# Patient Record
Sex: Male | Born: 1975 | Race: White | Hispanic: No | Smoking: Current every day smoker
Health system: Southern US, Community
[De-identification: ages and names within clinical notes are randomized; demographics above are authoritative.]

## PROBLEM LIST (undated history)

## (undated) ENCOUNTER — Emergency Department: Admission: EM | Payer: Self-pay | Source: Home / Self Care

## (undated) DIAGNOSIS — R51 Headache: Secondary | ICD-10-CM

## (undated) DIAGNOSIS — K089 Disorder of teeth and supporting structures, unspecified: Secondary | ICD-10-CM

## (undated) DIAGNOSIS — M25519 Pain in unspecified shoulder: Secondary | ICD-10-CM

## (undated) DIAGNOSIS — R519 Headache, unspecified: Secondary | ICD-10-CM

## (undated) DIAGNOSIS — G8929 Other chronic pain: Secondary | ICD-10-CM

## (undated) DIAGNOSIS — J45909 Unspecified asthma, uncomplicated: Secondary | ICD-10-CM

## (undated) DIAGNOSIS — F1910 Other psychoactive substance abuse, uncomplicated: Secondary | ICD-10-CM

## (undated) DIAGNOSIS — I2699 Other pulmonary embolism without acute cor pulmonale: Secondary | ICD-10-CM

## (undated) DIAGNOSIS — I82409 Acute embolism and thrombosis of unspecified deep veins of unspecified lower extremity: Secondary | ICD-10-CM

## (undated) DIAGNOSIS — M549 Dorsalgia, unspecified: Secondary | ICD-10-CM

## (undated) HISTORY — PX: TONSILLECTOMY: SUR1361

---

## 2004-10-05 ENCOUNTER — Ambulatory Visit: Payer: Self-pay | Admitting: Unknown Physician Specialty

## 2006-06-01 ENCOUNTER — Other Ambulatory Visit: Payer: Self-pay

## 2006-06-01 ENCOUNTER — Emergency Department: Payer: Self-pay | Admitting: Emergency Medicine

## 2006-06-22 ENCOUNTER — Ambulatory Visit: Payer: Self-pay | Admitting: Surgery

## 2008-08-20 ENCOUNTER — Emergency Department: Payer: Self-pay | Admitting: Emergency Medicine

## 2008-10-20 ENCOUNTER — Emergency Department: Payer: Self-pay | Admitting: Emergency Medicine

## 2008-10-25 ENCOUNTER — Emergency Department: Payer: Self-pay | Admitting: Internal Medicine

## 2009-12-07 ENCOUNTER — Emergency Department: Payer: Self-pay | Admitting: Emergency Medicine

## 2009-12-30 ENCOUNTER — Emergency Department: Payer: Self-pay | Admitting: Emergency Medicine

## 2010-04-02 ENCOUNTER — Emergency Department: Payer: Self-pay | Admitting: Emergency Medicine

## 2010-07-04 ENCOUNTER — Emergency Department: Payer: Self-pay | Admitting: Emergency Medicine

## 2010-07-29 ENCOUNTER — Emergency Department: Payer: Self-pay | Admitting: Emergency Medicine

## 2010-10-20 ENCOUNTER — Emergency Department: Payer: Self-pay | Admitting: Emergency Medicine

## 2010-12-25 ENCOUNTER — Emergency Department: Payer: Self-pay | Admitting: Emergency Medicine

## 2011-03-02 ENCOUNTER — Emergency Department: Payer: Self-pay | Admitting: Unknown Physician Specialty

## 2011-05-03 ENCOUNTER — Emergency Department: Payer: Self-pay | Admitting: Emergency Medicine

## 2011-07-16 ENCOUNTER — Emergency Department: Payer: Self-pay | Admitting: Internal Medicine

## 2011-08-14 ENCOUNTER — Emergency Department: Payer: Self-pay | Admitting: Emergency Medicine

## 2011-09-27 ENCOUNTER — Emergency Department: Payer: Self-pay | Admitting: *Deleted

## 2011-11-17 ENCOUNTER — Emergency Department: Payer: Self-pay | Admitting: Unknown Physician Specialty

## 2012-04-08 ENCOUNTER — Emergency Department (HOSPITAL_COMMUNITY): Payer: Medicaid Other

## 2012-04-08 ENCOUNTER — Emergency Department (HOSPITAL_COMMUNITY)
Admission: EM | Admit: 2012-04-08 | Discharge: 2012-04-08 | Disposition: A | Discharge status: Home / Self Care | Payer: Medicaid Other | Attending: Emergency Medicine | Admitting: Emergency Medicine

## 2012-04-08 ENCOUNTER — Encounter (HOSPITAL_COMMUNITY): Payer: Self-pay | Admitting: Emergency Medicine

## 2012-04-08 DIAGNOSIS — J45909 Unspecified asthma, uncomplicated: Secondary | ICD-10-CM | POA: Insufficient documentation

## 2012-04-08 DIAGNOSIS — F17200 Nicotine dependence, unspecified, uncomplicated: Secondary | ICD-10-CM | POA: Insufficient documentation

## 2012-04-08 DIAGNOSIS — R0789 Other chest pain: Secondary | ICD-10-CM

## 2012-04-08 DIAGNOSIS — R071 Chest pain on breathing: Secondary | ICD-10-CM | POA: Insufficient documentation

## 2012-04-08 HISTORY — DX: Unspecified asthma, uncomplicated: J45.909

## 2012-04-08 LAB — COMPREHENSIVE METABOLIC PANEL
ALT: 41 U/L (ref 0–53)
Alkaline Phosphatase: 144 U/L — ABNORMAL HIGH (ref 39–117)
BUN: 14 mg/dL (ref 6–23)
CO2: 27 mEq/L (ref 19–32)
Chloride: 104 mEq/L (ref 96–112)
GFR calc Af Amer: >90 mL/min (ref >90)
Glucose, Bld: 93 mg/dL (ref 70–99)
Potassium: 4 mEq/L (ref 3.5–5.1)
Sodium: 139 mEq/L (ref 135–145)
Total Bilirubin: 0.2 mg/dL — ABNORMAL LOW (ref 0.3–1.2)

## 2012-04-08 LAB — CBC
HCT: 42.7 % (ref 39.0–52.0)
Hemoglobin: 14.2 g/dL (ref 13.0–17.0)
RBC: 4.85 MIL/uL (ref 4.22–5.81)
WBC: 7.8 10*3/uL (ref 4.0–10.5)

## 2012-04-08 LAB — TROPONIN I: Troponin I: <0.3 ng/mL (ref <0.30)

## 2012-04-08 MED ORDER — PREDNISONE 50 MG PO TABS: 50.0000 mg | ORAL_TABLET | Freq: Every day | ORAL | Status: AC

## 2012-04-08 MED ORDER — DOXYCYCLINE HYCLATE 100 MG PO CAPS: 100.0000 mg | ORAL_CAPSULE | Freq: Two times a day (BID) | ORAL | Status: AC

## 2012-04-08 MED ORDER — OXYCODONE-ACETAMINOPHEN 5-325 MG PO TABS: 1.0000 | ORAL_TABLET | Freq: Four times a day (QID) | ORAL | Status: AC | PRN

## 2012-04-08 MED ORDER — HYDROMORPHONE HCL PF 2 MG/ML IJ SOLN
2.0000 mg | Freq: Once | INTRAMUSCULAR | Status: AC
  Administered 2012-04-08: 2 mg via INTRAMUSCULAR
  Filled 2012-04-08: qty 1

## 2012-04-08 NOTE — ED Provider Notes (Signed)
History  This chart was scribed for Donnetta Hutching, MD by Bennett Scrape. This patient was seen in room APA01/APA01 and the patient's care was started at 11:35AM.  CSN: [id number]  Arrival date & time 04/08/12  1128   First MD Initiated Contact with Patient 04/08/12 1135      Chief Complaint  Patient presents with  . Chest Pain    The history is provided by the patient. No language interpreter was used.    David Leach is a 36 y.o. male who presents to the Emergency Department complaining of 2 days of gradual onset, non-changing, constant mid sternal CP while he was trimming trees on his roof. He states that the pain started while was laying across his roof cutting tree limbs when he reached for a tree branch and another branch caught him in the sternum. The pain is non-radiating and is worse with deep breaths.  Pt reports that he broke his sternum falling out of deer stand 8 years ago and states that the pain he is experiencing now is similar to the pain he experienced then. He denies taking OTC medications at home to improve symptoms. He also c/o chronic SOB from asthma but denies changes and reports concern over possible contact with lyme disease infected ticks after he went fishing with son and removed several ticks from arms, legs and stomach 3 days ago. He denies fever, neck pain, visual disturbance, cough, abdominal pain, urinary symptoms, back pain, HA and rash as associated symptoms. He is a current everyday smoker but denies alcohol use.  Past Medical History  Diagnosis Date  . Asthma     Past Surgical History  Procedure Date  . Tonsillectomy     Family History  Problem Relation Age of Onset  . Cancer Mother   . Diabetes Father   . Cancer Other   . Stroke Other   . Diabetes Other     History  Substance Use Topics  . Smoking status: Current Everyday Smoker -- 1.0 packs/day for 20 years    Types: Cigarettes  . Smokeless tobacco: Former Neurosurgeon  . Alcohol Use: No    Administrator, sports, recently laid off   Review of Systems  A complete 10 system review of systems was obtained and all systems are negative except as noted in the HPI and PMH.   Allergies  Review of patient's allergies indicates no known allergies.  Home Medications  No current outpatient prescriptions on file.  Triage Vitals: BP 131/66  Pulse 84  Resp 18  SpO2 96%  Physical Exam  Nursing note and vitals reviewed. Constitutional: He is oriented to person, place, and time. He appears well-developed and well-nourished. No distress.  HENT:  Head: Normocephalic and atraumatic.  Eyes: Conjunctivae and EOM are normal.  Neck: Neck supple. No tracheal deviation present.  Cardiovascular: Normal rate and regular rhythm.   Pulmonary/Chest: Effort normal and breath sounds normal. No respiratory distress. He exhibits tenderness (moderate tenderness to mid sternum ).  Abdominal: Soft. There is no tenderness.  Musculoskeletal: Normal range of motion. He exhibits no edema.  Neurological: He is alert and oriented to person, place, and time.  Skin: Skin is warm and dry.       Multiple punctate erythematous areas to abdomen and bilateral forearms consistent with tick bites  Psychiatric: He has a normal mood and affect. His behavior is normal.    ED Course  Procedures (including critical care time)  DIAGNOSTIC STUDIES: Oxygen Saturation is 96% on  room air, adequate by my interpretation.    COORDINATION OF CARE: 11:50AM-Discussed treatment plan which includes a CXR, prednisone and pain medication with pt at bedside and pt agreed to plan. Advised pt that symptoms will take one week to improve. Discussed antibiotics for tick bites and pt agreed to plan.    Labs Reviewed  COMPREHENSIVE METABOLIC PANEL - Abnormal; Notable for the following:    Alkaline Phosphatase 144 (*)     Total Bilirubin 0.2 (*)     GFR calc non Af Amer 85 (*)     All other components within normal limits  CBC   TROPONIN I   Dg Chest 2 View  04/08/2012  *RADIOLOGY REPORT*  Clinical Data: Chest pain.  CHEST - 2 VIEW  Comparison: Chest x-ray 01/02/2012.  Findings: Lung volumes are normal.  No consolidative airspace disease.  No pleural effusions.  No pneumothorax.  No pulmonary nodule or mass noted.  Pulmonary vasculature and the cardiomediastinal silhouette are within normal limits.  IMPRESSION: 1. No radiographic evidence of acute cardiopulmonary disease.  Original Report Authenticated By: Florencia Reasons, M.D.     No diagnosis found.  Date: 04/08/2012  Rate:83  Rhythm: normal sinus rhythm  QRS Axis: normal  Intervals: normal  ST/T Wave abnormalities: normal  Conduction Disutrbances: none  Narrative Interpretation: unremarkable      MDM  History and physical consistent with chest wall pain. Patient is tender over the sternum.  Symptoms started directly after accident..  History and physical not consistent with acute coronary syndrome  I personally performed the services described in this documentation, which was scribed in my presence. The recorded information has been reviewed and considered.       Donnetta Hutching, MD 04/08/12 1350

## 2012-04-08 NOTE — ED Notes (Signed)
Patient c/o non-radiating mid-sternal chest pain that started 2 days ago while cutting limb from tree. Patient reports shortness of breath. Per patient fractured sternum bone 8 years ago after falling out of tree stand.

## 2012-05-21 ENCOUNTER — Emergency Department (HOSPITAL_COMMUNITY)
Admission: EM | Admit: 2012-05-21 | Discharge: 2012-05-22 | Disposition: A | Discharge status: Home / Self Care | Payer: Medicaid Other | Attending: Emergency Medicine | Admitting: Emergency Medicine

## 2012-05-21 ENCOUNTER — Encounter (HOSPITAL_COMMUNITY): Payer: Self-pay | Admitting: *Deleted

## 2012-05-21 DIAGNOSIS — W268XXA Contact with other sharp object(s), not elsewhere classified, initial encounter: Secondary | ICD-10-CM | POA: Insufficient documentation

## 2012-05-21 DIAGNOSIS — S71109A Unspecified open wound, unspecified thigh, initial encounter: Secondary | ICD-10-CM | POA: Insufficient documentation

## 2012-05-21 DIAGNOSIS — F17200 Nicotine dependence, unspecified, uncomplicated: Secondary | ICD-10-CM | POA: Insufficient documentation

## 2012-05-21 DIAGNOSIS — Y998 Other external cause status: Secondary | ICD-10-CM | POA: Insufficient documentation

## 2012-05-21 DIAGNOSIS — Y9389 Activity, other specified: Secondary | ICD-10-CM | POA: Insufficient documentation

## 2012-05-21 DIAGNOSIS — IMO0002 Reserved for concepts with insufficient information to code with codable children: Secondary | ICD-10-CM

## 2012-05-21 DIAGNOSIS — J45909 Unspecified asthma, uncomplicated: Secondary | ICD-10-CM | POA: Insufficient documentation

## 2012-05-21 DIAGNOSIS — S71009A Unspecified open wound, unspecified hip, initial encounter: Secondary | ICD-10-CM | POA: Insufficient documentation

## 2012-05-21 MED ORDER — OXYCODONE-ACETAMINOPHEN 5-325 MG PO TABS
2.0000 | ORAL_TABLET | Freq: Once | ORAL | Status: AC
  Administered 2012-05-22: 2 via ORAL
  Filled 2012-05-21: qty 2

## 2012-05-21 MED ORDER — LIDOCAINE HCL (PF) 1 % IJ SOLN
INTRAMUSCULAR | Status: AC
  Administered 2012-05-21: 5 mL
  Filled 2012-05-21: qty 5

## 2012-05-21 MED ORDER — LIDOCAINE HCL (PF) 1 % IJ SOLN
5.0000 mL | Freq: Once | INTRAMUSCULAR | Status: AC
  Administered 2012-05-21: 5 mL
  Filled 2012-05-21: qty 5

## 2012-05-21 MED ORDER — SULFAMETHOXAZOLE-TMP DS 800-160 MG PO TABS
1.0000 | ORAL_TABLET | Freq: Once | ORAL | Status: AC
  Administered 2012-05-22: 1 via ORAL
  Filled 2012-05-21: qty 1

## 2012-05-21 NOTE — ED Provider Notes (Signed)
History     CSN: [id number]  Arrival date & time 05/21/12  2243   First MD Initiated Contact with Patient 05/21/12 2256      Chief Complaint  Patient presents with  . Laceration    (Consider location/radiation/quality/duration/timing/severity/associated sxs/prior treatment) HPI Comments: Patient c/o laceration to his left upper leg that occurred when he accidentally dropped a tiller and one of the tines on the tiller cut his leg.  Denies persistent bleeding, edema or numbness/weakness of the leg.  Pain is worse with weight bearing .  Cleaned the area PTA.    Patient is a 36 y.o. male presenting with skin laceration. The history is provided by the patient.  Laceration  The incident occurred 1 to 2 hours ago. Pain location: left upper leg. The laceration is 2 cm in size. The laceration mechanism was a a metal edge. The pain is moderate. The pain has been constant since onset. He reports no foreign bodies present. His tetanus status is UTD.    Past Medical History  Diagnosis Date  . Asthma     Past Surgical History  Procedure Date  . Tonsillectomy     Family History  Problem Relation Age of Onset  . Cancer Mother   . Diabetes Father   . Cancer Other   . Stroke Other   . Diabetes Other     History  Substance Use Topics  . Smoking status: Current Every Day Smoker -- 1.0 packs/day for 20 years    Types: Cigarettes  . Smokeless tobacco: Former Neurosurgeon  . Alcohol Use: No      Review of Systems  Constitutional: Negative for fever and chills.  Musculoskeletal: Negative for back pain, joint swelling and arthralgias.  Skin: Positive for wound. Negative for color change.       Laceration   Neurological: Negative for dizziness, weakness and numbness.  Hematological: Does not bruise/bleed easily.  All other systems reviewed and are negative.    Allergies  Review of patient's allergies indicates no known allergies.  Home Medications   Current Outpatient Rx  Name Route  Sig Dispense Refill  . ALBUTEROL SULFATE HFA 108 (90 BASE) MCG/ACT IN AERS Inhalation Inhale 2 puffs into the lungs every 6 (six) hours as needed. For shortness of breath    . BC HEADACHE POWDER PO Oral Take 1 Package by mouth daily as needed. For pain    . IBUPROFEN 200 MG PO TABS Oral Take 800 mg by mouth every 6 (six) hours as needed. For pain      BP 131/59  Pulse 101  Temp 97.8 F (36.6 C)  Resp 20  Wt 200 lb (90.719 kg)  SpO2 96%  Physical Exam  Nursing note and vitals reviewed. Constitutional: He is oriented to person, place, and time. He appears well-developed and well-nourished. No distress.  HENT:  Head: Normocephalic and atraumatic.  Cardiovascular: Normal rate, regular rhythm and normal heart sounds.   Pulmonary/Chest: Effort normal and breath sounds normal.  Musculoskeletal: He exhibits no edema and no tenderness.       Left hip: He exhibits normal range of motion, normal strength, no tenderness, no bony tenderness, no swelling, no crepitus and no deformity.       Left knee: Normal.  Neurological: He is alert and oriented to person, place, and time. He exhibits normal muscle tone. Coordination normal.  Skin: Laceration noted.          Laceration to the left medial thigh.  Adipose tissue seen.  No obvious FB's, bleeding controlled.  No edema.  No obvious muscle, tendon or nerve injury seen or palpated.  Distal sensation intact, DP pulse brisk.  Pt has full ROM of the left leg.    ED Course  Procedures (including critical care time)  Labs Reviewed - No data to display      MDM    LACERATION REPAIR Performed by: Naketa Daddario L. Authorized by: Maxwell Caul Consent: Verbal consent obtained. Risks and benefits: risks, benefits and alternatives were discussed Consent given by: patient Patient identity confirmed: provided demographic data Prepped and Draped in normal sterile fashion Wound explored  Laceration Location:  Left medial thigh Laceration  Length: 2 cm  No Foreign Bodies seen or palpated  Anesthesia: local infiltration  Local anesthetic: lidocaine 1% w/o epinephrine  Anesthetic total: 5 ml  Irrigation method: syringe Amount of cleaning: standard  Skin closure: 4-0 prolene Number of sutures: 3  Technique: simple interrupted (loosely closed)  Patient tolerance: Patient tolerated the procedure well with no immediate complications.    Wound was thoroughly irrigated with saline and skin loosely approximated.  I have advised pt to return here in 1-2 days if any signs of infection.  Wound(s) explored with adequate hemostasis through ROM, no apparent gross foreign body retained, no significant involvement of deep structures such as bone / joint / tendon / or neurovascular involvement noted.  Baseline Strength and Sensation to affected extremity(ies) with normal light touch for Pt, distal NVI with CR< 2 secs and pulse(s) intact to affected extremity(ies).   The patient appears reasonably screened and/or stabilized for discharge and I doubt any other medical condition or other [facility] requiring further screening, evaluation, or treatment in the ED at this time prior to discharge.    Aliegha Paullin L. [place], [place] 05/23/12 2049

## 2012-05-21 NOTE — ED Notes (Signed)
Pt dropped a tiller on his leg. Pt has 1 inch laceration to left inner thigh.

## 2012-05-22 MED ORDER — OXYCODONE-ACETAMINOPHEN 5-325 MG PO TABS: 1.0000 | ORAL_TABLET | ORAL | Status: AC | PRN

## 2012-05-22 MED ORDER — SULFAMETHOXAZOLE-TRIMETHOPRIM 800-160 MG PO TABS: 1.0000 | ORAL_TABLET | Freq: Two times a day (BID) | ORAL | Status: DC

## 2012-05-22 NOTE — ED Notes (Signed)
Pt discharged. Pt stable at time of discharge. Medications reviewed pt has no questions regarding discharge at this time. Pt voiced understanding of discharge instructions.  

## 2012-05-24 NOTE — ED Provider Notes (Signed)
Medical screening examination/treatment/procedure(s) were performed by non-physician practitioner and as supervising physician I was immediately available for consultation/collaboration.  Donnetta Hutching, MD 05/24/12 [phone]

## 2012-08-07 ENCOUNTER — Encounter (HOSPITAL_COMMUNITY): Payer: Self-pay | Admitting: *Deleted

## 2012-08-07 ENCOUNTER — Emergency Department (HOSPITAL_COMMUNITY)
Admission: EM | Admit: 2012-08-07 | Discharge: 2012-08-07 | Disposition: A | Discharge status: Home / Self Care | Payer: Medicaid Other | Attending: Emergency Medicine | Admitting: Emergency Medicine

## 2012-08-07 ENCOUNTER — Emergency Department (HOSPITAL_COMMUNITY): Payer: Medicaid Other

## 2012-08-07 DIAGNOSIS — S161XXA Strain of muscle, fascia and tendon at neck level, initial encounter: Secondary | ICD-10-CM

## 2012-08-07 DIAGNOSIS — Y9389 Activity, other specified: Secondary | ICD-10-CM | POA: Insufficient documentation

## 2012-08-07 DIAGNOSIS — Y929 Unspecified place or not applicable: Secondary | ICD-10-CM | POA: Insufficient documentation

## 2012-08-07 DIAGNOSIS — S20219A Contusion of unspecified front wall of thorax, initial encounter: Secondary | ICD-10-CM

## 2012-08-07 DIAGNOSIS — W19XXXA Unspecified fall, initial encounter: Secondary | ICD-10-CM

## 2012-08-07 DIAGNOSIS — R296 Repeated falls: Secondary | ICD-10-CM | POA: Insufficient documentation

## 2012-08-07 DIAGNOSIS — J45909 Unspecified asthma, uncomplicated: Secondary | ICD-10-CM | POA: Insufficient documentation

## 2012-08-07 DIAGNOSIS — F17200 Nicotine dependence, unspecified, uncomplicated: Secondary | ICD-10-CM | POA: Insufficient documentation

## 2012-08-07 DIAGNOSIS — Z79899 Other long term (current) drug therapy: Secondary | ICD-10-CM | POA: Insufficient documentation

## 2012-08-07 DIAGNOSIS — S139XXA Sprain of joints and ligaments of unspecified parts of neck, initial encounter: Secondary | ICD-10-CM | POA: Insufficient documentation

## 2012-08-07 MED ORDER — OXYCODONE-ACETAMINOPHEN 5-325 MG PO TABS
2.0000 | ORAL_TABLET | Freq: Once | ORAL | Status: AC
  Administered 2012-08-07: 2 via ORAL
  Filled 2012-08-07: qty 2

## 2012-08-07 MED ORDER — NAPROXEN 500 MG PO TABS: 500.0000 mg | ORAL_TABLET | Freq: Two times a day (BID) | ORAL | Status: DC

## 2012-08-07 MED ORDER — KETOROLAC TROMETHAMINE 60 MG/2ML IM SOLN
60.0000 mg | Freq: Once | INTRAMUSCULAR | Status: AC
  Administered 2012-08-07: 60 mg via INTRAMUSCULAR
  Filled 2012-08-07: qty 2

## 2012-08-07 MED ORDER — CYCLOBENZAPRINE HCL 10 MG PO TABS: 10.0000 mg | ORAL_TABLET | Freq: Three times a day (TID) | ORAL | Status: DC | PRN

## 2012-08-07 MED ORDER — CYCLOBENZAPRINE HCL 10 MG PO TABS
10.0000 mg | ORAL_TABLET | Freq: Once | ORAL | Status: AC
  Administered 2012-08-07: 10 mg via ORAL
  Filled 2012-08-07: qty 1

## 2012-08-07 MED ORDER — OXYCODONE-ACETAMINOPHEN 5-325 MG PO TABS: 1.0000 | ORAL_TABLET | ORAL | Status: AC | PRN

## 2012-08-07 NOTE — ED Notes (Signed)
Pain upper back and neck , Fell when going up steps carrying a Christmas tree.  Larey Seat backwards and struck his back against  A corner.

## 2012-08-07 NOTE — ED Notes (Signed)
Pt states fell while bringing christmas tree in house falling into a 6 x 6 post striking rt shoulder and neck. Pt denies loc. NAD noted at this time.

## 2012-08-10 NOTE — ED Provider Notes (Signed)
History     CSN: [id number]  Arrival date & time 08/07/12  1718   First MD Initiated Contact with Patient 08/07/12 1831      Chief Complaint  Patient presents with  . Neck Pain    (Consider location/radiation/quality/duration/timing/severity/associated sxs/prior treatment) Patient is a 36 y.o. male presenting with neck pain. The history is provided by the patient.  Neck Pain  This is a new problem. The current episode started 3 to 5 hours ago. The problem occurs constantly. The problem has not changed since onset.The pain is associated with a fall, twisting and lifting a heavy object. There has been no fever. The pain is present in the right side. The quality of the pain is described as aching. The pain radiates to the right shoulder (right chest wall). The pain is moderate. The symptoms are aggravated by twisting, position and bending. Associated symptoms include chest pain. Pertinent negatives include no photophobia, no visual change, no syncope, no numbness, no weight loss, no headaches, no bowel incontinence, no bladder incontinence, no leg pain, no paresis, no tingling and no weakness. He has tried NSAIDs for the symptoms. The treatment provided no relief.    Past Medical History  Diagnosis Date  . Asthma     Past Surgical History  Procedure Date  . Tonsillectomy     Family History  Problem Relation Age of Onset  . Cancer Mother   . Diabetes Father   . Cancer Other   . Stroke Other   . Diabetes Other     History  Substance Use Topics  . Smoking status: Current Every Day Smoker -- 1.0 packs/day for 20 years    Types: Cigarettes  . Smokeless tobacco: Former Neurosurgeon  . Alcohol Use: No      Review of Systems  Constitutional: Negative for fever and weight loss.  HENT: Positive for neck pain. Negative for facial swelling.   Eyes: Negative for photophobia and visual disturbance.  Respiratory: Negative for shortness of breath.   Cardiovascular: Positive for chest pain.  Negative for syncope.  Gastrointestinal: Negative for vomiting, abdominal pain, constipation and bowel incontinence.  Genitourinary: Negative for bladder incontinence, dysuria, hematuria, flank pain, decreased urine volume and difficulty urinating.       No perineal numbness or incontinence of urine or feces  Musculoskeletal: Positive for back pain and arthralgias. Negative for joint swelling and gait problem.  Skin: Negative for rash.  Neurological: Negative for dizziness, tingling, weakness, numbness and headaches.  All other systems reviewed and are negative.    Allergies  Review of patient's allergies indicates no known allergies.  Home Medications   Current Outpatient Rx  Name  Route  Sig  Dispense  Refill  . ALBUTEROL SULFATE HFA 108 (90 BASE) MCG/ACT IN AERS   Inhalation   Inhale 2 puffs into the lungs every 6 (six) hours as needed. For shortness of breath         . BC HEADACHE POWDER PO   Oral   Take 1 Package by mouth daily as needed. For pain         . IBUPROFEN 200 MG PO TABS   Oral   Take 800 mg by mouth every 6 (six) hours as needed. For pain         . TRAMADOL HCL 50 MG PO TABS   Oral   Take 50 mg by mouth every 6 (six) hours as needed. For pain         . CYCLOBENZAPRINE HCL  10 MG PO TABS   Oral   Take 1 tablet (10 mg total) by mouth 3 (three) times daily as needed for muscle spasms.   21 tablet   0   . NAPROXEN 500 MG PO TABS   Oral   Take 1 tablet (500 mg total) by mouth 2 (two) times daily with a meal.   20 tablet   0   . OXYCODONE-ACETAMINOPHEN 5-325 MG PO TABS   Oral   Take 1 tablet by mouth every 4 (four) hours as needed for pain.   20 tablet   0     BP 124/91  Pulse 96  Temp 98.4 F (36.9 C) (Oral)  Resp 20  Ht 5\' 11"  (1.803 m)  Wt 205 lb (92.987 kg)  BMI 28.59 kg/m2  SpO2 100%  Physical Exam  Nursing note and vitals reviewed. Constitutional: He is oriented to person, place, and time. He appears well-developed and  well-nourished. No distress.  HENT:  Head: Normocephalic and atraumatic.  Mouth/Throat: Oropharynx is clear and moist.  Eyes: EOM are normal. Pupils are equal, round, and reactive to light.  Neck: Phonation normal. Neck supple. Muscular tenderness present. No spinous process tenderness present. No rigidity. Decreased range of motion present. No erythema present. No Brudzinski's sign and no Kernig's sign noted. No thyromegaly present.       ttp of the right cervical paraspinal muscles.  Grip strength is strong and equal bilaterally.  Sensation intact,  CR < 2 sec  Cardiovascular: Normal rate, regular rhythm, normal heart sounds and intact distal pulses.   No murmur heard. Pulmonary/Chest: Effort normal and breath sounds normal. No respiratory distress. He exhibits tenderness.       Mild ttp of the right lateral ribs.  No guarding, edema abrasions or crepitus  Abdominal: Soft. There is no tenderness.  Musculoskeletal: He exhibits tenderness. He exhibits no edema.       Right shoulder: He exhibits tenderness and pain. He exhibits normal range of motion, no bony tenderness, no swelling, no effusion, no crepitus, no deformity, no laceration, normal pulse and normal strength.       Cervical back: He exhibits tenderness. He exhibits normal range of motion, no bony tenderness, no swelling, no deformity, no spasm and normal pulse.       Arms:      ttp along border of the right scapula.  Pain reproduced with abduction of right arm.  Radial pulse brisk, distal sensation intact, grip strength strong and equal bilaterally  Lymphadenopathy:    He has no cervical adenopathy.  Neurological: He is alert and oriented to person, place, and time. He has normal strength. No cranial nerve deficit or sensory deficit. He exhibits normal muscle tone. Coordination normal.  Reflex Scores:      Tricep reflexes are 2+ on the right side and 2+ on the left side.      Bicep reflexes are 2+ on the right side and 2+ on the left  side. Skin: Skin is warm and dry.    ED Course  Procedures (including critical care time)  Labs Reviewed - No data to display No results found. Dg Ribs Unilateral W/chest Right  08/07/2012  *RADIOLOGY REPORT*  Clinical Data: Neck pain.  Fall.  Chest pain.  RIGHT RIBS AND CHEST - 3+ VIEW  Comparison: None.  Findings: No pneumothorax.  Cardiopericardial silhouette within normal limits. Mediastinal contours normal. Trachea midline.  No airspace disease or effusion.  No displaced rib fractures are identified. Please note  that plain film rib series have limited sensitivity for nondisplaced rib fractures.  If the patient continues to have pain, consider repeat examination in 1-2 weeks with marker over area of maximal tenderness.  Often periosteal reaction will be present at the site of occult rib fracture.  IMPRESSION: No acute abnormality.   Original Report Authenticated By: Andreas Newport, M.D.    Dg Cervical Spine Complete  08/07/2012  *RADIOLOGY REPORT*  Clinical Data:  neck pain.  CERVICAL SPINE - COMPLETE 4+ VIEW  Comparison: None.  Findings: The odontoid is intact.  Cervical spine is visualized from C1-C7.  The cervicothoracic junction is inadequately visualized.  Prevertebral soft tissues appear normal.  Mild degenerative disc disease at C6-C7.  Craniocervical alignment appears normal.  IMPRESSION: No acute osseous abnormality in the visualized cervical spine.  C6- C7 degenerative disc disease.  Cervicothoracic junction adequately visualized despite attempted swimmer's view.   Original Report Authenticated By: Andreas Newport, M.D.    Dg Scapula Right  08/07/2012  *RADIOLOGY REPORT*  Clinical Data: Neck pain.  Fall.  RIGHT SCAPULA - 2+ VIEWS  Comparison: 04/08/2012.  Findings: Right scapula intact.  No fracture.  Shoulder appears located.  AC joint appears within normal limits.  IMPRESSION: Negative.   Original Report Authenticated By: Andreas Newport, M.D.     1. Cervical strain   2. Chest wall  contusion   3. Fall       MDM     No focal neuro deficits.  Pt agrees to ice and close f/u with his PMD.    Prescribed: Flexeril Percocet #20 naprosyn     Baylon Santelli L. [place], [place] 08/10/12 2238

## 2012-08-11 NOTE — ED Provider Notes (Signed)
Medical screening examination/treatment/procedure(s) were performed by non-physician practitioner and as supervising physician I was immediately available for consultation/collaboration.  Donnetta Hutching, MD 08/11/12 [phone]

## 2012-08-19 ENCOUNTER — Emergency Department: Payer: Self-pay | Admitting: Internal Medicine

## 2012-08-19 LAB — CBC
HCT: 46.8 % (ref 40.0–52.0)
HGB: 16 g/dL (ref 13.0–18.0)
MCH: 30.5 pg (ref 26.0–34.0)
MCV: 89 fL (ref 80–100)
RBC: 5.25 10*6/uL (ref 4.40–5.90)
RDW: 14 % (ref 11.5–14.5)

## 2012-08-19 LAB — COMPREHENSIVE METABOLIC PANEL
Albumin: 3.9 g/dL (ref 3.4–5.0)
BUN: 11 mg/dL (ref 7–18)
Bilirubin,Total: 0.5 mg/dL (ref 0.2–1.0)
Calcium, Total: 8.8 mg/dL (ref 8.5–10.1)
Co2: 25 mmol/L (ref 21–32)
Creatinine: 1.36 mg/dL — ABNORMAL HIGH (ref 0.60–1.30)
EGFR (African American): >60
EGFR (Non-African Amer.): >60
Glucose: 97 mg/dL (ref 65–99)
Osmolality: 275 (ref 275–301)
Potassium: 3.9 mmol/L (ref 3.5–5.1)
Sodium: 138 mmol/L (ref 136–145)
Total Protein: 7.4 g/dL (ref 6.4–8.2)

## 2012-10-20 ENCOUNTER — Encounter (HOSPITAL_COMMUNITY): Payer: Self-pay | Admitting: Emergency Medicine

## 2012-10-20 ENCOUNTER — Emergency Department (HOSPITAL_COMMUNITY)
Admission: EM | Admit: 2012-10-20 | Discharge: 2012-10-20 | Disposition: A | Discharge status: Home / Self Care | Payer: Medicaid Other | Attending: Emergency Medicine | Admitting: Emergency Medicine

## 2012-10-20 ENCOUNTER — Emergency Department (HOSPITAL_COMMUNITY): Payer: Medicaid Other

## 2012-10-20 DIAGNOSIS — X500XXA Overexertion from strenuous movement or load, initial encounter: Secondary | ICD-10-CM | POA: Insufficient documentation

## 2012-10-20 DIAGNOSIS — IMO0002 Reserved for concepts with insufficient information to code with codable children: Secondary | ICD-10-CM | POA: Insufficient documentation

## 2012-10-20 DIAGNOSIS — F17200 Nicotine dependence, unspecified, uncomplicated: Secondary | ICD-10-CM | POA: Insufficient documentation

## 2012-10-20 DIAGNOSIS — Y92410 Unspecified street and highway as the place of occurrence of the external cause: Secondary | ICD-10-CM | POA: Insufficient documentation

## 2012-10-20 DIAGNOSIS — Y9389 Activity, other specified: Secondary | ICD-10-CM | POA: Insufficient documentation

## 2012-10-20 DIAGNOSIS — Z79899 Other long term (current) drug therapy: Secondary | ICD-10-CM | POA: Insufficient documentation

## 2012-10-20 DIAGNOSIS — Z7982 Long term (current) use of aspirin: Secondary | ICD-10-CM | POA: Insufficient documentation

## 2012-10-20 DIAGNOSIS — S93409A Sprain of unspecified ligament of unspecified ankle, initial encounter: Secondary | ICD-10-CM | POA: Insufficient documentation

## 2012-10-20 DIAGNOSIS — J45909 Unspecified asthma, uncomplicated: Secondary | ICD-10-CM | POA: Insufficient documentation

## 2012-10-20 MED ORDER — HYDROCODONE-ACETAMINOPHEN 5-325 MG PO TABS: 1.0000 | ORAL_TABLET | ORAL | Status: DC | PRN

## 2012-10-20 MED ORDER — HYDROCODONE-ACETAMINOPHEN 5-325 MG PO TABS
1.0000 | ORAL_TABLET | Freq: Once | ORAL | Status: AC
  Administered 2012-10-20: 1 via ORAL
  Filled 2012-10-20: qty 1

## 2012-10-20 NOTE — ED Provider Notes (Signed)
Medical screening examination/treatment/procedure(s) were performed by non-physician practitioner and as supervising physician I was immediately available for consultation/collaboration.   Benny Lennert, MD 10/20/12 2107
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
## 2012-11-10 ENCOUNTER — Emergency Department (HOSPITAL_COMMUNITY): Payer: Medicaid Other

## 2012-11-10 ENCOUNTER — Emergency Department (HOSPITAL_COMMUNITY)
Admission: EM | Admit: 2012-11-10 | Discharge: 2012-11-10 | Disposition: A | Discharge status: Home / Self Care | Payer: Medicaid Other | Attending: Emergency Medicine | Admitting: Emergency Medicine

## 2012-11-10 ENCOUNTER — Encounter (HOSPITAL_COMMUNITY): Payer: Self-pay | Admitting: *Deleted

## 2012-11-10 DIAGNOSIS — F17200 Nicotine dependence, unspecified, uncomplicated: Secondary | ICD-10-CM | POA: Insufficient documentation

## 2012-11-10 DIAGNOSIS — Z79899 Other long term (current) drug therapy: Secondary | ICD-10-CM | POA: Insufficient documentation

## 2012-11-10 DIAGNOSIS — R071 Chest pain on breathing: Secondary | ICD-10-CM | POA: Insufficient documentation

## 2012-11-10 DIAGNOSIS — R0789 Other chest pain: Secondary | ICD-10-CM

## 2012-11-10 DIAGNOSIS — J45901 Unspecified asthma with (acute) exacerbation: Secondary | ICD-10-CM | POA: Insufficient documentation

## 2012-11-10 DIAGNOSIS — R059 Cough, unspecified: Secondary | ICD-10-CM | POA: Insufficient documentation

## 2012-11-10 DIAGNOSIS — F419 Anxiety disorder, unspecified: Secondary | ICD-10-CM

## 2012-11-10 DIAGNOSIS — R05 Cough: Secondary | ICD-10-CM | POA: Insufficient documentation

## 2012-11-10 DIAGNOSIS — F411 Generalized anxiety disorder: Secondary | ICD-10-CM | POA: Insufficient documentation

## 2012-11-10 LAB — CBC WITH DIFFERENTIAL/PLATELET
Basophils Absolute: 0 10*3/uL (ref 0.0–0.1)
Basophils Relative: 0 % (ref 0–1)
HCT: 44.3 % (ref 39.0–52.0)
Hemoglobin: 15.1 g/dL (ref 13.0–17.0)
Lymphocytes Relative: 23 % (ref 12–46)
MCHC: 34.1 g/dL (ref 30.0–36.0)
Neutro Abs: 7.5 10*3/uL (ref 1.7–7.7)
Neutrophils Relative %: 63 % (ref 43–77)
RDW: 13.5 % (ref 11.5–15.5)
WBC: 11.9 10*3/uL — ABNORMAL HIGH (ref 4.0–10.5)

## 2012-11-10 LAB — D-DIMER, QUANTITATIVE: D-Dimer, Quant: <0.27 ug/mL-FEU (ref 0.00–0.48)

## 2012-11-10 LAB — BASIC METABOLIC PANEL
Chloride: 101 mEq/L (ref 96–112)
GFR calc Af Amer: 87 mL/min — ABNORMAL LOW (ref >90)
GFR calc non Af Amer: 75 mL/min — ABNORMAL LOW (ref >90)
Potassium: 3.6 mEq/L (ref 3.5–5.1)
Sodium: 139 mEq/L (ref 135–145)

## 2012-11-10 LAB — TROPONIN I: Troponin I: <0.3 ng/mL (ref <0.30)

## 2012-11-10 MED ORDER — OXYCODONE-ACETAMINOPHEN 5-325 MG PO TABS: ORAL_TABLET | ORAL | Status: DC

## 2012-11-10 MED ORDER — ALBUTEROL SULFATE (2.5 MG/3ML) 0.083% IN NEBU: 2.5000 mg | INHALATION_SOLUTION | RESPIRATORY_TRACT | Status: DC | PRN

## 2012-11-10 MED ORDER — IBUPROFEN 400 MG PO TABS
400.0000 mg | ORAL_TABLET | Freq: Once | ORAL | Status: AC
  Administered 2012-11-10: 400 mg via ORAL
  Filled 2012-11-10: qty 1

## 2012-11-10 MED ORDER — OXYCODONE-ACETAMINOPHEN 5-325 MG PO TABS
2.0000 | ORAL_TABLET | Freq: Once | ORAL | Status: AC
  Administered 2012-11-10: 2 via ORAL
  Filled 2012-11-10: qty 2

## 2012-11-10 MED ORDER — IPRATROPIUM BROMIDE 0.02 % IN SOLN
1.0000 mg | Freq: Once | RESPIRATORY_TRACT | Status: AC
  Administered 2012-11-10: 1 mg via RESPIRATORY_TRACT
  Filled 2012-11-10: qty 5

## 2012-11-10 MED ORDER — HYDROMORPHONE HCL PF 1 MG/ML IJ SOLN
1.0000 mg | Freq: Once | INTRAMUSCULAR | Status: AC
  Administered 2012-11-10: 1 mg via INTRAMUSCULAR
  Filled 2012-11-10: qty 1

## 2012-11-10 MED ORDER — PREDNISONE 10 MG PO TABS
60.0000 mg | ORAL_TABLET | Freq: Once | ORAL | Status: AC
  Administered 2012-11-10: 60 mg via ORAL
  Filled 2012-11-10: qty 1

## 2012-11-10 MED ORDER — PREDNISONE 10 MG PO TABS
ORAL_TABLET | ORAL | Status: AC
  Filled 2012-11-10: qty 1

## 2012-11-10 MED ORDER — LORAZEPAM 1 MG PO TABS: ORAL_TABLET | ORAL | Status: DC

## 2012-11-10 MED ORDER — LORAZEPAM 1 MG PO TABS
1.0000 mg | ORAL_TABLET | Freq: Once | ORAL | Status: AC
Start: 2012-11-10 — End: 2012-11-10
  Administered 2012-11-10: 1 mg via ORAL
  Filled 2012-11-10: qty 1

## 2012-11-10 MED ORDER — NAPROXEN 250 MG PO TABS: 250.0000 mg | ORAL_TABLET | Freq: Two times a day (BID) | ORAL | Status: DC

## 2012-11-10 MED ORDER — PREDNISONE 20 MG PO TABS: 40.0000 mg | ORAL_TABLET | Freq: Every day | ORAL | Status: DC

## 2012-11-10 MED ORDER — ALBUTEROL SULFATE (5 MG/ML) 0.5% IN NEBU
10.0000 mg | INHALATION_SOLUTION | Freq: Once | RESPIRATORY_TRACT | Status: AC
  Administered 2012-11-10: 10 mg via RESPIRATORY_TRACT
  Filled 2012-11-10: qty 2

## 2012-11-10 NOTE — ED Notes (Signed)
Left in c/o mother for transport home; instructions, prescriptions and f/u information given/reviewed - verbalizes understanding.  

## 2012-11-10 NOTE — ED Notes (Signed)
Pt states sharp pain to chest x 3 days. Nonproductive cough x 3 days. Pt also states he has been under a lot of stress. Pt states fingers to right hand began tingling last night and felt like he was going to pass out (while coughing).

## 2012-11-10 NOTE — ED Notes (Signed)
Alert, sitting on stretcher; in no apparent distress.  Reports chest pain 8/10. States, "that percocet took the edge off, but I'm still hurting.  Is she going to write me a prescription to go home with? Can she write me something stronger than percocet?".  Will notify EDP.
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
## 2012-11-10 NOTE — ED Notes (Signed)
Patient's family member to nurses station and reports that he is having "severe" chest pains, MD notified.

## 2013-01-27 ENCOUNTER — Encounter (HOSPITAL_COMMUNITY): Payer: Self-pay | Admitting: *Deleted

## 2013-01-27 ENCOUNTER — Emergency Department (HOSPITAL_COMMUNITY)
Admission: EM | Admit: 2013-01-27 | Discharge: 2013-01-28 | Disposition: A | Discharge status: Home / Self Care | Payer: Medicaid Other | Attending: Emergency Medicine | Admitting: Emergency Medicine

## 2013-01-27 DIAGNOSIS — R110 Nausea: Secondary | ICD-10-CM | POA: Insufficient documentation

## 2013-01-27 DIAGNOSIS — N5082 Scrotal pain: Secondary | ICD-10-CM

## 2013-01-27 DIAGNOSIS — R109 Unspecified abdominal pain: Secondary | ICD-10-CM | POA: Insufficient documentation

## 2013-01-27 DIAGNOSIS — M79609 Pain in unspecified limb: Secondary | ICD-10-CM | POA: Insufficient documentation

## 2013-01-27 DIAGNOSIS — J45909 Unspecified asthma, uncomplicated: Secondary | ICD-10-CM | POA: Insufficient documentation

## 2013-01-27 DIAGNOSIS — F17200 Nicotine dependence, unspecified, uncomplicated: Secondary | ICD-10-CM | POA: Insufficient documentation

## 2013-01-27 DIAGNOSIS — Z79899 Other long term (current) drug therapy: Secondary | ICD-10-CM | POA: Insufficient documentation

## 2013-01-27 DIAGNOSIS — N509 Disorder of male genital organs, unspecified: Secondary | ICD-10-CM | POA: Insufficient documentation

## 2013-01-27 LAB — BASIC METABOLIC PANEL
BUN: 15 mg/dL (ref 6–23)
CO2: 26 mEq/L (ref 19–32)
Calcium: 9.1 mg/dL (ref 8.4–10.5)
Chloride: 102 mEq/L (ref 96–112)
Creatinine, Ser: 1.26 mg/dL (ref 0.50–1.35)
GFR calc Af Amer: 84 mL/min — ABNORMAL LOW (ref >90)
GFR calc non Af Amer: 73 mL/min — ABNORMAL LOW (ref >90)
Glucose, Bld: 105 mg/dL — ABNORMAL HIGH (ref 70–99)
Potassium: 3.9 mEq/L (ref 3.5–5.1)
Sodium: 138 mEq/L (ref 135–145)

## 2013-01-27 LAB — CBC WITH DIFFERENTIAL/PLATELET
Basophils Absolute: 0.1 10*3/uL (ref 0.0–0.1)
Basophils Relative: 1 % (ref 0–1)
Eosinophils Absolute: 0.5 10*3/uL (ref 0.0–0.7)
Eosinophils Relative: 7 % — ABNORMAL HIGH (ref 0–5)
HCT: 41.4 % (ref 39.0–52.0)
Hemoglobin: 14.1 g/dL (ref 13.0–17.0)
Lymphocytes Relative: 57 % — ABNORMAL HIGH (ref 12–46)
Lymphs Abs: 4.2 10*3/uL — ABNORMAL HIGH (ref 0.7–4.0)
MCH: 29.7 pg (ref 26.0–34.0)
MCHC: 34.1 g/dL (ref 30.0–36.0)
MCV: 87.2 fL (ref 78.0–100.0)
Monocytes Absolute: 0.6 10*3/uL (ref 0.1–1.0)
Monocytes Relative: 8 % (ref 3–12)
Neutro Abs: 2 10*3/uL (ref 1.7–7.7)
Neutrophils Relative %: 27 % — ABNORMAL LOW (ref 43–77)
Platelets: 260 10*3/uL (ref 150–400)
RBC: 4.75 MIL/uL (ref 4.22–5.81)
RDW: 13 % (ref 11.5–15.5)
WBC: 7.5 10*3/uL (ref 4.0–10.5)

## 2013-01-27 LAB — URINALYSIS, ROUTINE W REFLEX MICROSCOPIC
Bilirubin Urine: NEGATIVE
Leukocytes, UA: NEGATIVE
Nitrite: NEGATIVE
Specific Gravity, Urine: 1.01 (ref 1.005–1.030)
Urobilinogen, UA: 0.2 mg/dL (ref 0.0–1.0)
pH: 6 (ref 5.0–8.0)

## 2013-01-27 MED ORDER — ONDANSETRON 8 MG PO TBDP
8.0000 mg | ORAL_TABLET | Freq: Once | ORAL | Status: AC
  Administered 2013-01-27: 8 mg via ORAL
  Filled 2013-01-27: qty 1

## 2013-01-27 MED ORDER — IBUPROFEN 800 MG PO TABS
800.0000 mg | ORAL_TABLET | Freq: Once | ORAL | Status: AC
  Administered 2013-01-27: 800 mg via ORAL
  Filled 2013-01-27: qty 1

## 2013-01-27 MED ORDER — OXYCODONE-ACETAMINOPHEN 5-325 MG PO TABS
1.0000 | ORAL_TABLET | Freq: Once | ORAL | Status: AC
  Administered 2013-01-27: 1 via ORAL
  Filled 2013-01-27: qty 1

## 2013-01-27 MED ORDER — HYDROMORPHONE HCL PF 1 MG/ML IJ SOLN
2.0000 mg | Freq: Once | INTRAMUSCULAR | Status: AC
  Administered 2013-01-27: 2 mg via INTRAMUSCULAR
  Filled 2013-01-27: qty 2

## 2013-01-27 NOTE — ED Notes (Signed)
Pt presents with left testicular pain x 3 weeks that is progressively worsening. Pt states pain radiates from groin into teste and down lt leg.  Denies difficulty voiding.

## 2013-01-27 NOTE — ED Notes (Signed)
Pt c/o left sided groin pain radiating to left testicle down left leg. X 3wks.

## 2013-01-27 NOTE — ED Provider Notes (Signed)
History  This chart was scribed for David Booze, MD by Bennett Scrape, ED Scribe. This patient was seen in room [facility]10/[facility]10 and the patient's care was started at 9:35 PM.  CSN: [id number]  Arrival date & time 01/27/13  1949   First MD Initiated Contact with Patient 01/27/13 2135      Chief Complaint  Patient presents with  . Testicle Pain  . Groin Pain  . Leg Pain     The history is provided by the patient. No language interpreter was used.    HPI Comments: David Leach is a 37 y.o. male who presents to the Emergency Department complaining of 3 weeks of constant left testicle pain described as a "sharp cramp" that radiates into his LLQ, lower back and down left leg with associated nausea. He rates his pain a 10 out of 10 currently and states that it feels like "it's drawing up". He reports that prolonged standing and ambulation aggravates his pain and he denies having any alleviating factors. He reports that he has tried ice, heat, ibuprofen, BC powders and tylenol with no improvement. Pt denies having prior episodes of similar symptoms. He denies swelling, chills, fevers, sweats and urinary symptoms as associated symptoms. He has a h/o asthma and is a current everyday smoker but denies alcohol use.   Past Medical History  Diagnosis Date  . Asthma     Past Surgical History  Procedure Laterality Date  . Tonsillectomy      Family History  Problem Relation Age of Onset  . Cancer Mother   . Diabetes Father   . Cancer Other   . Stroke Other   . Diabetes Other     History  Substance Use Topics  . Smoking status: Current Every Day Smoker -- 0.50 packs/day for 20 years    Types: Cigarettes  . Smokeless tobacco: Former Neurosurgeon  . Alcohol Use: No      Review of Systems  Constitutional: Negative for fever and chills.  Gastrointestinal: Positive for nausea. Negative for vomiting.  Genitourinary: Positive for testicular pain. Negative for dysuria, frequency and scrotal  swelling.  All other systems reviewed and are negative.    Allergies  Review of patient's allergies indicates no known allergies.  Home Medications   Current Outpatient Rx  Name  Route  Sig  Dispense  Refill  . albuterol (PROVENTIL HFA;VENTOLIN HFA) 108 (90 BASE) MCG/ACT inhaler   Inhalation   Inhale 2 puffs into the lungs every 6 (six) hours as needed. For shortness of breath         . albuterol (PROVENTIL) (2.5 MG/3ML) 0.083% nebulizer solution   Nebulization   Take 3 mLs (2.5 mg total) by nebulization every 4 (four) hours as needed for wheezing.   75 mL   0   . Aspirin-Salicylamide-Caffeine (BC HEADACHE POWDER PO)   Oral   Take 1 Package by mouth daily as needed. For pain         . ibuprofen (ADVIL,MOTRIN) 200 MG tablet   Oral   Take 800 mg by mouth every 6 (six) hours as needed. For pain         . LORazepam (ATIVAN) 1 MG tablet      1 tab PO BID prn anxiety   4 tablet   0   . naproxen (NAPROSYN) 250 MG tablet   Oral   Take 1 tablet (250 mg total) by mouth 2 (two) times daily with a meal.   14 tablet   0   .  oxyCODONE-acetaminophen (PERCOCET/ROXICET) 5-325 MG per tablet      1 or 2 tabs PO q6h prn pain   20 tablet   0   . predniSONE (DELTASONE) 20 MG tablet   Oral   Take 2 tablets (40 mg total) by mouth daily.   10 tablet   0     Triage Vitals: Pulse 83  Temp(Src) 98.2 F (36.8 C) (Oral)  Resp 20  Ht 5\' 11"  (1.803 m)  Wt 205 lb (92.987 kg)  BMI 28.6 kg/m2  SpO2 98%  Physical Exam  Nursing note and vitals reviewed. Constitutional: He is oriented to person, place, and time. He appears well-developed and well-nourished. No distress.  HENT:  Head: Normocephalic and atraumatic.  Eyes: Conjunctivae and EOM are normal.  Neck: Neck supple. No tracheal deviation present.  Cardiovascular: Normal rate and regular rhythm.   Pulmonary/Chest: Effort normal and breath sounds normal. No respiratory distress.  Abdominal: Soft. There is no tenderness.   Genitourinary:  circumcised penis, testes descended and non-tender, no swelling or induration, no inguinal hernias, moderate tenderness to palpation of the left inguinal canal, no mass felt, no inguinal adenopathy   Musculoskeletal: Normal range of motion. He exhibits no edema.  Neurological: He is alert and oriented to person, place, and time.  Skin: Skin is warm and dry.  Psychiatric: He has a normal mood and affect. His behavior is normal.    ED Course  Procedures (including critical care time)  Medications  ondansetron (ZOFRAN-ODT) disintegrating tablet 8 mg (not administered)  oxyCODONE-acetaminophen (PERCOCET/ROXICET) 5-325 MG per tablet 1 tablet (not administered)  ibuprofen (ADVIL,MOTRIN) tablet 800 mg (not administered)    DIAGNOSTIC STUDIES: Oxygen Saturation is 98% on room air, normal by my interpretation.    COORDINATION OF CARE: 9:41 PM-Addressed smoking cessation. Informed pt that I don't believe that his symptoms are indicative of an inguinal hernia, torsion or epididymitis. Discussed treatment plan which includes medications, CBC panel, BMP and UA with pt at bedside and pt agreed to plan. Will provide a referral if work up is normal.  Results for orders placed during the hospital encounter of 01/27/13  URINALYSIS, ROUTINE W REFLEX MICROSCOPIC      Result Value Range   Color, Urine YELLOW  YELLOW   APPearance CLEAR  CLEAR   Specific Gravity, Urine 1.010  1.005 - 1.030   pH 6.0  5.0 - 8.0   Glucose, UA NEGATIVE  NEGATIVE mg/dL   Hgb urine dipstick NEGATIVE  NEGATIVE   Bilirubin Urine NEGATIVE  NEGATIVE   Ketones, ur NEGATIVE  NEGATIVE mg/dL   Protein, ur NEGATIVE  NEGATIVE mg/dL   Urobilinogen, UA 0.2  0.0 - 1.0 mg/dL   Nitrite NEGATIVE  NEGATIVE   Leukocytes, UA NEGATIVE  NEGATIVE  CBC WITH DIFFERENTIAL      Result Value Range   WBC 7.5  4.0 - 10.5 K/uL   RBC 4.75  4.22 - 5.81 MIL/uL   Hemoglobin 14.1  13.0 - 17.0 g/dL   HCT 16.1  09.6 - 04.5 %   MCV 87.2   78.0 - 100.0 fL   MCH 29.7  26.0 - 34.0 pg   MCHC 34.1  30.0 - 36.0 g/dL   RDW 40.9  81.1 - 91.4 %   Platelets 260  150 - 400 K/uL   Neutrophils Relative % 27 (*) 43 - 77 %   Neutro Abs 2.0  1.7 - 7.7 K/uL   Lymphocytes Relative 57 (*) 12 - 46 %   Lymphs  Abs 4.2 (*) 0.7 - 4.0 K/uL   Monocytes Relative 8  3 - 12 %   Monocytes Absolute 0.6  0.1 - 1.0 K/uL   Eosinophils Relative 7 (*) 0 - 5 %   Eosinophils Absolute 0.5  0.0 - 0.7 K/uL   Basophils Relative 1  0 - 1 %   Basophils Absolute 0.1  0.0 - 0.1 K/uL  BASIC METABOLIC PANEL      Result Value Range   Sodium 138  135 - 145 mEq/L   Potassium 3.9  3.5 - 5.1 mEq/L   Chloride 102  96 - 112 mEq/L   CO2 26  19 - 32 mEq/L   Glucose, Bld 105 (*) 70 - 99 mg/dL   BUN 15  6 - 23 mg/dL   Creatinine, Ser 4.54  0.50 - 1.35 mg/dL   Calcium 9.1  8.4 - 09.8 mg/dL   GFR calc non Af Amer 73 (*) >90 mL/min   GFR calc Af Amer 84 (*) >90 mL/min     1. Scrotal pain       MDM  Scrotal pain of uncertain cause. Clinically, this is not torsion and does not epididymitis. It has been present for several weeks and there is no testicular swelling or tenderness. I cannot feel a hernia. It is possible that this is pain from the varicocele and she will be sent for scrotal ultrasound for that. Ultrasound is not available today. He will be brought back tomorrow for the ultrasound. Pain was controlled in the ED with him ibuprofen and Percocet which gave limited relief of pain and he got better relief with a dose of hydromorphone. He is sent home with prescriptions for Percocet and naproxen and is to return tomorrow for ultrasound and he is referred to urology for followup.  I personally performed the services described in this documentation, which was scribed in my presence. The recorded information has been reviewed and is accurate.         David Booze, MD 01/28/13 [phone]

## 2013-01-28 ENCOUNTER — Other Ambulatory Visit (HOSPITAL_COMMUNITY): Payer: Medicaid Other

## 2013-01-28 ENCOUNTER — Emergency Department (HOSPITAL_COMMUNITY): Payer: Medicaid Other

## 2013-01-28 MED ORDER — NAPROXEN 500 MG PO TABS: 500.0000 mg | ORAL_TABLET | Freq: Two times a day (BID) | ORAL | Status: DC

## 2013-01-28 MED ORDER — OXYCODONE-ACETAMINOPHEN 5-325 MG PO TABS: 1.0000 | ORAL_TABLET | ORAL | Status: DC | PRN

## 2013-01-28 MED ORDER — OXYCODONE-ACETAMINOPHEN 7.5-325 MG PO TABS: 1.0000 | ORAL_TABLET | ORAL | Status: DC | PRN

## 2013-01-28 NOTE — ED Notes (Addendum)
Pt getting scrotal US tonight

## 2013-01-28 NOTE — ED Notes (Signed)
Pt left without signing 

## 2013-01-28 NOTE — ED Provider Notes (Signed)
0100 Assumed care/disposition of patient. He has testicular pain. He awaits [place].  67 [place] is negative for acute injury. He has a hydrocele. Reviewed results with the patient. D/C home with referral to Dr. Jerre Simon.  US Scrotum  01/28/2013   *RADIOLOGY REPORT*  Clinical Data: Testicular and groin pain  ULTRASOUND OF SCROTUM  Technique:  Complete ultrasound examination of the testicles, epididymis, and other scrotal structures was performed.  Comparison:  None.  Findings:  Right testis:  Measures 3.6 x 2.3 x 2.3 cm.  Color Doppler flow with arterial and venous wave forms documented.  Left testis:  Measures 3.1 x 1.8 x 2.6 cm.  Small punctate calcification versus focus of fat.  No focal lesion otherwise. Color Doppler flow with arterial and venous wave forms documented bilaterally.  Right epididymis:  Normal in size and appearance.  Left epididymis:  Normal in size and appearance.  Hydrocele:  Small, right greater than left.  Varicocele:  Absent  IMPRESSION: Color Doppler flow with arterial and venous wave forms documented to normal echogenicity testicles bilaterally.  Small hydroceles.   Original Report Authenticated By: Jearld Lesch, M.D.   [place] Art/ven Flow Abd Pelv Doppler  01/28/2013   *RADIOLOGY REPORT*  Clinical Data: Testicular and groin pain  ULTRASOUND OF SCROTUM  Technique:  Complete ultrasound examination of the testicles, epididymis, and other scrotal structures was performed.  Comparison:  None.  Findings:  Right testis:  Measures 3.6 x 2.3 x 2.3 cm.  Color Doppler flow with arterial and venous wave forms documented.  Left testis:  Measures 3.1 x 1.8 x 2.6 cm.  Small punctate calcification versus focus of fat.  No focal lesion otherwise. Color Doppler flow with arterial and venous wave forms documented bilaterally.  Right epididymis:  Normal in size and appearance.  Left epididymis:  Normal in size and appearance.  Hydrocele:  Small, right greater than left.  Varicocele:  Absent  IMPRESSION: Color  Doppler flow with arterial and venous wave forms documented to normal echogenicity testicles bilaterally.  Small hydroceles.   Original Report Authenticated By: Jearld Lesch, M.D.    Nicoletta Dress. Colon Branch, MD 01/28/13 [phone]

## 2013-01-29 MED FILL — Oxycodone w/ Acetaminophen Tab 5-325 MG: ORAL | Qty: 6 | Status: AC

## 2013-02-07 ENCOUNTER — Emergency Department (HOSPITAL_COMMUNITY)
Admission: EM | Admit: 2013-02-07 | Discharge: 2013-02-08 | Disposition: A | Discharge status: Home / Self Care | Payer: Medicaid Other | Attending: Emergency Medicine | Admitting: Emergency Medicine

## 2013-02-07 ENCOUNTER — Encounter (HOSPITAL_COMMUNITY): Payer: Self-pay

## 2013-02-07 DIAGNOSIS — Z79899 Other long term (current) drug therapy: Secondary | ICD-10-CM | POA: Insufficient documentation

## 2013-02-07 DIAGNOSIS — G8929 Other chronic pain: Secondary | ICD-10-CM | POA: Insufficient documentation

## 2013-02-07 DIAGNOSIS — N509 Disorder of male genital organs, unspecified: Secondary | ICD-10-CM | POA: Insufficient documentation

## 2013-02-07 DIAGNOSIS — F17200 Nicotine dependence, unspecified, uncomplicated: Secondary | ICD-10-CM | POA: Insufficient documentation

## 2013-02-07 DIAGNOSIS — J45909 Unspecified asthma, uncomplicated: Secondary | ICD-10-CM | POA: Insufficient documentation

## 2013-02-07 DIAGNOSIS — N5082 Scrotal pain: Secondary | ICD-10-CM

## 2013-02-07 NOTE — ED Notes (Signed)
Pt seen here a few weeks ago for groin pain, had [place] for same, told to follow up with urologist for which he has appt for Monday, states is very painful and needs something for pain before then.

## 2013-02-08 MED ORDER — OXYCODONE-ACETAMINOPHEN 7.5-325 MG PO TABS: 1.0000 | ORAL_TABLET | ORAL | Status: DC | PRN

## 2013-02-08 MED ORDER — OXYCODONE-ACETAMINOPHEN 5-325 MG PO TABS: 2.0000 | ORAL_TABLET | ORAL | Status: DC | PRN

## 2013-02-08 MED ORDER — OXYCODONE-ACETAMINOPHEN 5-325 MG PO TABS: 1.0000 | ORAL_TABLET | ORAL | Status: DC | PRN

## 2013-02-08 MED ORDER — HYDROMORPHONE HCL PF 1 MG/ML IJ SOLN
INTRAMUSCULAR | Status: AC
  Filled 2013-02-08: qty 1

## 2013-02-08 MED ORDER — HYDROMORPHONE HCL PF 1 MG/ML IJ SOLN
1.0000 mg | Freq: Once | INTRAMUSCULAR | Status: AC
  Administered 2013-02-08: 1 mg via INTRAMUSCULAR

## 2013-02-08 MED ORDER — OXYCODONE-ACETAMINOPHEN 5-325 MG PO TABS
2.0000 | ORAL_TABLET | Freq: Once | ORAL | Status: AC
  Administered 2013-02-08: 2 via ORAL
  Filled 2013-02-08: qty 2

## 2013-02-08 NOTE — ED Provider Notes (Signed)
History     CSN: [id number]  Arrival date & time 02/07/13  2316   First MD Initiated Contact with Patient 02/07/13 2330      Chief Complaint  Patient presents with  . Groin Pain    (Consider location/radiation/quality/duration/timing/severity/associated sxs/prior treatment) HPI Comments: David Leach is a 37 y.o. Male presenting with left groin and scrotal pain which has been persistent since he was seen here for this same complaint 10 days ago, stating he has a chronic sharp cramp in the left testicle which radiates into his left lower pelvis and his left upper thigh and is worsened by movement,  Lifted and prolonged standing.  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  He denies swelling in his scrotum and pelvis.  He reports a "pulling"  sensation as if his left testicle is pulling into his left pelvis.  He has tried both ice heat, anti-inflammatories without relief.  He was prescribed Percocet as last visit here which was helpful but he is out of this medication.  He is scheduled to see Dr. Jesse Fall in 5 days for further evaluation.  His had no fevers or chills, denies dysuria or hematuria, has had no penile discharge.  He has found no alleviators for his symptoms.    The history is provided by the patient and the spouse.    Past Medical History  Diagnosis Date  . Asthma     Past Surgical History  Procedure Laterality Date  . Tonsillectomy      Family History  Problem Relation Age of Onset  . Cancer Mother   . Diabetes Father   . Cancer Other   . Stroke Other   . Diabetes Other     History  Substance Use Topics  . Smoking status: Current Every Day Smoker -- 0.50 packs/day for 20 years    Types: Cigarettes  . Smokeless tobacco: Former Neurosurgeon  . Alcohol Use: No      Review of Systems  Constitutional: Negative for fever.  HENT: Negative for congestion, sore  throat and neck pain.   Eyes: Negative.   Respiratory: Negative for chest tightness and shortness of breath.   Cardiovascular: Negative for chest pain.  Gastrointestinal: Negative for nausea and abdominal pain.  Genitourinary: Positive for testicular pain. Negative for dysuria, hematuria, flank pain, discharge, scrotal swelling, difficulty urinating and penile pain.  Musculoskeletal: Negative for joint swelling and arthralgias.  Skin: Negative.  Negative for rash and wound.  Neurological: Negative for dizziness, weakness, light-headedness, numbness and headaches.  Psychiatric/Behavioral: Negative.     Allergies  Review of patient's allergies indicates no known allergies.  Home Medications   Current Outpatient Rx  Name  Route  Sig  Dispense  Refill  . acetaminophen (TYLENOL) 500 MG tablet   Oral   Take 500 mg by mouth every 6 (six) hours as needed for pain.         Marland Kitchen albuterol (PROVENTIL HFA;VENTOLIN HFA) 108 (90 BASE) MCG/ACT inhaler   Inhalation   Inhale 2 puffs into the lungs every 6 (six) hours as needed. For shortness of breath         . albuterol (PROVENTIL) (2.5 MG/3ML) 0.083% nebulizer solution   Nebulization   Take 3 mLs (2.5 mg total) by nebulization every 4 (four) hours as needed for wheezing.   75 mL   0   . Aspirin-Salicylamide-Caffeine (  BC HEADACHE POWDER PO)   Oral   Take 1 packet by mouth daily as needed. For pain         . ibuprofen (ADVIL,MOTRIN) 200 MG tablet   Oral   Take 800 mg by mouth every 6 (six) hours as needed. For pain         . naproxen (NAPROSYN) 500 MG tablet   Oral   Take 1 tablet (500 mg total) by mouth 2 (two) times daily.   30 tablet   0   . oxyCODONE-acetaminophen (PERCOCET) 7.5-325 MG per tablet   Oral   Take 1 tablet by mouth every 4 (four) hours as needed for pain.   20 tablet   0   . oxyCODONE-acetaminophen (PERCOCET) 7.5-325 MG per tablet   Oral   Take 1 tablet by mouth every 4 (four) hours as needed for pain.    30 tablet   0   . oxyCODONE-acetaminophen (PERCOCET/ROXICET) 5-325 MG per tablet   Oral   Take 1 tablet by mouth every 4 (four) hours as needed for pain.   6 tablet   0     BP 154/79  Pulse 89  Temp(Src) 99.2 F (37.3 C) (Oral)  Resp 16  Ht 6\' 1"  (1.854 m)  Wt 200 lb (90.719 kg)  BMI 26.39 kg/m2  SpO2 100%  Physical Exam  Nursing note and vitals reviewed. Constitutional: He appears well-developed and well-nourished.  HENT:  Head: Normocephalic and atraumatic.  Eyes: Conjunctivae are normal.  Neck: Normal range of motion.  Cardiovascular: Normal rate, regular rhythm, normal heart sounds and intact distal pulses.   Pulmonary/Chest: Effort normal and breath sounds normal. He has no wheezes.  Abdominal: Soft. Bowel sounds are normal. He exhibits no distension and no mass. There is no tenderness. There is no guarding.  Genitourinary: Penis normal. Circumcised. No penile tenderness.  Testes descended, left is present, more high riding than right.  Nontender.  No direct or indirect hernia appreciated.  He is tender at his left inguinal canal and left suprapubic area.  No inguinal adenopathy present.  Musculoskeletal: Normal range of motion.  Neurological: He is alert.  Skin: Skin is warm and dry.  Psychiatric: He has a normal mood and affect.    ED Course  Procedures (including critical care time)  Labs Reviewed - No data to display No results found.   1. Scrotal pain       MDM  Ultrasound which was completed at his last visit here was reviewed, small bilateral hydroceles present otherwise normal study.  Patient with now chronic left scrotal pain as he reports his symptoms actually started early May, with normal ultrasound at recent visit.  No concerning findings on physical exam.  He was prescribed Percocet for pain, encouraged to keep his appointment with Dr. Jesse Fall on Monday.  In the interim he was given a work note for lighter duty.  Encouraged rest, no prolonged  standing as this seems to exacerbate his pain.  Return here immediately for any worsened symptoms, nausea vomiting fevers or swelling.        Burgess Amor, PA-C 02/08/13 [phone]

## 2013-02-08 NOTE — ED Provider Notes (Signed)
Medical screening examination/treatment/procedure(s) were performed by non-physician practitioner and as supervising physician I was immediately available for consultation/collaboration.  Nicoletta Dress. Colon Branch, MD 02/08/13 1610

## 2013-02-13 ENCOUNTER — Emergency Department (HOSPITAL_COMMUNITY)
Admission: EM | Admit: 2013-02-13 | Discharge: 2013-02-13 | Disposition: A | Discharge status: Home / Self Care | Payer: Medicaid Other | Attending: Emergency Medicine | Admitting: Emergency Medicine

## 2013-02-13 ENCOUNTER — Encounter (HOSPITAL_COMMUNITY): Payer: Self-pay | Admitting: Emergency Medicine

## 2013-02-13 DIAGNOSIS — R1030 Lower abdominal pain, unspecified: Secondary | ICD-10-CM

## 2013-02-13 DIAGNOSIS — F17200 Nicotine dependence, unspecified, uncomplicated: Secondary | ICD-10-CM | POA: Insufficient documentation

## 2013-02-13 DIAGNOSIS — Z79899 Other long term (current) drug therapy: Secondary | ICD-10-CM | POA: Insufficient documentation

## 2013-02-13 DIAGNOSIS — J45909 Unspecified asthma, uncomplicated: Secondary | ICD-10-CM | POA: Insufficient documentation

## 2013-02-13 DIAGNOSIS — N509 Disorder of male genital organs, unspecified: Secondary | ICD-10-CM | POA: Insufficient documentation

## 2013-02-13 DIAGNOSIS — R109 Unspecified abdominal pain: Secondary | ICD-10-CM | POA: Insufficient documentation

## 2013-02-13 DIAGNOSIS — R110 Nausea: Secondary | ICD-10-CM | POA: Insufficient documentation

## 2013-02-13 MED ORDER — OXYCODONE-ACETAMINOPHEN 5-325 MG PO TABS: 1.0000 | ORAL_TABLET | Freq: Four times a day (QID) | ORAL | Status: DC | PRN

## 2013-02-13 MED ORDER — OXYCODONE-ACETAMINOPHEN 5-325 MG PO TABS
1.0000 | ORAL_TABLET | Freq: Once | ORAL | Status: AC
  Administered 2013-02-13: 1 via ORAL
  Filled 2013-02-13: qty 1

## 2013-02-13 NOTE — ED Notes (Signed)
Pt here for pain control for left hernia. Pt states he has had it for 2 months and is set up with a Careers adviser in [place].

## 2013-02-13 NOTE — ED Notes (Addendum)
Pt with left hernia pain for 2 months, awaiting to here from surgeon from [facility] to set up an appt for his "multiple small hernias" to left groin, has ran out of percocet since seen for it before

## 2013-02-13 NOTE — ED Notes (Signed)
EDP made aware of pt's request for something for pain 

## 2013-02-13 NOTE — ED Notes (Signed)
Pt provided with Dr. Ortencia Kick office number and address to follow-up with

## 2013-02-13 NOTE — ED Provider Notes (Signed)
History  This chart was scribed for [employer]. Rubin Payor, MD by Bennett Scrape, ED Scribe. This patient was seen in room APA11/APA11 and the patient's care was started at 3:46 PM.  CSN: [id number]  Arrival date & time 02/13/13  1525   First MD Initiated Contact with Patient 02/13/13 1546      Chief Complaint  Patient presents with  . Hernia     The history is provided by the patient. No language interpreter was used.    HPI Comments: RAMADAN COUEY is a 37 y.o. male who presents to the Emergency Department requesting pain control for a left inguinal hernia diagnosed by ultrasound 2 months ago. He states that he is awaiting to here back from a surgeon at [facility] to set up an appointment. He states that he has recently run out of his percocet prescription, last refilled during his last ED visit 6 days ago. He reports associated nausea but denies emesis and fevers as associated symptoms. He has a h/o asthma and is a current everyday smoker and occasional alcohol user.  Past Medical History  Diagnosis Date  . Asthma     Past Surgical History  Procedure Laterality Date  . Tonsillectomy      Family History  Problem Relation Age of Onset  . Cancer Mother   . Diabetes Father   . Cancer Other   . Stroke Other   . Diabetes Other     History  Substance Use Topics  . Smoking status: Current Every Day Smoker -- 0.50 packs/day for 20 years    Types: Cigarettes  . Smokeless tobacco: Former Neurosurgeon  . Alcohol Use: Yes      Review of Systems  Constitutional: Negative for fever.  Gastrointestinal: Positive for nausea. Negative for vomiting.  Genitourinary: Positive for testicular pain (and groin pain). Negative for dysuria.  All other systems reviewed and are negative.    Allergies  Review of patient's allergies indicates no known allergies.  Home Medications   Current Outpatient Rx  Name  Route  Sig  Dispense  Refill  . acetaminophen (TYLENOL) 500 MG tablet   Oral   Take 500 mg by mouth every 6 (six) hours as needed for pain.         Marland Kitchen albuterol (PROVENTIL HFA;VENTOLIN HFA) 108 (90 BASE) MCG/ACT inhaler   Inhalation   Inhale 2 puffs into the lungs every 6 (six) hours as needed. For shortness of breath         . albuterol (PROVENTIL) (2.5 MG/3ML) 0.083% nebulizer solution   Nebulization   Take 3 mLs (2.5 mg total) by nebulization every 4 (four) hours as needed for wheezing.   75 mL   0   . Aspirin-Salicylamide-Caffeine (BC HEADACHE POWDER PO)   Oral   Take 1 packet by mouth daily as needed. For pain         . ibuprofen (ADVIL,MOTRIN) 200 MG tablet   Oral   Take 800 mg by mouth every 6 (six) hours as needed. For pain         . naproxen (NAPROSYN) 500 MG tablet   Oral   Take 1 tablet (500 mg total) by mouth 2 (two) times daily.   30 tablet   0   . oxyCODONE-acetaminophen (PERCOCET) 7.5-325 MG per tablet   Oral   Take 1 tablet by mouth every 4 (four) hours as needed for pain.   20 tablet   0   . oxyCODONE-acetaminophen (PERCOCET) 7.5-325 MG per  tablet   Oral   Take 1 tablet by mouth every 4 (four) hours as needed for pain.   30 tablet   0   . oxyCODONE-acetaminophen (PERCOCET/ROXICET) 5-325 MG per tablet   Oral   Take 1 tablet by mouth every 4 (four) hours as needed for pain.   6 tablet   0   . oxyCODONE-acetaminophen (PERCOCET/ROXICET) 5-325 MG per tablet   Oral   Take 2 tablets by mouth every 4 (four) hours as needed for pain.   6 tablet   0   . oxyCODONE-acetaminophen (PERCOCET/ROXICET) 5-325 MG per tablet   Oral   Take 1-2 tablets by mouth every 6 (six) hours as needed for pain.   10 tablet   0     Triage Vitals: BP 129/82  Pulse 99  Temp(Src) 98.7 F (37.1 C)  Resp 18  Ht 5\' 11"  (1.803 m)  Wt 205 lb (92.987 kg)  BMI 28.6 kg/m2  SpO2 97%  Physical Exam  Nursing note and vitals reviewed. Constitutional: He is oriented to person, place, and time. He appears well-developed and well-nourished. No  distress.  HENT:  Head: Normocephalic and atraumatic.  Eyes: Conjunctivae and EOM are normal.  Neck: Neck supple. No tracheal deviation present.  Cardiovascular: Normal rate and regular rhythm.   Pulmonary/Chest: Effort normal and breath sounds normal. No respiratory distress.  Abdominal: Soft. There is no tenderness.  Genitourinary:  Left inguinal tenderness, mild left testicular tenderness, no masses, no induration  Musculoskeletal: Normal range of motion.  Neurological: He is alert and oriented to person, place, and time.  Skin: Skin is warm and dry.  Psychiatric: He has a normal mood and affect. His behavior is normal.    ED Course  Procedures (including critical care time)  DIAGNOSTIC STUDIES: Oxygen Saturation is 97% on room air, normal by my interpretation.    COORDINATION OF CARE: 4:07 PM-Discussed treatment plan which includes review of prior records with pt at bedside and pt agreed to plan.   Labs Reviewed - No data to display No results found.   1. Inguinal pain       MDM  Patient with left inguinal testicle pain. His been seen in the ED previous for it and diagnosed with possible hydrocele. Was supposed to followup with urology yesterday, but went to [facility] on Sunday. Patient's records were reviewed CT was negative. Ultrasound showed possible inguinal fat hernias. Patient was given pain medicines but states he used to walk. Patient given a few more medicines here. He states he has followup. He'll be given no more pain medicines through the ER for this.      I personally performed the services described in this documentation, which was scribed in my presence. The recorded information has been reviewed and is accurate.     Juliet Rude. Rubin Payor, MD 02/13/13 1625

## 2013-02-21 MED FILL — Oxycodone w/ Acetaminophen Tab 5-325 MG: ORAL | Qty: 6 | Status: AC

## 2013-03-08 ENCOUNTER — Other Ambulatory Visit (HOSPITAL_COMMUNITY): Payer: Self-pay | Admitting: General Surgery

## 2013-03-08 DIAGNOSIS — R52 Pain, unspecified: Secondary | ICD-10-CM

## 2013-03-12 ENCOUNTER — Ambulatory Visit (HOSPITAL_COMMUNITY): Payer: Medicaid Other

## 2013-03-13 ENCOUNTER — Ambulatory Visit (HOSPITAL_COMMUNITY): Admission: RE | Admit: 2013-03-13 | Payer: Medicaid Other | Source: Ambulatory Visit

## 2013-03-14 ENCOUNTER — Encounter (HOSPITAL_COMMUNITY): Payer: Self-pay | Admitting: *Deleted

## 2013-03-14 ENCOUNTER — Emergency Department (HOSPITAL_COMMUNITY)
Admission: EM | Admit: 2013-03-14 | Discharge: 2013-03-14 | Disposition: A | Discharge status: Home / Self Care | Payer: Medicaid Other | Attending: Emergency Medicine | Admitting: Emergency Medicine

## 2013-03-14 DIAGNOSIS — K089 Disorder of teeth and supporting structures, unspecified: Secondary | ICD-10-CM | POA: Insufficient documentation

## 2013-03-14 DIAGNOSIS — J45909 Unspecified asthma, uncomplicated: Secondary | ICD-10-CM | POA: Insufficient documentation

## 2013-03-14 DIAGNOSIS — K0889 Other specified disorders of teeth and supporting structures: Secondary | ICD-10-CM

## 2013-03-14 DIAGNOSIS — F17200 Nicotine dependence, unspecified, uncomplicated: Secondary | ICD-10-CM | POA: Insufficient documentation

## 2013-03-14 DIAGNOSIS — K047 Periapical abscess without sinus: Secondary | ICD-10-CM | POA: Insufficient documentation

## 2013-03-14 MED ORDER — HYDROCODONE-ACETAMINOPHEN 5-325 MG PO TABS: 1.0000 | ORAL_TABLET | ORAL | Status: DC | PRN

## 2013-03-14 MED ORDER — AMOXICILLIN 250 MG PO CAPS
500.0000 mg | ORAL_CAPSULE | Freq: Once | ORAL | Status: AC
  Administered 2013-03-14: 500 mg via ORAL
  Filled 2013-03-14: qty 2

## 2013-03-14 MED ORDER — HYDROCODONE-ACETAMINOPHEN 5-325 MG PO TABS
1.0000 | ORAL_TABLET | Freq: Once | ORAL | Status: AC
  Administered 2013-03-14: 1 via ORAL
  Filled 2013-03-14: qty 1

## 2013-03-14 MED ORDER — AMOXICILLIN 500 MG PO CAPS: 500.0000 mg | ORAL_CAPSULE | Freq: Three times a day (TID) | ORAL | Status: AC

## 2013-03-14 NOTE — ED Notes (Signed)
Pt has a tooth extracted from lt side of mandible 1 week  Ago. Cont to have pain, Can't get an appt with dentist til next week.  Pts rt  Pupil is larger than lt, says it has been like that for over 2 years and was supposed to have it evaluated., but has not.

## 2013-03-14 NOTE — ED Provider Notes (Signed)
History    CSN: [id number] Arrival date & time 03/14/13  1738  First MD Initiated Contact with Patient 03/14/13 1743     Chief Complaint  Patient presents with  . Dental Pain   (Consider location/radiation/quality/duration/timing/severity/associated sxs/prior Treatment) HPI Comments: David Leach is a 37 y.o. Male with dental pain, now swelling along his lower anterior jaw and lip since he had his left lateral incisor extracted last week by a dentist in [place].  He cannot get in to see him in followup until next week.   There has been no fevers,  Chills, nausea or vomiting, also no complaint of difficulty swallowing,  Although chewing makes pain worse.  The patient has tried ibuprofen after his prescription percocet ran out without relief of symptoms.         The history is provided by the patient.   Past Medical History  Diagnosis Date  . Asthma    Past Surgical History  Procedure Laterality Date  . Tonsillectomy     Family History  Problem Relation Age of Onset  . Cancer Mother   . Diabetes Father   . Cancer Other   . Stroke Other   . Diabetes Other    History  Substance Use Topics  . Smoking status: Current Every Day Smoker -- 0.50 packs/day for 20 years    Types: Cigarettes  . Smokeless tobacco: Former Neurosurgeon  . Alcohol Use: Yes    Review of Systems  Constitutional: Negative for fever.  HENT: Positive for dental problem. Negative for sore throat, facial swelling, neck pain and neck stiffness.   Respiratory: Negative for shortness of breath.     Allergies  Review of patient's allergies indicates no known allergies.  Home Medications   Current Outpatient Rx  Name  Route  Sig  Dispense  Refill  . albuterol (PROVENTIL HFA;VENTOLIN HFA) 108 (90 BASE) MCG/ACT inhaler   Inhalation   Inhale 2 puffs into the lungs every 6 (six) hours as needed. For shortness of breath         . acetaminophen (TYLENOL) 500 MG tablet   Oral   Take 500 mg by mouth  every 6 (six) hours as needed for pain.         Marland Kitchen amoxicillin (AMOXIL) 500 MG capsule   Oral   Take 1 capsule (500 mg total) by mouth 3 (three) times daily.   30 capsule   0   . Aspirin-Salicylamide-Caffeine (BC HEADACHE POWDER PO)   Oral   Take 1 packet by mouth daily as needed. For pain         . HYDROcodone-acetaminophen (NORCO/VICODIN) 5-325 MG per tablet   Oral   Take 1 tablet by mouth every 4 (four) hours as needed for pain.   15 tablet   0   . ibuprofen (ADVIL,MOTRIN) 200 MG tablet   Oral   Take 800 mg by mouth every 6 (six) hours as needed. For pain          BP 112/76  Pulse 81  Temp(Src) 98.3 F (36.8 C) (Oral)  Resp 17  Ht 5\' 11"  (1.803 m)  Wt 205 lb (92.987 kg)  BMI 28.6 kg/m2  SpO2 100% Physical Exam  Constitutional: He is oriented to person, place, and time. He appears well-developed and well-nourished. No distress.  HENT:  Head: Normocephalic and atraumatic. No trismus in the jaw.  Right Ear: Tympanic membrane and external ear normal.  Left Ear: Tympanic membrane and external ear normal.  Mouth/Throat:  Oropharynx is clear and moist and mucous membranes are normal. No oral lesions. Dental abscesses present.  Upper edentulous, only lower teeth remaining and central incisors and right lateral incisors and premolar.  Left central incisor with gingival edema and erythema,  No fluctuance.  ttp submental node,  Floor of mouth is soft.  Eyes: Conjunctivae are normal.  Neck: Normal range of motion. Neck supple.  Cardiovascular: Normal rate and normal heart sounds.   Pulmonary/Chest: Effort normal.  Abdominal: He exhibits no distension.  Musculoskeletal: Normal range of motion.  Lymphadenopathy:    He has no cervical adenopathy.  Neurological: He is alert and oriented to person, place, and time.  Skin: Skin is warm and dry. No erythema.  Psychiatric: He has a normal mood and affect.    ED Course  Procedures (including critical care time) Labs Reviewed  - No data to display No results found. 1. Pain, dental     MDM  Hydrocodone,  Amoxil , encouraged f/u by dentist as scheduled next week.  The patient appears reasonably screened and/or stabilized for discharge and I doubt any other medical condition or other [facility] requiring further screening, evaluation, or treatment in the ED at this time prior to discharge.   Burgess Amor, PA-C 03/14/13 [phone]

## 2013-03-14 NOTE — ED Provider Notes (Signed)
Medical screening examination/treatment/procedure(s) were performed by non-physician practitioner and as supervising physician I was immediately available for consultation/collaboration.   Rosellen Lichtenberger L Evanna Washinton, MD 03/14/13 1924 

## 2013-03-14 NOTE — ED Notes (Signed)
Pt had tooth extraction last week. States pain from procedure still. Pt states dentist gave him eight 7.5 percocet and he is now out.

## 2013-03-16 ENCOUNTER — Emergency Department: Payer: Self-pay | Admitting: Emergency Medicine

## 2013-04-23 ENCOUNTER — Emergency Department (HOSPITAL_COMMUNITY): Payer: Medicaid Other

## 2013-04-23 ENCOUNTER — Emergency Department (HOSPITAL_COMMUNITY)
Admission: EM | Admit: 2013-04-23 | Discharge: 2013-04-23 | Disposition: A | Discharge status: Home / Self Care | Payer: Medicaid Other | Attending: Emergency Medicine | Admitting: Emergency Medicine

## 2013-04-23 ENCOUNTER — Encounter (HOSPITAL_COMMUNITY): Payer: Self-pay | Admitting: *Deleted

## 2013-04-23 DIAGNOSIS — Y9389 Activity, other specified: Secondary | ICD-10-CM | POA: Insufficient documentation

## 2013-04-23 DIAGNOSIS — Z87828 Personal history of other (healed) physical injury and trauma: Secondary | ICD-10-CM | POA: Insufficient documentation

## 2013-04-23 DIAGNOSIS — S0990XA Unspecified injury of head, initial encounter: Secondary | ICD-10-CM

## 2013-04-23 DIAGNOSIS — J45909 Unspecified asthma, uncomplicated: Secondary | ICD-10-CM | POA: Insufficient documentation

## 2013-04-23 DIAGNOSIS — F17200 Nicotine dependence, unspecified, uncomplicated: Secondary | ICD-10-CM | POA: Insufficient documentation

## 2013-04-23 DIAGNOSIS — Y990 Civilian activity done for income or pay: Secondary | ICD-10-CM | POA: Insufficient documentation

## 2013-04-23 DIAGNOSIS — S0993XA Unspecified injury of face, initial encounter: Secondary | ICD-10-CM | POA: Insufficient documentation

## 2013-04-23 DIAGNOSIS — IMO0002 Reserved for concepts with insufficient information to code with codable children: Secondary | ICD-10-CM | POA: Insufficient documentation

## 2013-04-23 DIAGNOSIS — Y9289 Other specified places as the place of occurrence of the external cause: Secondary | ICD-10-CM | POA: Insufficient documentation

## 2013-04-23 DIAGNOSIS — W1789XA Other fall from one level to another, initial encounter: Secondary | ICD-10-CM | POA: Insufficient documentation

## 2013-04-23 DIAGNOSIS — Z79899 Other long term (current) drug therapy: Secondary | ICD-10-CM | POA: Insufficient documentation

## 2013-04-23 MED ORDER — IBUPROFEN 800 MG PO TABS: 800.0000 mg | ORAL_TABLET | Freq: Three times a day (TID) | ORAL | Status: DC

## 2013-04-23 MED ORDER — OXYCODONE-ACETAMINOPHEN 5-325 MG PO TABS
1.0000 | ORAL_TABLET | Freq: Once | ORAL | Status: AC
  Administered 2013-04-23: 1 via ORAL
  Filled 2013-04-23: qty 1

## 2013-04-23 MED ORDER — HYDROCODONE-ACETAMINOPHEN 5-325 MG PO TABS: 1.0000 | ORAL_TABLET | Freq: Four times a day (QID) | ORAL | Status: DC | PRN

## 2013-04-23 NOTE — ED Notes (Signed)
Fell app 4 ft off ladder.  Struck top of head, neck hurts , back pain ?LOC Sl nausea, no vomiting

## 2013-04-23 NOTE — ED Provider Notes (Signed)
CSN: [id number]     Arrival date & time 04/23/13  1820 History  This chart was scribed for Benny Lennert, MD, by Frederik Pear, ED scribe. The patient was seen in room APA04/APA04 and the patient's care was started at 1830.  First MD Initiated Contact with Patient 04/23/13 1830     Chief Complaint  Patient presents with  . Fall   (Consider location/radiation/quality/duration/timing/severity/associated sxs/prior Treatment) Patient is a 37 y.o. male presenting with fall. The history is provided by the patient and medical records. No language interpreter was used.  Fall This is a new problem. The current episode started 6 to 12 hours ago. The problem occurs constantly. The problem has not changed since onset.Associated symptoms include headaches. Pertinent negatives include no chest pain and no abdominal pain. Treatments tried: applying a c-collar.   HPI Comments: David Leach is a 37 y.o. male who presents to the Emergency Department complaining of a fall from a 4 ft ladder that occurred at 0830 this morning while he was at work. He reports he fell backward and hit his head on scaffolding. He reports questionable syncope for a few seconds and a persistent HA since the fall. He denies paresthesia, CP, and abdominal pain. He reports a h/o of a fall several years ago when he fell 15 ft from a deer stand and injured his neck and head.  Tetanus is UTD.   Past Medical History  Diagnosis Date  . Asthma    Past Surgical History  Procedure Laterality Date  . Tonsillectomy     Family History  Problem Relation Age of Onset  . Cancer Mother   . Diabetes Father   . Cancer Other   . Stroke Other   . Diabetes Other    History  Substance Use Topics  . Smoking status: Current Every Day Smoker -- 0.50 packs/day for 20 years    Types: Cigarettes  . Smokeless tobacco: Former Neurosurgeon  . Alcohol Use: Yes    Review of Systems  Constitutional: Negative for appetite change and fatigue.  HENT:  Positive for neck pain. Negative for congestion, sinus pressure and ear discharge.   Eyes: Negative for discharge.  Respiratory: Negative for cough.   Cardiovascular: Negative for chest pain.  Gastrointestinal: Negative for abdominal pain and diarrhea.  Genitourinary: Negative for frequency and hematuria.  Musculoskeletal: Positive for back pain.  Skin: Negative for rash.  Neurological: Positive for headaches. Negative for seizures and numbness.  Psychiatric/Behavioral: Negative for hallucinations.    Allergies  Review of patient's allergies indicates no known allergies.  Home Medications   Current Outpatient Rx  Name  Route  Sig  Dispense  Refill  . acetaminophen (TYLENOL) 500 MG tablet   Oral   Take 500 mg by mouth every 6 (six) hours as needed for pain.         Marland Kitchen albuterol (PROVENTIL HFA;VENTOLIN HFA) 108 (90 BASE) MCG/ACT inhaler   Inhalation   Inhale 2 puffs into the lungs every 6 (six) hours as needed. For shortness of breath         . Aspirin-Salicylamide-Caffeine (BC HEADACHE POWDER PO)   Oral   Take 1 packet by mouth daily as needed. For pain         . HYDROcodone-acetaminophen (NORCO/VICODIN) 5-325 MG per tablet   Oral   Take 1 tablet by mouth every 4 (four) hours as needed for pain.   15 tablet   0   . ibuprofen (ADVIL,MOTRIN) 200 MG tablet  Oral   Take 800 mg by mouth every 6 (six) hours as needed. For pain          BP 129/83  Pulse 88  Temp(Src) 98.4 F (36.9 C) (Oral)  Resp 20  Ht 5\' 11"  (1.803 m)  Wt 205 lb (92.987 kg)  BMI 28.6 kg/m2  SpO2 99% Physical Exam  Nursing note and vitals reviewed. Constitutional: He is oriented to person, place, and time. He appears well-developed. Cervical collar in place.  HENT:  Minor tenderness and 0.5 cm laceration to occiput.   Eyes: Conjunctivae and EOM are normal. No scleral icterus.  Neck: Neck supple. Muscular tenderness present. No thyromegaly present.  Minor tenderness to posterior neck.   Cardiovascular: Normal rate and regular rhythm.  Exam reveals no gallop and no friction rub.   No murmur heard. Pulmonary/Chest: No stridor. He has no wheezes. He has no rales. He exhibits no tenderness.  Abdominal: He exhibits no distension. There is no tenderness. There is no rebound.  Musculoskeletal: Normal range of motion. He exhibits tenderness. He exhibits no edema.  Minor tenderness to lumbar spine.   Lymphadenopathy:    He has no cervical adenopathy.  Neurological: He is oriented to person, place, and time. Coordination normal.  Skin: No rash noted. No erythema.  Psychiatric: He has a normal mood and affect. His behavior is normal.   ED Course  Procedures (including critical care time)  DIAGNOSTIC STUDIES: Oxygen Saturation is 99% on room air, normal by my interpretation.    COORDINATION OF CARE:  18:50- Discussed planned course of treatment in the ED with the patient, including a head CT without contrast, cervical CT without contrast, and lumbar spine X-ray, who is agreeable at this time.  19:48- Recheck- Ordered Percocet for pain control. 1-view lumbar spine X-ray, cervical spine CT, and head CT independently read by radiologist and independently reviewed by Benny Lennert, MD. Discussed the impression and discharge plan with the pt including following up with a PCP if symptoms persist and pain control medication. He is agreeable at this time.   Labs Review Labs Reviewed - No data to display Imaging Review Dg Lumbar Spine Complete  04/23/2013   *RADIOLOGY REPORT*  Clinical Data: Low back pain after falling from ladder  LUMBAR SPINE - COMPLETE 4+ VIEW  Comparison: CT abdomen pelvis - 01/02/2012  Findings:  There are five non-rib bearing lumbar type vertebral bodies.  Very minimal scoliotic curvature of the inferior aspect of the thoracic spine, possibly positional.  No anterolisthesis or retrolisthesis.  Lumbar vertebral body heights are preserved.  Intervertebral disc spaces  are preserved.  Limited visualization of the bilateral SI joints and hips is normal.  Regional bowel gas pattern and soft tissues are normal.  IMPRESSION: No acute findings.   Original Report Authenticated By: Tacey Ruiz, MD   Ct Head Wo Contrast  04/23/2013   CLINICAL DATA:  Fall, hit head. Neck pain, headache.  EXAM: CT HEAD WITHOUT CONTRAST  CT CERVICAL SPINE WITHOUT CONTRAST  TECHNIQUE: Multidetector CT imaging of the head and cervical spine was performed following the standard protocol without intravenous contrast. Multiplanar CT image reconstructions of the cervical spine were also generated.  COMPARISON:  CT head 07/16/2010  FINDINGS: CT HEAD FINDINGS  No acute intracranial abnormality. Specifically, no hemorrhage, hydrocephalus, mass lesion, acute infarction, or significant intracranial injury. No acute calvarial abnormality.  Mild mucosal thickening in the paranasal sinuses. Mastoids are clear.  CT CERVICAL SPINE FINDINGS  Normal alignment. Prevertebral soft  tissues are normal. No fracture. Disc spaces are maintained. No epidural or paraspinal hematoma.  IMPRESSION: CT HEAD IMPRESSION  No intracranial abnormality  CT CERVICAL SPINE IMPRESSION  No bony abnormality   Electronically Signed   By: Charlett Nose   On: 04/23/2013 19:28   Ct Cervical Spine Wo Contrast  04/23/2013   CLINICAL DATA:  Fall, hit head. Neck pain, headache.  EXAM: CT HEAD WITHOUT CONTRAST  CT CERVICAL SPINE WITHOUT CONTRAST  TECHNIQUE: Multidetector CT imaging of the head and cervical spine was performed following the standard protocol without intravenous contrast. Multiplanar CT image reconstructions of the cervical spine were also generated.  COMPARISON:  CT head 07/16/2010  FINDINGS: CT HEAD FINDINGS  No acute intracranial abnormality. Specifically, no hemorrhage, hydrocephalus, mass lesion, acute infarction, or significant intracranial injury. No acute calvarial abnormality.  Mild mucosal thickening in the paranasal sinuses.  Mastoids are clear.  CT CERVICAL SPINE FINDINGS  Normal alignment. Prevertebral soft tissues are normal. No fracture. Disc spaces are maintained. No epidural or paraspinal hematoma.  IMPRESSION: CT HEAD IMPRESSION  No intracranial abnormality  CT CERVICAL SPINE IMPRESSION  No bony abnormality   Electronically Signed   By: Charlett Nose   On: 04/23/2013 19:28    MDM  No diagnosis found.  The chart was scribed for me under my direct supervision.  I personally performed the history, physical, and medical decision making and all procedures in the evaluation of this patient.Benny Lennert, MD 04/23/13 [phone]

## 2013-04-23 NOTE — ED Notes (Signed)
Pt alert & oriented x4, stable gait. Patient given discharge instructions, paperwork & prescription(s). Patient  instructed to stop at the registration desk to finish any additional paperwork. Patient verbalized understanding. Pt left department w/ no further questions. 

## 2013-07-04 ENCOUNTER — Emergency Department (HOSPITAL_COMMUNITY): Payer: Medicaid Other

## 2013-07-04 ENCOUNTER — Encounter (HOSPITAL_COMMUNITY): Payer: Self-pay | Admitting: Emergency Medicine

## 2013-07-04 ENCOUNTER — Emergency Department (HOSPITAL_COMMUNITY)
Admission: EM | Admit: 2013-07-04 | Discharge: 2013-07-04 | Disposition: A | Discharge status: Home / Self Care | Payer: Medicaid Other | Attending: Emergency Medicine | Admitting: Emergency Medicine

## 2013-07-04 DIAGNOSIS — Z791 Long term (current) use of non-steroidal anti-inflammatories (NSAID): Secondary | ICD-10-CM | POA: Insufficient documentation

## 2013-07-04 DIAGNOSIS — J45909 Unspecified asthma, uncomplicated: Secondary | ICD-10-CM | POA: Insufficient documentation

## 2013-07-04 DIAGNOSIS — Z79899 Other long term (current) drug therapy: Secondary | ICD-10-CM | POA: Insufficient documentation

## 2013-07-04 DIAGNOSIS — F17200 Nicotine dependence, unspecified, uncomplicated: Secondary | ICD-10-CM | POA: Insufficient documentation

## 2013-07-04 DIAGNOSIS — G43909 Migraine, unspecified, not intractable, without status migrainosus: Secondary | ICD-10-CM | POA: Insufficient documentation

## 2013-07-04 LAB — PROTIME-INR
INR: 1 (ref 0.00–1.49)
Prothrombin Time: 13 seconds (ref 11.6–15.2)

## 2013-07-04 LAB — BASIC METABOLIC PANEL
CO2: 31 mEq/L (ref 19–32)
Calcium: 9.6 mg/dL (ref 8.4–10.5)
Chloride: 101 mEq/L (ref 96–112)
Creatinine, Ser: 1.04 mg/dL (ref 0.50–1.35)
Glucose, Bld: 109 mg/dL — ABNORMAL HIGH (ref 70–99)
Sodium: 140 mEq/L (ref 135–145)

## 2013-07-04 LAB — CSF CELL COUNT WITH DIFFERENTIAL
RBC Count, CSF: 2 /mm3 — ABNORMAL HIGH
Tube #: 1
Tube #: 4
WBC, CSF: 1 /mm3 (ref 0–5)

## 2013-07-04 LAB — CBC
Hemoglobin: 14.9 g/dL (ref 13.0–17.0)
MCH: 31.1 pg (ref 26.0–34.0)
MCV: 91.6 fL (ref 78.0–100.0)
RBC: 4.79 MIL/uL (ref 4.22–5.81)
WBC: 10.3 10*3/uL (ref 4.0–10.5)

## 2013-07-04 LAB — PROTEIN AND GLUCOSE, CSF: Glucose, CSF: 70 mg/dL (ref 43–76)

## 2013-07-04 MED ORDER — NAPROXEN 500 MG PO TABS: 500.0000 mg | ORAL_TABLET | Freq: Two times a day (BID) | ORAL | Status: DC | PRN

## 2013-07-04 MED ORDER — METOCLOPRAMIDE HCL 5 MG/ML IJ SOLN
10.0000 mg | Freq: Once | INTRAMUSCULAR | Status: AC
  Administered 2013-07-04: 10 mg via INTRAVENOUS
  Filled 2013-07-04: qty 2

## 2013-07-04 MED ORDER — SODIUM CHLORIDE 0.9 % IV BOLUS (SEPSIS)
1000.0000 mL | Freq: Once | INTRAVENOUS | Status: AC
  Administered 2013-07-04: 1000 mL via INTRAVENOUS

## 2013-07-04 MED ORDER — DEXAMETHASONE SODIUM PHOSPHATE 4 MG/ML IJ SOLN
10.0000 mg | Freq: Once | INTRAMUSCULAR | Status: AC
  Administered 2013-07-04: 10 mg via INTRAVENOUS
  Filled 2013-07-04: qty 3

## 2013-07-04 NOTE — ED Notes (Signed)
Pt states that he has been cleaning lead paint from an old house for the past 2 months d/t his job.  Pt denies wearing any protective gear.  Notified edp

## 2013-07-04 NOTE — ED Notes (Signed)
Pt reports headache since last night, "worst of his life", moaning and crying out at times. +nausea and vomiting.

## 2013-07-04 NOTE — ED Provider Notes (Signed)
CSN: [id number]     Arrival date & time 07/04/13  1308 History  This chart was scribed for David B. Bernette Mayers, MD by Quintella Reichert, ED scribe.  This patient was seen in room APA12/APA12 and the patient's care was started at 7:52 AM.   Chief Complaint  Patient presents with  . Headache    The history is provided by the patient. No language interpreter was used.    HPI Comments: David Leach is a 37 y.o. male with h/o migraines who presents to the Emergency Department complaining of a constant, severe, gradual-onset, gradually-worsening headache that began last night.  Pain began last night but became severe this morning on waking.  It is generalized to his entire head extending into the back of his neck, and described as "like something is busting out of me."  It is associated with nausea and vomiting that began this morning, as well as photophobia and sweats.  Pt states he has been diagnosed with migraines by a neurologist (cannot remember physician's name, last seen several years ago), but states he has never had a headache as severe as at present.  4 days ago he had a similar but less severe headache which resolved on its own.   Past Medical History  Diagnosis Date  . Asthma     Past Surgical History  Procedure Laterality Date  . Tonsillectomy      Family History  Problem Relation Age of Onset  . Cancer Mother   . Diabetes Father   . Cancer Other   . Stroke Other   . Diabetes Other     History  Substance Use Topics  . Smoking status: Current Every Day Smoker -- 0.50 packs/day for 20 years    Types: Cigarettes  . Smokeless tobacco: Former Neurosurgeon  . Alcohol Use: Yes     Comment: occ     Review of Systems A complete 10 system review of systems was obtained and all systems are negative except as noted in the HPI and PMH.    Allergies  Review of patient's allergies indicates no known allergies.  Home Medications   Current Outpatient Rx  Name  Route  Sig   Dispense  Refill  . acetaminophen (TYLENOL) 500 MG tablet   Oral   Take 500 mg by mouth every 6 (six) hours as needed for pain.         Marland Kitchen albuterol (PROVENTIL HFA;VENTOLIN HFA) 108 (90 BASE) MCG/ACT inhaler   Inhalation   Inhale 2 puffs into the lungs every 6 (six) hours as needed. For shortness of breath         . Aspirin-Salicylamide-Caffeine (BC HEADACHE POWDER PO)   Oral   Take 1 packet by mouth daily as needed. For pain         . HYDROcodone-acetaminophen (NORCO/VICODIN) 5-325 MG per tablet   Oral   Take 1 tablet by mouth every 4 (four) hours as needed for pain.   15 tablet   0   . HYDROcodone-acetaminophen (NORCO/VICODIN) 5-325 MG per tablet   Oral   Take 1 tablet by mouth every 6 (six) hours as needed for pain.   20 tablet   0   . ibuprofen (ADVIL,MOTRIN) 200 MG tablet   Oral   Take 800 mg by mouth every 6 (six) hours as needed. For pain         . ibuprofen (ADVIL,MOTRIN) 800 MG tablet   Oral   Take 1 tablet (800 mg total) by mouth  3 (three) times daily.   21 tablet   0    BP 103/57  Pulse 70  Temp(Src) 98.3 F (36.8 C) (Oral)  Resp 24  Ht 5\' 11"  (1.803 m)  Wt 205 lb (92.987 kg)  BMI 28.60 kg/m2  SpO2 99%  Physical Exam  Nursing note and vitals reviewed. Constitutional: He is oriented to person, place, and time. He appears well-developed and well-nourished.  HENT:  Head: Normocephalic and atraumatic.  Eyes: EOM are normal. Pupils are equal, round, and reactive to light.  Neck: Normal range of motion. Neck supple.  Cardiovascular: Normal rate, normal heart sounds and intact distal pulses.   Pulmonary/Chest: Effort normal and breath sounds normal.  Abdominal: Bowel sounds are normal. He exhibits no distension. There is no tenderness.  Musculoskeletal: Normal range of motion. He exhibits no edema and no tenderness.  Neurological: He is alert and oriented to person, place, and time. He has normal strength. No cranial nerve deficit or sensory  deficit.  Skin: Skin is warm and dry. No rash noted.  Psychiatric: He has a normal mood and affect.    ED Course  LUMBAR PUNCTURE Date/Time: 07/04/2013 10:46 AM Performed by: Susy Frizzle B. Authorized by: Pollyann Savoy Consent: Verbal consent obtained. Risks and benefits: risks, benefits and alternatives were discussed Consent given by: patient Patient identity confirmed: verbally with patient Time out: Immediately prior to procedure a "time out" was called to verify the correct patient, procedure, equipment, support staff and site/side marked as required. Indications: evaluation for subarachnoid hemorrhage Anesthesia: local infiltration Local anesthetic: lidocaine 1% with epinephrine Patient sedated: no Preparation: Patient was prepped and draped in the usual sterile fashion. Lumbar space: L4-L5 interspace Patient's position: sitting Needle gauge: 20 Needle type: spinal needle - Quincke tip Needle length: 3.5 in Number of attempts: 1 Fluid appearance: clear Tubes of fluid: 4 Total volume: 4 ml Post-procedure: site cleaned and adhesive bandage applied Patient tolerance: Patient tolerated the procedure well with no immediate complications.   (including critical care time)    DIAGNOSTIC STUDIES: Oxygen Saturation is 99% on room air, normal by my interpretation.    COORDINATION OF CARE: 7:56 AM-Discussed treatment plan which includes pain medication and head CT with pt at bedside and pt agreed to plan.     Labs Review Labs Reviewed  BASIC METABOLIC PANEL - Abnormal; Notable for the following:    Glucose, Bld 109 (*)    All other components within normal limits  CSF CELL COUNT WITH DIFFERENTIAL - Abnormal; Notable for the following:    RBC Count, CSF 23 (*)    All other components within normal limits  CSF CELL COUNT WITH DIFFERENTIAL - Abnormal; Notable for the following:    RBC Count, CSF 2 (*)    All other components within normal limits  CSF CULTURE   CBC  PROTIME-INR  APTT  PROTEIN AND GLUCOSE, CSF  LEAD, BLOOD     Imaging Review Ct Head Wo Contrast  07/04/2013   CLINICAL DATA:  Worsening headache began last night, severe this morning, pain extending into back of neck, associated nausea and vomiting swallows photophobia and sweating, history migraines  EXAM: CT HEAD WITHOUT CONTRAST  TECHNIQUE: Contiguous axial images were obtained from the base of the skull through the vertex without intravenous contrast.  COMPARISON:  04/23/2013  FINDINGS: Normal ventricular morphology.  No midline shift or mass effect.  Normal appearance of brain parenchyma.  No intracranial hemorrhage, mass lesion, or acute infarction.  Visualized paranasal sinuses  and mastoid air cells clear.  Bones unremarkable.  IMPRESSION: No acute intracranial abnormalities.   Electronically Signed   By: Ulyses Southward M.D.   On: 07/04/2013 09:01    EKG Interpretation   None       MDM   1. Headache     Labs and imaging reviewed and neg. Headache improved. Discussed diagnosis of SAH and necessity of LP to rule out occult bleeding in patient with 'worst headache of his life'. Pt consents verbally to procedure. Pt also now reporting that he has been cleaning lead paint off an old house recently without any protective gear. This may be contributing to his headache. Will check lead level, advised that results may not be back for 1-2 days.   12:45 PM LP results unremarkable. Pt feeling better, advised rest, fluids and PCP followup if symptoms continue.   I personally performed the services described in this documentation, which was scribed in my presence. The recorded information has been reviewed and is accurate.      David B. Bernette Mayers, MD 07/04/13 1245

## 2013-07-06 LAB — LEAD, BLOOD: Lead-Whole Blood: 7 ug/dL (ref <10)

## 2013-07-08 LAB — CSF CULTURE W GRAM STAIN: Culture: NO GROWTH

## 2013-07-29 ENCOUNTER — Emergency Department: Payer: Self-pay | Admitting: Emergency Medicine

## 2013-07-29 LAB — COMPREHENSIVE METABOLIC PANEL
Anion Gap: 5 — ABNORMAL LOW (ref 7–16)
BUN: 10 mg/dL (ref 7–18)
Bilirubin,Total: 0.3 mg/dL (ref 0.2–1.0)
Calcium, Total: 8.9 mg/dL (ref 8.5–10.1)
EGFR (Non-African Amer.): >60
Glucose: 104 mg/dL — ABNORMAL HIGH (ref 65–99)
Osmolality: 277 (ref 275–301)
SGPT (ALT): 34 U/L (ref 12–78)
Sodium: 139 mmol/L (ref 136–145)
Total Protein: 6.8 g/dL (ref 6.4–8.2)

## 2013-07-29 LAB — CBC
HGB: 13.4 g/dL (ref 13.0–18.0)
MCH: 30.4 pg (ref 26.0–34.0)
MCV: 90 fL (ref 80–100)
Platelet: 284 10*3/uL (ref 150–440)
RDW: 13.2 % (ref 11.5–14.5)

## 2013-09-23 ENCOUNTER — Emergency Department: Payer: Self-pay | Admitting: Emergency Medicine

## 2013-10-02 ENCOUNTER — Emergency Department (HOSPITAL_COMMUNITY)
Admission: EM | Admit: 2013-10-02 | Discharge: 2013-10-02 | Disposition: A | Discharge status: Home / Self Care | Payer: Medicaid Other | Attending: Emergency Medicine | Admitting: Emergency Medicine

## 2013-10-02 ENCOUNTER — Emergency Department (HOSPITAL_COMMUNITY): Payer: Medicaid Other

## 2013-10-02 ENCOUNTER — Encounter (HOSPITAL_COMMUNITY): Payer: Self-pay | Admitting: Emergency Medicine

## 2013-10-02 DIAGNOSIS — Y9289 Other specified places as the place of occurrence of the external cause: Secondary | ICD-10-CM | POA: Insufficient documentation

## 2013-10-02 DIAGNOSIS — F17200 Nicotine dependence, unspecified, uncomplicated: Secondary | ICD-10-CM | POA: Insufficient documentation

## 2013-10-02 DIAGNOSIS — Y93H3 Activity, building and construction: Secondary | ICD-10-CM | POA: Insufficient documentation

## 2013-10-02 DIAGNOSIS — S335XXA Sprain of ligaments of lumbar spine, initial encounter: Secondary | ICD-10-CM | POA: Insufficient documentation

## 2013-10-02 DIAGNOSIS — Y990 Civilian activity done for income or pay: Secondary | ICD-10-CM | POA: Insufficient documentation

## 2013-10-02 DIAGNOSIS — J45901 Unspecified asthma with (acute) exacerbation: Secondary | ICD-10-CM | POA: Insufficient documentation

## 2013-10-02 DIAGNOSIS — J45909 Unspecified asthma, uncomplicated: Secondary | ICD-10-CM

## 2013-10-02 DIAGNOSIS — W1789XA Other fall from one level to another, initial encounter: Secondary | ICD-10-CM | POA: Insufficient documentation

## 2013-10-02 DIAGNOSIS — S39012A Strain of muscle, fascia and tendon of lower back, initial encounter: Secondary | ICD-10-CM

## 2013-10-02 MED ORDER — ALBUTEROL SULFATE HFA 108 (90 BASE) MCG/ACT IN AERS
2.0000 | INHALATION_SPRAY | RESPIRATORY_TRACT | Status: DC | PRN
  Administered 2013-10-02: 2 via RESPIRATORY_TRACT
  Filled 2013-10-02: qty 6.7

## 2013-10-02 MED ORDER — IPRATROPIUM BROMIDE 0.02 % IN SOLN: 0.5000 mg | Freq: Once | RESPIRATORY_TRACT | Status: DC

## 2013-10-02 MED ORDER — HYDROCODONE-ACETAMINOPHEN 5-325 MG PO TABS
2.0000 | ORAL_TABLET | Freq: Once | ORAL | Status: AC
  Administered 2013-10-02: 2 via ORAL
  Filled 2013-10-02: qty 2

## 2013-10-02 MED ORDER — ALBUTEROL SULFATE (2.5 MG/3ML) 0.083% IN NEBU
2.5000 mg | INHALATION_SOLUTION | Freq: Once | RESPIRATORY_TRACT | Status: AC
  Administered 2013-10-02: 2.5 mg via RESPIRATORY_TRACT
  Filled 2013-10-02: qty 3

## 2013-10-02 MED ORDER — ALBUTEROL SULFATE HFA 108 (90 BASE) MCG/ACT IN AERS: 2.0000 | INHALATION_SPRAY | RESPIRATORY_TRACT | Status: DC | PRN

## 2013-10-02 MED ORDER — PREDNISONE 50 MG PO TABS
60.0000 mg | ORAL_TABLET | Freq: Once | ORAL | Status: AC
  Administered 2013-10-02: 60 mg via ORAL
  Filled 2013-10-02 (×2): qty 1

## 2013-10-02 MED ORDER — IPRATROPIUM-ALBUTEROL 0.5-2.5 (3) MG/3ML IN SOLN
3.0000 mL | Freq: Once | RESPIRATORY_TRACT | Status: AC
  Administered 2013-10-02: 3 mL via RESPIRATORY_TRACT
  Filled 2013-10-02: qty 3

## 2013-10-02 MED ORDER — ALBUTEROL SULFATE (2.5 MG/3ML) 0.083% IN NEBU: 5.0000 mg | INHALATION_SOLUTION | Freq: Once | RESPIRATORY_TRACT | Status: DC

## 2013-10-02 MED ORDER — PREDNISONE 20 MG PO TABS: 60.0000 mg | ORAL_TABLET | Freq: Every day | ORAL | Status: DC

## 2013-10-02 MED ORDER — HYDROCODONE-ACETAMINOPHEN 5-325 MG PO TABS: 2.0000 | ORAL_TABLET | ORAL | Status: DC | PRN

## 2013-10-02 NOTE — Discharge Instructions (Signed)
Asthma, Adult Asthma is a condition of the lungs in which the airways tighten and narrow. Asthma can make it hard to breathe. Asthma cannot be cured, but medicine and lifestyle changes can help control it. Asthma may be started (triggered) by:  Animal skin flakes (dander).  Dust.  Cockroaches.  Pollen.  Mold.  Smoke.  Cleaning products.  Hair sprays or aerosol sprays.  Paint fumes or strong smells.  Cold air, weather changes, and winds.  Crying or laughing hard.  Stress.  Certain medicines or drugs.  Foods, such as dried fruit, potato chips, and sparkling grape juice.  Infections or conditions (colds, flu).  Exercise.  Certain medical conditions or diseases.  Exercise or tiring activities. HOME CARE   Take medicine as told by your doctor.  Use a peak flow meter as told by your doctor. A peak flow meter is a tool that measures how well the lungs are working.  Record and keep track of the peak flow meter's readings.  Understand and use the asthma action plan. An asthma action plan is a written plan for taking care of your asthma and treating your attacks.  To help prevent asthma attacks:  Do not smoke. Stay away from secondhand smoke.  Change your heating and air conditioning filter often.  Limit your use of fireplaces and wood stoves.  Get rid of pests (such as roaches and mice) and their droppings.  Throw away plants if you see mold on them.  Clean your floors. Dust regularly. Use cleaning products that do not smell.  Have someone vacuum when you are not home. Use a vacuum cleaner with a HEPA filter if possible.  Replace carpet with wood, tile, or vinyl flooring. Carpet can trap animal skin flakes and dust.  Use allergy-proof pillows, mattress covers, and box spring covers.  Wash bed sheets and blankets every week in hot water and dry them in a dryer.  Use blankets that are made of polyester or cotton.  Clean bathrooms and kitchens with bleach.  If possible, have someone repaint the walls in these rooms with mold-resistant paint. Keep out of the rooms that are being cleaned and painted.  Wash hands often. GET HELP IF:  You have make a whistling sound when breaking (wheeze), have shortness of breath, or have a cough even if taking medicine to prevent attacks.  The colored mucus you cough up (sputum) is thicker than usual.  The colored mucus you cough up changes from clear or white to yellow, green, gray, or bloody.  You have problems from the medicine you are taking such as:  A rash.  Itching.  Swelling.  Trouble breathing.  You need reliever medicines more than 2 3 times a week.  Your peak flow measurement is still at 50 79% of your personal best after following the action plan for 1 hour. GET HELP RIGHT AWAY IF:   You seem to be worse and are not responding to medicine during an asthma attack.  You are short of breath even at rest.  You get short of breath when doing very little activity.  You have trouble eating, drinking, or talking.  You have chest pain.  You have a fast heartbeat.  Your lips or fingernails start to turn blue.  You are lightheaded, dizzy, or faint.  Your peak flow is less than 50% of your personal best.  You have a fever or lasting symptoms for more than 2 3 days.  You have a fever and your symptoms suddenly  get worse. MAKE SURE YOU:   Understand these instructions.  Will watch your condition.  Will get help right away if you are not doing well or get worse. Document Released: 02/02/2008 Document Revised: 06/06/2013 Document Reviewed: 03/15/2013 [facility] Patient Information 2014 [place], [place].  Lumbosacral Strain Lumbosacral strain is a strain of any of the parts that make up your lumbosacral vertebrae. Your lumbosacral vertebrae are the bones that make up the lower third of your backbone. Your lumbosacral vertebrae are held together by muscles and tough, fibrous tissue  (ligaments).  CAUSES  A sudden blow to your back can cause lumbosacral strain. Also, anything that causes an excessive stretch of the muscles in the low back can cause this strain. This is typically seen when people exert themselves strenuously, fall, lift heavy objects, bend, or crouch repeatedly. RISK FACTORS  Physically demanding work.  Participation in pushing or pulling sports or sports that require sudden twist of the back (tennis, golf, baseball).  Weight lifting.  Excessive lower back curvature.  Forward-tilted pelvis.  Weak back or abdominal muscles or both.  Tight hamstrings. SIGNS AND SYMPTOMS  Lumbosacral strain may cause pain in the area of your injury or pain that moves (radiates) down your leg.  DIAGNOSIS Your health care provider can often diagnose lumbosacral strain through a physical exam. In some cases, you may need tests such as X-ray exams.  TREATMENT  Treatment for your lower back injury depends on many factors that your clinician will have to evaluate. However, most treatment will include the use of anti-inflammatory medicines. HOME CARE INSTRUCTIONS   Avoid hard physical activities (tennis, racquetball, waterskiing) if you are not in proper physical condition for it. This may aggravate or create problems.  If you have a back problem, avoid sports requiring sudden body movements. Swimming and walking are generally safer activities.  Maintain good posture.  Maintain a healthy weight.  For acute conditions, you may put ice on the injured area.  Put ice in a plastic bag.  Place a towel between your skin and the bag.  Leave the ice on for 20 minutes, 2 3 times a day.  When the low back starts healing, stretching and strengthening exercises may be recommended. SEEK MEDICAL CARE IF:  Your back pain is getting worse.  You experience severe back pain not relieved with medicines. SEEK IMMEDIATE MEDICAL CARE IF:   You have numbness, tingling, weakness,  or problems with the use of your arms or legs.  There is a change in bowel or bladder control.  You have increasing pain in any area of the body, including your belly (abdomen).  You notice shortness of breath, dizziness, or feel faint.  You feel sick to your stomach (nauseous), are throwing up (vomiting), or become sweaty.  You notice discoloration of your toes or legs, or your feet get very cold. MAKE SURE YOU:   Understand these instructions.  Will watch your condition.  Will get help right away if you are not doing well or get worse. Document Released: 05/26/2005 Document Revised: 06/06/2013 Document Reviewed: 04/04/2013 [facility] HomeExitCare Patient Information 2014 [place], [place].

## 2013-10-02 NOTE — ED Provider Notes (Signed)
CSN: [id number]     Arrival date & time 10/02/13  1915 History   First MD Initiated Contact with Patient 10/02/13 1932     Chief Complaint  Patient presents with  . Chest Pain  . Shortness of Breath   (Consider location/radiation/quality/duration/timing/severity/associated sxs/prior Treatment) HPI Comments: Presents to ER for evaluation of progressively worsening shortness of breath and chest discomfort with her period one week. Patient has a history of asthma, has been out of his inhaler for a week. He has had increasing wheezing over this period of time. He has had a cough which is nonproductive. No fever. 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 Initially was having pain in the left lower back, but he is wondering if he can injure his chest as well. The pain does not radiate to the legs. No loss of strength or sensation in the lower extremities.  Patient is a 38 y.o. male presenting with chest pain and shortness of breath.  Chest Pain Associated symptoms: back pain and shortness of breath   Shortness of Breath Associated symptoms: chest pain     Past Medical History  Diagnosis Date  . Asthma    Past Surgical History  Procedure Laterality Date  . Tonsillectomy     Family History  Problem Relation Age of Onset  . Cancer Mother   . Diabetes Father   . Cancer Other   . Stroke Other   . Diabetes Other    History  Substance Use Topics  . Smoking status: Current Every Day Smoker -- 0.50 packs/day for 20 years    Types: Cigarettes  . Smokeless tobacco: Former Neurosurgeon  . Alcohol Use: Yes     Comment: occ    Review of Systems  Respiratory: Positive for shortness of breath.   Cardiovascular: Positive for chest pain.  Musculoskeletal: Positive for back pain.  All other systems reviewed and are negative.    Allergies  Review of patient's allergies indicates no known allergies.  Home  Medications   Current Outpatient Rx  Name  Route  Sig  Dispense  Refill  . albuterol (PROVENTIL HFA;VENTOLIN HFA) 108 (90 BASE) MCG/ACT inhaler   Inhalation   Inhale 2 puffs into the lungs every 4 (four) hours as needed for wheezing or shortness of breath.         . naproxen (NAPROSYN) 500 MG tablet   Oral   Take 1 tablet (500 mg total) by mouth every 12 (twelve) hours as needed for headache.   30 tablet   0    BP 155/68  Pulse 98  Temp(Src) 98.2 F (36.8 C) (Oral)  Resp 20  Ht 5\' 11"  (1.803 m)  Wt 205 lb (92.987 kg)  BMI 28.60 kg/m2  SpO2 98% Physical Exam  Constitutional: He is oriented to person, place, and time. He appears well-developed and well-nourished. No distress.  HENT:  Head: Normocephalic and atraumatic.  Right Ear: Hearing normal.  Left Ear: Hearing normal.  Nose: Nose normal.  Mouth/Throat: Oropharynx is clear and moist and mucous membranes are normal.  Eyes: Conjunctivae and EOM are normal. Pupils are equal, round, and reactive to light.  Neck: Normal range of motion. Neck supple.  Cardiovascular: Regular rhythm, S1 normal and S2 normal.  Exam reveals no gallop and no friction rub.   No murmur heard. Pulmonary/Chest: Effort normal. No respiratory distress. He has decreased breath sounds. He  has wheezes. He exhibits no tenderness.  Abdominal: Soft. Normal appearance and bowel sounds are normal. There is no hepatosplenomegaly. There is no tenderness. There is no rebound, no guarding, no tenderness at McBurney's point and negative Murphy's sign. No hernia.  Musculoskeletal: Normal range of motion.       Thoracic back: He exhibits no bony tenderness.       Lumbar back: He exhibits no bony tenderness.       Arms: Neurological: He is alert and oriented to person, place, and time. He has normal strength. No cranial nerve deficit or sensory deficit. Coordination normal. GCS eye subscore is 4. GCS verbal subscore is 5. GCS motor subscore is 6.  Skin: Skin is warm,  dry and intact. No rash noted. No cyanosis.  Psychiatric: He has a normal mood and affect. His speech is normal and behavior is normal. Thought content normal.    ED Course  Procedures (including critical care time) Labs Review Labs Reviewed - No data to display Imaging Review Dg Chest 2 View  10/02/2013   CLINICAL DATA:  Cough, shortness of breath.  EXAM: CHEST  2 VIEW  COMPARISON:  DG CHEST 2V dated 11/19/2012  FINDINGS: The heart size and mediastinal contours are within normal limits. Both lungs are clear. The visualized skeletal structures are unremarkable. No pneumothorax.  IMPRESSION: No active cardiopulmonary disease.   Electronically Signed   By: Charlett NoseKevin  Dover M.D.   On: 10/02/2013 20:20   Dg Lumbar Spine Complete  10/02/2013   CLINICAL DATA:  Fall, back pain.  EXAM: LUMBAR SPINE - COMPLETE 4+ VIEW  COMPARISON:  DG LUMBAR SPINE COMPLETE dated 04/23/2013  FINDINGS: There is no evidence of lumbar spine fracture. Alignment is normal. Intervertebral disc spaces are maintained.  IMPRESSION: Negative.   Electronically Signed   By: Charlett NoseKevin  Dover M.D.   On: 10/02/2013 20:37    EKG Interpretation    Date/Time:  Tuesday October 02 2013 19:46:06 EST Ventricular Rate:  72 PR Interval:  136 QRS Duration: 86 QT Interval:  376 QTC Calculation: 411 R Axis:   50 Text Interpretation:  Normal sinus rhythm with sinus arrhythmia Normal ECG When compared with ECG of 10-Nov-2012 09:09, Nonspecific T wave abnormality no longer evident in Lateral leads Confirmed by Vollie Aaron  MD, Brexlee Heberlein ([id number]) on 10/02/2013 7:54:24 PM            MDM  Diagnosis: 1. Lumbar strain 2. Asthma  Patient presents to the ER for evaluation of difficulty breathing. Patient reports previous history of asthma and has been out of his albuterol. Upon arrival to the ER he had bronchospasm presently diffuse wheezing. He is not hypoxic. Patient treated with Demerol and Atrovent with improvement. Chest x-ray does not show any acute  lung pathology. Will continue bronchodilators with prednisone.  Patient also complains of back pain. He had a fall earlier in the week. Pain is paraspinal on the left side without midline tenderness and no radiation. X-ray of the lumbar region does not show fracture.   Gilda Creasehristopher J. Jovanka Westgate, MD 10/02/13 [phone]

## 2013-10-02 NOTE — ED Notes (Signed)
Pt states he has been out of his inhaler for his asthma, states he did fall today at work and feels like he has injury to his left chest and some tingling down left arm. Pt states he feels sob like with his asthma

## 2013-10-09 ENCOUNTER — Emergency Department (HOSPITAL_COMMUNITY)
Admission: EM | Admit: 2013-10-09 | Discharge: 2013-10-09 | Disposition: A | Discharge status: Home / Self Care | Payer: Medicaid Other | Attending: Emergency Medicine | Admitting: Emergency Medicine

## 2013-10-09 ENCOUNTER — Encounter (HOSPITAL_COMMUNITY): Payer: Self-pay | Admitting: Emergency Medicine

## 2013-10-09 DIAGNOSIS — Y990 Civilian activity done for income or pay: Secondary | ICD-10-CM | POA: Insufficient documentation

## 2013-10-09 DIAGNOSIS — J111 Influenza due to unidentified influenza virus with other respiratory manifestations: Secondary | ICD-10-CM | POA: Insufficient documentation

## 2013-10-09 DIAGNOSIS — J45909 Unspecified asthma, uncomplicated: Secondary | ICD-10-CM | POA: Insufficient documentation

## 2013-10-09 DIAGNOSIS — Y9301 Activity, walking, marching and hiking: Secondary | ICD-10-CM | POA: Insufficient documentation

## 2013-10-09 DIAGNOSIS — F17200 Nicotine dependence, unspecified, uncomplicated: Secondary | ICD-10-CM | POA: Insufficient documentation

## 2013-10-09 DIAGNOSIS — Y9289 Other specified places as the place of occurrence of the external cause: Secondary | ICD-10-CM | POA: Insufficient documentation

## 2013-10-09 DIAGNOSIS — X500XXA Overexertion from strenuous movement or load, initial encounter: Secondary | ICD-10-CM | POA: Insufficient documentation

## 2013-10-09 DIAGNOSIS — S335XXA Sprain of ligaments of lumbar spine, initial encounter: Secondary | ICD-10-CM | POA: Insufficient documentation

## 2013-10-09 DIAGNOSIS — Z79899 Other long term (current) drug therapy: Secondary | ICD-10-CM | POA: Insufficient documentation

## 2013-10-09 DIAGNOSIS — S39012A Strain of muscle, fascia and tendon of lower back, initial encounter: Secondary | ICD-10-CM

## 2013-10-09 DIAGNOSIS — IMO0002 Reserved for concepts with insufficient information to code with codable children: Secondary | ICD-10-CM | POA: Insufficient documentation

## 2013-10-09 MED ORDER — DIAZEPAM 5 MG PO TABS
5.0000 mg | ORAL_TABLET | Freq: Once | ORAL | Status: AC
Start: 2013-10-09 — End: 2013-10-09
  Administered 2013-10-09: 5 mg via ORAL
  Filled 2013-10-09: qty 1

## 2013-10-09 MED ORDER — HYDROCODONE-ACETAMINOPHEN 5-325 MG PO TABS: 2.0000 | ORAL_TABLET | ORAL | Status: DC | PRN

## 2013-10-09 MED ORDER — KETOROLAC TROMETHAMINE 60 MG/2ML IM SOLN
60.0000 mg | Freq: Once | INTRAMUSCULAR | Status: AC
  Administered 2013-10-09: 60 mg via INTRAMUSCULAR
  Filled 2013-10-09: qty 2

## 2013-10-09 MED ORDER — DIAZEPAM 5 MG PO TABS: 5.0000 mg | ORAL_TABLET | Freq: Two times a day (BID) | ORAL | Status: DC

## 2013-10-09 NOTE — Discharge Instructions (Signed)
Influenza, Adult Influenza ("the flu") is a viral infection of the respiratory tract. It occurs more often in winter months because people spend more time in close contact with one another. Influenza can make you feel very sick. Influenza easily spreads from person to person (contagious). CAUSES  Influenza is caused by a virus that infects the respiratory tract. You can catch the virus by breathing in droplets from an infected person's cough or sneeze. You can also catch the virus by touching something that was recently contaminated with the virus and then touching your mouth, nose, or eyes. SYMPTOMS  Symptoms typically last 4 to 10 days and may include:  Fever.  Chills.  Headache, body aches, and muscle aches.  Sore throat.  Chest discomfort and cough.  Poor appetite.  Weakness or feeling tired.  Dizziness.  Nausea or vomiting. DIAGNOSIS  Diagnosis of influenza is often made based on your history and a physical exam. A nose or throat swab test can be done to confirm the diagnosis. RISKS AND COMPLICATIONS You may be at risk for a more severe case of influenza if you smoke cigarettes, have diabetes, have chronic heart disease (such as heart failure) or lung disease (such as asthma), or if you have a weakened immune system. Elderly people and pregnant women are also at risk for more serious infections. The most common complication of influenza is a lung infection (pneumonia). Sometimes, this complication can require emergency medical care and may be life-threatening. PREVENTION  An annual influenza vaccination (flu shot) is the best way to avoid getting influenza. An annual flu shot is now routinely recommended for all adults in the [place] TREATMENT  In mild cases, influenza goes away on its own. Treatment is directed at relieving symptoms. For more severe cases, your caregiver may prescribe antiviral medicines to shorten the sickness. Antibiotic medicines are not effective, because the  infection is caused by a virus, not by bacteria. HOME CARE INSTRUCTIONS  Only take over-the-counter or prescription medicines for pain, discomfort, or fever as directed by your caregiver.  Use a cool mist humidifier to make breathing easier.  Get plenty of rest until your temperature returns to normal. This usually takes 3 to 4 days.  Drink enough fluids to keep your urine clear or pale yellow.  Cover your mouth and nose when coughing or sneezing, and wash your hands well to avoid spreading the virus.  Stay home from work or school until your fever has been gone for at least 1 full day. SEEK MEDICAL CARE IF:   You have chest pain or a deep cough that worsens or produces more mucus.  You have nausea, vomiting, or diarrhea. SEEK IMMEDIATE MEDICAL CARE IF:   You have difficulty breathing, shortness of breath, or your skin or nails turn bluish.  You have severe neck pain or stiffness.  You have a severe headache, facial pain, or earache.  You have a worsening or recurring fever.  You have nausea or vomiting that cannot be controlled. MAKE SURE YOU:  Understand these instructions.  Will watch your condition.  Will get help right away if you are not doing well or get worse. Document Released: 08/13/2000 Document Revised: 02/15/2012 Document Reviewed: 11/15/2011 [facility] Patient Information 2014 [place], [place].   Drink plenty of fluids Rest Heat packs for back pain Valium for muscle spasm Norco for break through pain Take ibuprofen for generalized body aches

## 2013-10-09 NOTE — ED Notes (Signed)
Reports was walking down a ladder today when he felt something pop in his back. Complaining of left lower back pain. Also reports started running a fever and having generalized body aches tonight.

## 2013-10-09 NOTE — ED Provider Notes (Signed)
CSN: [id number]     Arrival date & time 10/09/13  2013 History   First MD Initiated Contact with Patient 10/09/13 2040     Chief Complaint  Patient presents with  . Back Pain  . Generalized Body Aches     (Consider location/radiation/quality/duration/timing/severity/associated sxs/prior Treatment) Patient is a 38 y.o. male presenting with back pain. The history is provided by the patient. No language interpreter was used.  Back Pain Location:  Lumbar spine Quality:  Aching Radiates to:  Does not radiate Pain severity:  Moderate Progression:  Waxing and waning Context: lifting heavy objects, occupational injury and recent injury   Associated symptoms: fever   Associated symptoms: no abdominal pain, no dysuria, no numbness, no paresthesias, no perianal numbness, no tingling and no weakness    Pt is a 38 year old male who presents with a history of a lumbar muscle strain approx 1 week ago. He reports that he was at work today and was carrying a large piece of sheet-rock when he felt something in his back "pop". He denies any numbness, tingling or saddle anesthesia. He reports that that his left lower back is hurting more tonight and he has developed a fever this afternoon which is new. He reports that his fever started today and he has a non-productive cough and is aching all over. He reports that he has generalized body aches and is feeling horrible. He reports that some of the guys at work have been sick recently. He denies any headache, stiff neck or neck pain. No chest pain or difficulty breathing. No N/V/D. He thinks he might be getting the flu.  Past Medical History  Diagnosis Date  . Asthma    Past Surgical History  Procedure Laterality Date  . Tonsillectomy     Family History  Problem Relation Age of Onset  . Cancer Mother   . Diabetes Father   . Cancer Other   . Stroke Other   . Diabetes Other    History  Substance Use Topics  . Smoking status: Current Every Day Smoker  -- 0.50 packs/day for 20 years    Types: Cigarettes  . Smokeless tobacco: Former NeurosurgeonUser  . Alcohol Use: Yes     Comment: occ    Review of Systems  Constitutional: Positive for fever. Negative for chills.  Gastrointestinal: Negative for nausea, vomiting, abdominal pain and diarrhea.  Genitourinary: Negative for dysuria.  Musculoskeletal: Positive for back pain. Negative for gait problem, neck pain and neck stiffness.  Skin: Negative for rash.  Neurological: Negative for tingling, weakness, numbness and paresthesias.  All other systems reviewed and are negative.      Allergies  Review of patient's allergies indicates no known allergies.  Home Medications   Current Outpatient Rx  Name  Route  Sig  Dispense  Refill  . albuterol (PROVENTIL HFA;VENTOLIN HFA) 108 (90 BASE) MCG/ACT inhaler   Inhalation   Inhale 2 puffs into the lungs every 4 (four) hours as needed for wheezing or shortness of breath.         Marland Kitchen. albuterol (PROVENTIL HFA;VENTOLIN HFA) 108 (90 BASE) MCG/ACT inhaler   Inhalation   Inhale 2 puffs into the lungs every 4 (four) hours as needed for wheezing or shortness of breath.   1 Inhaler   2   . HYDROcodone-acetaminophen (NORCO/VICODIN) 5-325 MG per tablet   Oral   Take 2 tablets by mouth every 4 (four) hours as needed for moderate pain.   20 tablet   0   .  naproxen (NAPROSYN) 500 MG tablet   Oral   Take 1 tablet (500 mg total) by mouth every 12 (twelve) hours as needed for headache.   30 tablet   0   . predniSONE (DELTASONE) 20 MG tablet   Oral   Take 3 tablets (60 mg total) by mouth daily with breakfast.   15 tablet   0    BP 122/74  Pulse 99  Temp(Src) 100.1 F (37.8 C) (Oral)  Resp 20  Ht 5\' 11"  (1.803 m)  Wt 205 lb (92.987 kg)  BMI 28.60 kg/m2  SpO2 98% Physical Exam  Nursing note and vitals reviewed. Constitutional: He is oriented to person, place, and time. He appears well-developed and well-nourished. No distress.  HENT:  Head:  Normocephalic and atraumatic.  Right Ear: External ear normal.  Left Ear: External ear normal.  Mouth/Throat: Oropharynx is clear and moist.  Eyes: Conjunctivae and EOM are normal.  Neck: Normal range of motion. Neck supple. No JVD present. No tracheal deviation present. No thyromegaly present.  Cardiovascular: Normal rate, regular rhythm and normal heart sounds.   Pulmonary/Chest: Effort normal and breath sounds normal.  Abdominal: Soft. Bowel sounds are normal. There is no tenderness.  Musculoskeletal:       Arms: Left lumbar, paravertebral tenderness. No midline spinal tenderness, step-offs or deformities. No reported numbness or tingling. Ambulatory without any difficulty. Good strength and coordination. No focal deficits or weakness. No saddle anesthesia or incontinence.     Lymphadenopathy:    He has no cervical adenopathy.  Neurological: He is alert and oriented to person, place, and time. No cranial nerve deficit. Coordination normal.  Skin: Skin is warm and dry.  Psychiatric: He has a normal mood and affect. His behavior is normal. Judgment and thought content normal.    ED Course  Procedures (including critical care time) Labs Review Labs Reviewed - No data to display Imaging Review No results found.  EKG Interpretation   None       MDM   Final diagnoses:  Lumbar strain  Influenza     Re-injury at work after muscle strain of back 1 week ago. Probable beginning of influenza. Fever and generalized body aches with non-productive cough. No meningeal signs. Reassuring exam. No c/o headache or neck pain or stiffness. No numbness, tingling, weakness or focal deficit. Ambulatory without difficulty. Return precautions given. Pt agreed and will return if symptoms worsen.     Irish Elders, NP 10/09/13 2136

## 2013-10-11 NOTE — ED Provider Notes (Signed)
Medical screening examination/treatment/procedure(s) were performed by non-physician practitioner and as supervising physician I was immediately available for consultation/collaboration.  EKG Interpretation   None         Shelda JakesScott W. Kemp Gomes, MD 10/11/13 [phone]

## 2013-10-18 MED FILL — Hydrocodone-Acetaminophen Tab 5-325 MG: ORAL | Qty: 6 | Status: AC

## 2013-11-11 ENCOUNTER — Emergency Department: Payer: Self-pay | Admitting: Emergency Medicine

## 2013-11-21 ENCOUNTER — Encounter (HOSPITAL_COMMUNITY): Payer: Self-pay | Admitting: Emergency Medicine

## 2013-11-21 ENCOUNTER — Emergency Department (HOSPITAL_COMMUNITY)
Admission: EM | Admit: 2013-11-21 | Discharge: 2013-11-21 | Disposition: A | Discharge status: Home / Self Care | Payer: Medicaid Other | Attending: Emergency Medicine | Admitting: Emergency Medicine

## 2013-11-21 DIAGNOSIS — F17200 Nicotine dependence, unspecified, uncomplicated: Secondary | ICD-10-CM | POA: Insufficient documentation

## 2013-11-21 DIAGNOSIS — Z79899 Other long term (current) drug therapy: Secondary | ICD-10-CM | POA: Insufficient documentation

## 2013-11-21 DIAGNOSIS — J45901 Unspecified asthma with (acute) exacerbation: Secondary | ICD-10-CM | POA: Insufficient documentation

## 2013-11-21 DIAGNOSIS — K029 Dental caries, unspecified: Secondary | ICD-10-CM | POA: Insufficient documentation

## 2013-11-21 DIAGNOSIS — IMO0002 Reserved for concepts with insufficient information to code with codable children: Secondary | ICD-10-CM | POA: Insufficient documentation

## 2013-11-21 MED ORDER — IBUPROFEN 800 MG PO TABS: 800.0000 mg | ORAL_TABLET | Freq: Three times a day (TID) | ORAL | Status: DC

## 2013-11-21 MED ORDER — AMOXICILLIN 500 MG PO CAPS: 500.0000 mg | ORAL_CAPSULE | Freq: Three times a day (TID) | ORAL | Status: DC

## 2013-11-21 MED ORDER — IBUPROFEN 800 MG PO TABS
800.0000 mg | ORAL_TABLET | Freq: Once | ORAL | Status: AC
  Administered 2013-11-21: 800 mg via ORAL
  Filled 2013-11-21: qty 1

## 2013-11-21 MED ORDER — ACETAMINOPHEN 325 MG PO TABS
650.0000 mg | ORAL_TABLET | Freq: Once | ORAL | Status: AC
  Administered 2013-11-21: 650 mg via ORAL
  Filled 2013-11-21: qty 2

## 2013-11-21 MED ORDER — BUPIVACAINE-EPINEPHRINE PF 0.5-1:200000 % IJ SOLN
INTRAMUSCULAR | Status: AC
  Filled 2013-11-21: qty 10

## 2013-11-21 MED ORDER — AMOXICILLIN 250 MG PO CAPS
500.0000 mg | ORAL_CAPSULE | Freq: Once | ORAL | Status: AC
  Administered 2013-11-21: 500 mg via ORAL
  Filled 2013-11-21: qty 2

## 2013-11-21 NOTE — ED Notes (Signed)
Pt ask for nerve block, PA aware, ordered Bupivacaine with Epi, came back to the room pt left.

## 2013-11-21 NOTE — ED Notes (Signed)
Pt left without signing, pt has script

## 2013-11-21 NOTE — ED Provider Notes (Signed)
CSN: [id number]     Arrival date & time 11/21/13  1832 History   First MD Initiated Contact with Patient 11/21/13 2041     Chief Complaint  Patient presents with  . Dental Pain     (Consider location/radiation/quality/duration/timing/severity/associated sxs/prior Treatment) Patient is a 38 y.o. male presenting with tooth pain. The history is provided by the patient.  Dental Pain Location:  Lower Quality:  Aching Severity:  Moderate Onset quality:  Gradual Duration:  4 weeks (Pt has had pain for 4 week, worse for 3 days.) Timing:  Intermittent Progression:  Worsening Chronicity:  Chronic Context: dental caries and poor dentition   Relieved by:  Nothing Exacerbated by: eating. Associated symptoms: facial pain and gum swelling   Associated symptoms: no difficulty swallowing and no neck pain   Risk factors: smoking     Past Medical History  Diagnosis Date  . Asthma    Past Surgical History  Procedure Laterality Date  . Tonsillectomy     Family History  Problem Relation Age of Onset  . Cancer Mother   . Diabetes Father   . Cancer Other   . Stroke Other   . Diabetes Other    History  Substance Use Topics  . Smoking status: Current Every Day Smoker -- 0.50 packs/day for 20 years    Types: Cigarettes  . Smokeless tobacco: Former Neurosurgeon  . Alcohol Use: Yes     Comment: occ    Review of Systems  Constitutional: Negative for activity change.       All ROS Neg except as noted in HPI  HENT: Positive for dental problem. Negative for nosebleeds.   Eyes: Negative for photophobia and discharge.  Respiratory: Positive for wheezing. Negative for cough and shortness of breath.   Cardiovascular: Negative for chest pain and palpitations.  Gastrointestinal: Negative for abdominal pain and blood in stool.  Genitourinary: Negative for dysuria, frequency and hematuria.  Musculoskeletal: Negative for arthralgias, back pain and neck pain.  Skin: Negative.   Neurological: Negative  for dizziness, seizures and speech difficulty.  Psychiatric/Behavioral: Negative for hallucinations and confusion.      Allergies  Review of patient's allergies indicates no known allergies.  Home Medications   Current Outpatient Rx  Name  Route  Sig  Dispense  Refill  . albuterol (PROVENTIL HFA;VENTOLIN HFA) 108 (90 BASE) MCG/ACT inhaler   Inhalation   Inhale 2 puffs into the lungs every 4 (four) hours as needed for wheezing or shortness of breath.         Marland Kitchen albuterol (PROVENTIL HFA;VENTOLIN HFA) 108 (90 BASE) MCG/ACT inhaler   Inhalation   Inhale 2 puffs into the lungs every 4 (four) hours as needed for wheezing or shortness of breath.   1 Inhaler   2   . diazepam (VALIUM) 5 MG tablet   Oral   Take 1 tablet (5 mg total) by mouth 2 (two) times daily.   10 tablet   0   . HYDROcodone-acetaminophen (NORCO/VICODIN) 5-325 MG per tablet   Oral   Take 2 tablets by mouth every 4 (four) hours as needed for moderate pain.   20 tablet   0   . HYDROcodone-acetaminophen (NORCO/VICODIN) 5-325 MG per tablet   Oral   Take 2 tablets by mouth every 4 (four) hours as needed.   6 tablet   0   . HYDROcodone-acetaminophen (NORCO/VICODIN) 5-325 MG per tablet   Oral   Take 2 tablets by mouth every 4 (four) hours as needed.  10 tablet   0   . naproxen (NAPROSYN) 500 MG tablet   Oral   Take 1 tablet (500 mg total) by mouth every 12 (twelve) hours as needed for headache.   30 tablet   0   . predniSONE (DELTASONE) 20 MG tablet   Oral   Take 3 tablets (60 mg total) by mouth daily with breakfast.   15 tablet   0    BP 121/72  Pulse 95  Temp(Src) 98.8 F (37.1 C) (Oral)  Resp 20  Ht 5\' 11"  (1.803 m)  Wt 200 lb (90.719 kg)  BMI 27.91 kg/m2  SpO2 97% Physical Exam  Nursing note and vitals reviewed. Constitutional: He is oriented to person, place, and time. He appears well-developed and well-nourished.  Non-toxic appearance.  HENT:  Head: Normocephalic.  Right Ear:  Tympanic membrane and external ear normal.  Left Ear: Tympanic membrane and external ear normal.  Mouth/Throat:    Eyes: EOM and lids are normal. Pupils are equal, round, and reactive to light.  Neck: Normal range of motion. Neck supple. Carotid bruit is not present.  Cardiovascular: Normal rate, regular rhythm, normal heart sounds, intact distal pulses and normal pulses.   Pulmonary/Chest: Breath sounds normal. No respiratory distress.  Abdominal: Soft. Bowel sounds are normal. There is no tenderness. There is no guarding.  Musculoskeletal: Normal range of motion.  Lymphadenopathy:       Head (right side): No submandibular adenopathy present.       Head (left side): No submandibular adenopathy present.    He has no cervical adenopathy.  Neurological: He is alert and oriented to person, place, and time. He has normal strength. No cranial nerve deficit or sensory deficit.  Skin: Skin is warm and dry.  Psychiatric: He has a normal mood and affect. His speech is normal.    ED Course  Procedures (including critical care time) Labs Review Labs Reviewed - No data to display Imaging Review No results found.   EKG Interpretation None      MDM Vital signs are well within normal limits. Patient has multiple dental caries, but one of the decayed teeth at the right lower jaw area has some swelling around the gum. No visible abscess appreciated. No swelling under the tongue. No evidence for Ludwig's angina Rx for amoxil and ibuprofen 800mg    Final diagnoses:  None    *I have reviewed nursing notes, vital signs, and all appropriate lab and imaging results for this patient.    Kathie DikeHobson M Lemmie Vanlanen, PA-C 11/21/13 2101

## 2013-11-21 NOTE — ED Provider Notes (Signed)
Medical screening examination/treatment/procedure(s) were performed by non-physician practitioner and as supervising physician I was immediately available for consultation/collaboration.   EKG Interpretation None        Yassmine Tamm B. Kristene Liberati, MD 11/21/13 2323 

## 2013-11-21 NOTE — ED Notes (Signed)
Patient given discharge instruction, verbalized understand. Patient ambulatory out of the department.  

## 2013-11-21 NOTE — ED Notes (Signed)
Pt c/o toothache for the past 4 weeks.  THis morning noticed some puffiness under r eye.

## 2013-11-21 NOTE — Discharge Instructions (Signed)
Dental Caries °Dental caries is tooth decay. This decay can cause a hole in teeth (cavity) that can get bigger and deeper over time. °HOME CARE °· Brush and floss your teeth. Do this at least two times a day. °· Use a fluoride toothpaste. °· Use a mouth rinse if told by your dentist or doctor. °· Eat less sugary and starchy foods. Drink less sugary drinks. °· Avoid snacking often on sugary and starchy foods. Avoid sipping often on sugary drinks. °· Keep regular checkups and cleanings with your dentist. °· Use fluoride supplements if told by your dentist or doctor. °· Allow fluoride to be applied to teeth if told by your dentist or doctor. °MAKE SURE YOU: °· Understand these instructions. °· Will watch your condition. °· Will get help right away if you are not doing well or get worse. °Document Released: 05/25/2008 Document Revised: 04/18/2013 Document Reviewed: 08/18/2012 °Exit[employer]® Patient Information ©2014 [employer]. ° °

## 2013-12-16 ENCOUNTER — Emergency Department (HOSPITAL_COMMUNITY)
Admission: EM | Admit: 2013-12-16 | Discharge: 2013-12-16 | Disposition: A | Discharge status: Home / Self Care | Payer: Medicaid Other | Attending: Emergency Medicine | Admitting: Emergency Medicine

## 2013-12-16 ENCOUNTER — Encounter (HOSPITAL_COMMUNITY): Payer: Self-pay | Admitting: Emergency Medicine

## 2013-12-16 ENCOUNTER — Emergency Department (HOSPITAL_COMMUNITY): Payer: Medicaid Other

## 2013-12-16 DIAGNOSIS — Z79899 Other long term (current) drug therapy: Secondary | ICD-10-CM | POA: Insufficient documentation

## 2013-12-16 DIAGNOSIS — S300XXA Contusion of lower back and pelvis, initial encounter: Secondary | ICD-10-CM

## 2013-12-16 DIAGNOSIS — F17200 Nicotine dependence, unspecified, uncomplicated: Secondary | ICD-10-CM | POA: Insufficient documentation

## 2013-12-16 DIAGNOSIS — W1809XA Striking against other object with subsequent fall, initial encounter: Secondary | ICD-10-CM | POA: Insufficient documentation

## 2013-12-16 DIAGNOSIS — Y929 Unspecified place or not applicable: Secondary | ICD-10-CM | POA: Insufficient documentation

## 2013-12-16 DIAGNOSIS — S139XXA Sprain of joints and ligaments of unspecified parts of neck, initial encounter: Secondary | ICD-10-CM | POA: Insufficient documentation

## 2013-12-16 DIAGNOSIS — S199XXA Unspecified injury of neck, initial encounter: Secondary | ICD-10-CM

## 2013-12-16 DIAGNOSIS — S0993XA Unspecified injury of face, initial encounter: Secondary | ICD-10-CM | POA: Insufficient documentation

## 2013-12-16 DIAGNOSIS — Y9389 Activity, other specified: Secondary | ICD-10-CM | POA: Insufficient documentation

## 2013-12-16 DIAGNOSIS — J45909 Unspecified asthma, uncomplicated: Secondary | ICD-10-CM | POA: Insufficient documentation

## 2013-12-16 DIAGNOSIS — S20229A Contusion of unspecified back wall of thorax, initial encounter: Secondary | ICD-10-CM | POA: Insufficient documentation

## 2013-12-16 MED ORDER — CYCLOBENZAPRINE HCL 10 MG PO TABS
10.0000 mg | ORAL_TABLET | Freq: Once | ORAL | Status: AC
  Administered 2013-12-16: 10 mg via ORAL
  Filled 2013-12-16: qty 1

## 2013-12-16 MED ORDER — OXYCODONE-ACETAMINOPHEN 5-325 MG PO TABS
1.0000 | ORAL_TABLET | Freq: Once | ORAL | Status: AC
  Administered 2013-12-16: 1 via ORAL
  Filled 2013-12-16: qty 1

## 2013-12-16 MED ORDER — NAPROXEN 500 MG PO TABS: 500.0000 mg | ORAL_TABLET | Freq: Two times a day (BID) | ORAL | Status: DC

## 2013-12-16 MED ORDER — CYCLOBENZAPRINE HCL 10 MG PO TABS: 10.0000 mg | ORAL_TABLET | Freq: Three times a day (TID) | ORAL | Status: DC | PRN

## 2013-12-16 MED ORDER — OXYCODONE-ACETAMINOPHEN 7.5-325 MG PO TABS: 1.0000 | ORAL_TABLET | ORAL | Status: DC | PRN

## 2013-12-16 NOTE — Discharge Instructions (Signed)
Cervical Sprain °A cervical sprain is when the tissues (ligaments) that hold the neck bones in place stretch or tear. °HOME CARE  °· Put ice on the injured area. °· Put ice in a plastic bag. °· Place a towel between your skin and the bag. °· Leave the ice on for 15 20 minutes, 3 4 times a day. °· You may have been given a collar to wear. This collar keeps your neck from moving while you heal. °· Do not take the collar off unless told by your doctor. °· If you have long hair, keep it outside of the collar. °· Ask your doctor before changing the position of your collar. You may need to change its position over time to make it more comfortable. °· If you are allowed to take off the collar for cleaning or bathing, follow your doctor's instructions on how to do it safely. °· Keep your collar clean by wiping it with mild soap and water. Dry it completely. If the collar has removable pads, remove them every 1 2 days to hand wash them with soap and water. Allow them to air dry. They should be dry before you wear them in the collar. °· Do not drive while wearing the collar. °· Only take medicine as told by your doctor. °· Keep all doctor visits as told. °· Keep all physical therapy visits as told. °· Adjust your work station so that you have good posture while you work. °· Avoid positions and activities that make your problems worse. °· Warm up and stretch before being active. °GET HELP IF: °· Your pain is not controlled with medicine. °· You cannot take less pain medicine over time as planned. °· Your activity level does not improve as expected. °GET HELP RIGHT AWAY IF:  °· You are bleeding. °· Your stomach is upset. °· You have an allergic reaction to your medicine. °· You develop new problems that you cannot explain. °· You lose feeling (become numb) or you cannot move any part of your body (paralysis). °· You have tingling or weakness in any part of your body. °· Your symptoms get worse. Symptoms include: °· Pain,  soreness, stiffness, puffiness (swelling), or a burning feeling in your neck. °· Pain when your neck is touched. °· Shoulder or upper back pain. °· Limited ability to move your neck. °· Headache. °· Dizziness. °· Your hands or arms feel week, lose feeling, or tingle. °· Muscle spasms. °· Difficulty swallowing or chewing. °MAKE SURE YOU:  °· Understand these instructions. °· Will watch your condition. °· Will get help right away if you are not doing well or get worse. °Document Released: 02/02/2008 Document Revised: 04/18/2013 Document Reviewed: 02/21/2013 °Exit[employer]® Patient Information ©2014 [employer]. ° °

## 2013-12-16 NOTE — ED Notes (Addendum)
Per patient was helping neighbor move couch last night, as he was going up steps with couch he fell back hitting back and neck. Patient c/o lower back pain and neck pain. Patient reports hitting head but denies LOC or blurred vision.

## 2013-12-17 NOTE — ED Provider Notes (Signed)
Medical screening examination/treatment/procedure(s) were performed by non-physician practitioner and as supervising physician I was immediately available for consultation/collaboration.   EKG Interpretation None       David HornJohn M Aalina Brege, MD 12/17/13 2204

## 2013-12-19 NOTE — ED Provider Notes (Signed)
CSN: [id number]     Arrival date & time 12/16/13  1822 History   First MD Initiated Contact with Patient 12/16/13 1835     Chief Complaint  Patient presents with  . Fall  . Back Pain  . Neck Pain     (Consider location/radiation/quality/duration/timing/severity/associated sxs/prior Treatment) HPI Comments: David Leach is a 38 y.o. male who presents to the Emergency Department complaining of lower back and neck pain after a fall.  Patient states that he was helping somone move a sofa up some steps and fell backwards, striking his lower back on the steps.  He also states that his neck "jerked".  Injury occurred one day prior to ED arrival.  He states the pain is worse with movement and improves with rest.  He denies numbness or weakness of the extremities, headaches, dizziness, LOC or visual changes.   He has taken OTC medications w/o relief.  He also denies vomiting or incontinence/ retention of urine or feces.  Patient is a 38 y.o. male presenting with back pain and neck pain. The history is provided by the patient.  Back Pain Associated symptoms: no abdominal pain, no dysuria, no fever, no headaches, no numbness and no weakness   Neck Pain Associated symptoms: no fever, no headaches, no numbness and no weakness     Past Medical History  Diagnosis Date  . Asthma    Past Surgical History  Procedure Laterality Date  . Tonsillectomy     Family History  Problem Relation Age of Onset  . Cancer Mother   . Diabetes Father   . Cancer Other   . Stroke Other   . Diabetes Other    History  Substance Use Topics  . Smoking status: Current Every Day Smoker -- 0.50 packs/day for 20 years    Types: Cigarettes  . Smokeless tobacco: Former Neurosurgeon    Types: Chew  . Alcohol Use: Yes     Comment: occ    Review of Systems  Constitutional: Negative for fever.  Respiratory: Negative for shortness of breath.   Gastrointestinal: Negative for vomiting, abdominal pain and constipation.   Genitourinary: Negative for dysuria, hematuria, flank pain, decreased urine volume and difficulty urinating.       No perineal numbness or incontinence of urine or feces  Musculoskeletal: Positive for back pain and neck pain. Negative for joint swelling and neck stiffness.  Skin: Negative for rash.  Neurological: Negative for dizziness, syncope, weakness, numbness and headaches.  All other systems reviewed and are negative.     Allergies  Review of patient's allergies indicates no known allergies.  Home Medications   Prior to Admission medications   Medication Sig Start Date End Date Taking? Authorizing Provider  albuterol (PROVENTIL HFA;VENTOLIN HFA) 108 (90 BASE) MCG/ACT inhaler Inhale 2 puffs into the lungs every 4 (four) hours as needed for wheezing or shortness of breath.   Yes Historical Provider, MD  ibuprofen (ADVIL,MOTRIN) 800 MG tablet Take 1 tablet (800 mg total) by mouth 3 (three) times daily. 11/21/13  Yes [name], PA-C  cyclobenzaprine (FLEXERIL) 10 MG tablet Take 1 tablet (10 mg total) by mouth 3 (three) times daily as needed. 12/16/13   Greysin Medlen L. Navreet Bolda, PA-C  HYDROcodone-acetaminophen (NORCO/VICODIN) 5-325 MG per tablet Take 1 tablet by mouth every 6 (six) hours as needed. For up to 15 doses starting on 11/25/2013 11/25/13   Historical Provider, MD  naproxen (NAPROSYN) 500 MG tablet Take 1 tablet (500 mg total) by mouth 2 (two)  times daily. 12/16/13   Prestin Munch L. Butch Otterson, PA-C  oxyCODONE-acetaminophen (PERCOCET) 7.5-325 MG per tablet Take 1 tablet by mouth every 4 (four) hours as needed for pain. 12/16/13   Assata Juncaj L. Jazzlin Clements, PA-C   BP 142/87  Pulse 94  Temp(Src) 99.1 F (37.3 C) (Oral)  Resp 16  Ht 5\' 11"  (1.803 m)  Wt 205 lb (92.987 kg)  BMI 28.60 kg/m2  SpO2 99% Physical Exam  Nursing note and vitals reviewed. Constitutional: He is oriented to person, place, and time. He appears well-developed and well-nourished. No distress.  HENT:  Head: Normocephalic and  atraumatic.  Mouth/Throat: Oropharynx is clear and moist.  Eyes: EOM are normal. Pupils are equal, round, and reactive to light.  Neck: Normal range of motion. Neck supple.  c-collar applied at triage.  Pt has diffuse ttp of the cervical spine and paraspinal muscles.  No step off deformities.  5/5 strength of the bilateral UE's  Cardiovascular: Normal rate, regular rhythm, normal heart sounds and intact distal pulses.   No murmur heard. Pulmonary/Chest: Effort normal and breath sounds normal. No respiratory distress.  Abdominal: Soft. He exhibits no distension. There is no tenderness. There is no rebound and no guarding.  Musculoskeletal: He exhibits tenderness. He exhibits no edema.       Lumbar back: He exhibits tenderness and pain. He exhibits normal range of motion, no swelling, no deformity, no laceration and normal pulse.  ttp of the lower lumbar spine and  paraspinal muscles.    DP pulses are brisk and symmetrical.  Distal sensation intact.  Hip Flexors/Extensors are intact.  Pt has normal strength against resistance of bilateral lower extremities.     Neurological: He is alert and oriented to person, place, and time. He has normal strength. No sensory deficit. He exhibits normal muscle tone. Coordination and gait normal.  Reflex Scores:      Patellar reflexes are 2+ on the right side and 2+ on the left side.      Achilles reflexes are 2+ on the right side and 2+ on the left side. Skin: Skin is warm and dry. No rash noted.    ED Course  Procedures (including critical care time) Labs Review Labs Reviewed - No data to display  Imaging Review Dg Cervical Spine Complete  12/16/2013   CLINICAL DATA:  Fall  EXAM: CERVICAL SPINE - COMPLETE 4+ VIEW  COMPARISON:  None.  FINDINGS: Anatomic alignment. No vertebral compression deformity. Unremarkable prevertebral soft tissues. Moderate narrowing of the C6-7 disc space. Uncovertebral osteophyte at C5-6 and C6-7 cause mild right foraminal  narrowing. Little if any left foraminal narrowing. Intact odontoid.  IMPRESSION: No acute bony pathology. Mild degenerative change in the lower cervical spine.   Electronically Signed   By: Maryclare BeanArt  Hoss M.D.   On: 12/16/2013 19:31   Dg Lumbar Spine Complete  12/16/2013   CLINICAL DATA:  Pain post trauma  EXAM: LUMBAR SPINE - COMPLETE 4+ VIEW  COMPARISON:  October 02, 2013  FINDINGS: Frontal, lateral, spot lumbosacral lateral, and bilateral oblique views were obtained. There are 5 non-rib-bearing lumbar type vertebral bodies. There is no fracture or spondylolisthesis. Disc spaces appear intact. There is no appreciable facet arthropathy.  IMPRESSION: No fracture or appreciable arthropathy.   Electronically Signed   By: Bretta BangWilliam  Woodruff M.D.   On: 12/16/2013 19:28    EKG Interpretation None      MDM   Final diagnoses:  Lumbar contusion  Cervical sprain    Collar removed after review  of the XR's.  Pt is feeling better after treatment in the dept.  Remains NV intact.  He agrees to symptomatic treatment with flexeril, naprosyn and percocet  He also agrees to close f//u with his PMD or with orthopedics in one week if the sx's are not improving.  Likely musculoskeletal injuries.  VSS.  He appears stable for d/c and ambulates with a steady gait.      Sharona Rovner L. Trisha Mangleriplett, PA-C 12/19/13 1233

## 2013-12-20 NOTE — ED Provider Notes (Signed)
Medical screening examination/treatment/procedure(s) were performed by non-physician practitioner and as supervising physician I was immediately available for consultation/collaboration.   EKG Interpretation None       David HornJohn M Shauntelle Jamerson, MD 12/20/13 2142

## 2013-12-22 ENCOUNTER — Emergency Department (HOSPITAL_COMMUNITY)
Admission: EM | Admit: 2013-12-22 | Discharge: 2013-12-22 | Disposition: A | Discharge status: Home / Self Care | Payer: Medicaid Other | Attending: Emergency Medicine | Admitting: Emergency Medicine

## 2013-12-22 ENCOUNTER — Encounter (HOSPITAL_COMMUNITY): Payer: Self-pay | Admitting: Emergency Medicine

## 2013-12-22 DIAGNOSIS — J45909 Unspecified asthma, uncomplicated: Secondary | ICD-10-CM | POA: Insufficient documentation

## 2013-12-22 DIAGNOSIS — T148XXA Other injury of unspecified body region, initial encounter: Secondary | ICD-10-CM

## 2013-12-22 DIAGNOSIS — M545 Low back pain, unspecified: Secondary | ICD-10-CM | POA: Insufficient documentation

## 2013-12-22 DIAGNOSIS — Z791 Long term (current) use of non-steroidal anti-inflammatories (NSAID): Secondary | ICD-10-CM | POA: Insufficient documentation

## 2013-12-22 DIAGNOSIS — G8911 Acute pain due to trauma: Secondary | ICD-10-CM | POA: Insufficient documentation

## 2013-12-22 DIAGNOSIS — Z79899 Other long term (current) drug therapy: Secondary | ICD-10-CM | POA: Insufficient documentation

## 2013-12-22 DIAGNOSIS — F17200 Nicotine dependence, unspecified, uncomplicated: Secondary | ICD-10-CM | POA: Insufficient documentation

## 2013-12-22 DIAGNOSIS — Z87828 Personal history of other (healed) physical injury and trauma: Secondary | ICD-10-CM | POA: Insufficient documentation

## 2013-12-22 MED ORDER — ONDANSETRON HCL 4 MG PO TABS
4.0000 mg | ORAL_TABLET | Freq: Once | ORAL | Status: AC
  Administered 2013-12-22: 4 mg via ORAL
  Filled 2013-12-22: qty 1

## 2013-12-22 MED ORDER — METHOCARBAMOL 500 MG PO TABS: ORAL_TABLET | ORAL | Status: DC

## 2013-12-22 MED ORDER — DICLOFENAC SODIUM 75 MG PO TBEC
75.0000 mg | DELAYED_RELEASE_TABLET | Freq: Two times a day (BID) | ORAL | Status: DC
Start: 2013-12-22 — End: 2014-02-23

## 2013-12-22 MED ORDER — KETOROLAC TROMETHAMINE 10 MG PO TABS
10.0000 mg | ORAL_TABLET | Freq: Once | ORAL | Status: AC
  Administered 2013-12-22: 10 mg via ORAL
  Filled 2013-12-22: qty 1

## 2013-12-22 MED ORDER — DEXAMETHASONE 6 MG PO TABS: ORAL_TABLET | ORAL | Status: DC

## 2013-12-22 MED ORDER — DIAZEPAM 5 MG PO TABS
5.0000 mg | ORAL_TABLET | Freq: Once | ORAL | Status: AC
  Administered 2013-12-22: 5 mg via ORAL
  Filled 2013-12-22: qty 1

## 2013-12-22 MED ORDER — PREDNISONE 50 MG PO TABS
60.0000 mg | ORAL_TABLET | Freq: Once | ORAL | Status: AC
  Administered 2013-12-22: 23:00:00 60 mg via ORAL
  Filled 2013-12-22 (×2): qty 1

## 2013-12-22 NOTE — Discharge Instructions (Signed)
Muscle Strain  A muscle strain (pulled muscle) happens when a muscle is stretched beyond normal length. It happens when a sudden, violent force stretches your muscle too far. Usually, a few of the fibers in your muscle are torn. Muscle strain is common in athletes. Recovery usually takes 1 2 weeks. Complete healing takes 5 6 weeks.   HOME CARE    Follow the PRICE method of treatment to help your injury get better. Do this the first 2 3 days after the injury:   Protect. Protect the muscle to keep it from getting injured again.   Rest. Limit your activity and rest the injured body part.   Ice. Put ice in a plastic bag. Place a towel between your skin and the bag. Then, apply the ice and leave it on from 15 20 minutes each hour. After the third day, switch to moist heat packs.   Compression. Use a splint or elastic bandage on the injured area for comfort. Do not put it on too tightly.   Elevate. Keep the injured body part above the level of your heart.   Only take medicine as told by your doctor.   Warm up before doing exercise to prevent future muscle strains.  GET HELP IF:    You have more pain or puffiness (swelling) in the injured area.   You feel numbness, tingling, or notice a loss of strength in the injured area.  MAKE SURE YOU:    Understand these instructions.   Will watch your condition.   Will get help right away if you are not doing well or get worse.  Document Released: 05/25/2008 Document Revised: 06/06/2013 Document Reviewed: 03/15/2013  [employer] Patient Information 2014 [employer].

## 2013-12-22 NOTE — ED Notes (Signed)
Pt c/o lower right sided back pain that radiates down right leg. Pt is out of his Percocet 7.5-325.

## 2013-12-22 NOTE — ED Provider Notes (Signed)
CSN: [id number]     Arrival date & time 12/22/13  2238 History   First MD Initiated Contact with Patient 12/22/13 2250     Chief Complaint  Patient presents with  . Back Pain     (Consider location/radiation/quality/duration/timing/severity/associated sxs/prior Treatment) HPI Comments: Patient is a 38 year old male who presents to the emergency department with a complaint of back pain. The patient states that on April 18 he was helping a neighbor move a couch when he fell down some steps and injured his lower back and neck. He was seen in the emergency department on April 19, and x-rays were negative for fracture or dislocation. The patient was treated with medication and advised to see an orthopedic specialist. The patient states that he cannot see the specialist until Friday, May 1. The patient states that he has to work, and he has been working over last few days. He was attempting to assist with putting up drywall today when he reinjured his lower back. He presents now to cost he is out of medication, he is hurting more, and he wants to be evaluated. The patient denies any loss of bowel or bladder function while putting up drywall today. He denies any perirectal numbness, and he has not had any additional falls since being evaluated in the emergency department.  Patient is a 38 y.o. male presenting with back pain. The history is provided by the patient.  Back Pain Associated symptoms: no abdominal pain, no chest pain and no dysuria     Past Medical History  Diagnosis Date  . Asthma    Past Surgical History  Procedure Laterality Date  . Tonsillectomy     Family History  Problem Relation Age of Onset  . Cancer Mother   . Diabetes Father   . Cancer Other   . Stroke Other   . Diabetes Other    History  Substance Use Topics  . Smoking status: Current Every Day Smoker -- 0.50 packs/day for 20 years    Types: Cigarettes  . Smokeless tobacco: Former NeurosurgeonUser    Types: Chew  . Alcohol  Use: Yes     Comment: occ    Review of Systems  Constitutional: Negative for activity change.       All ROS Neg except as noted in HPI  HENT: Negative for nosebleeds.   Eyes: Negative for photophobia and discharge.  Respiratory: Negative for cough, shortness of breath and wheezing.   Cardiovascular: Negative for chest pain and palpitations.  Gastrointestinal: Negative for abdominal pain and blood in stool.  Genitourinary: Negative for dysuria, frequency and hematuria.  Musculoskeletal: Positive for back pain. Negative for arthralgias and neck pain.  Skin: Negative.   Neurological: Negative for dizziness, seizures and speech difficulty.  Psychiatric/Behavioral: Negative for hallucinations and confusion.      Allergies  Review of patient's allergies indicates no known allergies.  Home Medications   Prior to Admission medications   Medication Sig Start Date End Date Taking? Authorizing Provider  albuterol (PROVENTIL HFA;VENTOLIN HFA) 108 (90 BASE) MCG/ACT inhaler Inhale 2 puffs into the lungs every 4 (four) hours as needed for wheezing or shortness of breath.    Historical Provider, MD  cyclobenzaprine (FLEXERIL) 10 MG tablet Take 1 tablet (10 mg total) by mouth 3 (three) times daily as needed. 12/16/13   [name], PA-C  HYDROcodone-acetaminophen (NORCO/VICODIN) 5-325 MG per tablet Take 1 tablet by mouth every 6 (six) hours as needed. For up to 15 doses starting on 11/25/2013 11/25/13  Historical Provider, MD  ibuprofen (ADVIL,MOTRIN) 800 MG tablet Take 1 tablet (800 mg total) by mouth 3 (three) times daily. 11/21/13   Kathie DikeHobson M Seleta Hovland, PA-C  naproxen (NAPROSYN) 500 MG tablet Take 1 tablet (500 mg total) by mouth 2 (two) times daily. 12/16/13   [name], PA-C  oxyCODONE-acetaminophen (PERCOCET) 7.5-325 MG per tablet Take 1 tablet by mouth every 4 (four) hours as needed for pain. 12/16/13   [name], PA-C   BP 124/70  Pulse 96  Temp(Src) 98.3 F (36.8 C)  Resp  20  Ht 5\' 11"  (1.803 m)  Wt 205 lb (92.987 kg)  BMI 28.60 kg/m2  SpO2 100% Physical Exam  Nursing note and vitals reviewed. Constitutional: He is oriented to person, place, and time. He appears well-developed and well-nourished.  Non-toxic appearance.  HENT:  Head: Normocephalic.  Right Ear: Tympanic membrane and external ear normal.  Left Ear: Tympanic membrane and external ear normal.  Eyes: EOM and lids are normal. Pupils are equal, round, and reactive to light.  Neck: Normal range of motion. Neck supple. Carotid bruit is not present.  Cardiovascular: Normal rate, regular rhythm, normal heart sounds, intact distal pulses and normal pulses.   Pulmonary/Chest: Breath sounds normal. No respiratory distress.  Abdominal: Soft. Bowel sounds are normal. There is no tenderness. There is no guarding.  Musculoskeletal: Normal range of motion. He exhibits tenderness.  There is pain with attempted range of motion of the lumbar area. There is paraspinal tightness and tenderness right greater than left. There is no palpable step off appreciated.  Lymphadenopathy:       Head (right side): No submandibular adenopathy present.       Head (left side): No submandibular adenopathy present.    He has no cervical adenopathy.  Neurological: He is alert and oriented to person, place, and time. He has normal strength. No cranial nerve deficit or sensory deficit. He exhibits normal muscle tone. Coordination normal.  Gait is slow but steady. No foot drop noted.  Skin: Skin is warm and dry.  Psychiatric: He has a normal mood and affect. His speech is normal.    ED Course  Procedures (including critical care time) Labs Review Labs Reviewed - No data to display  Imaging Review No results found.   EKG Interpretation None      MDM Patient sustained injury to the lower back on April 18. He was evaluated and treated in the emergency department. The patient went back to work before being seen by the  orthopedic specialist and we injured the lower back. No loss of bowel or bladder function, and no recent falls.  The examination is consistent with a muscle strain of the lower back aggravating a previous injury. The plan at this time is for the patient to be placed on Robaxin, diclofenac, and Decadron. Patient is advised to rest his back. I have reemphasized to the patient the importance of seeing the orthopedic specialist.    Final diagnoses:  None    **I have reviewed nursing notes, vital signs, and all appropriate lab and imaging results for this patient.[name]*    Aimar Borghi M Caterra Ostroff, PA-C 12/22/13 2316

## 2013-12-22 NOTE — ED Provider Notes (Signed)
Medical screening examination/treatment/procedure(s) were performed by non-physician practitioner and as supervising physician I was immediately available for consultation/collaboration.   EKG Interpretation None        Khiry Pasquariello L Shandee Jergens, MD 12/22/13 2337 

## 2014-02-16 ENCOUNTER — Emergency Department (HOSPITAL_COMMUNITY): Payer: Medicaid Other

## 2014-02-16 ENCOUNTER — Emergency Department (HOSPITAL_COMMUNITY)
Admission: EM | Admit: 2014-02-16 | Discharge: 2014-02-16 | Disposition: A | Discharge status: Home / Self Care | Payer: Medicaid Other | Attending: Emergency Medicine | Admitting: Emergency Medicine

## 2014-02-16 ENCOUNTER — Encounter (HOSPITAL_COMMUNITY): Payer: Self-pay | Admitting: Emergency Medicine

## 2014-02-16 DIAGNOSIS — F17200 Nicotine dependence, unspecified, uncomplicated: Secondary | ICD-10-CM | POA: Insufficient documentation

## 2014-02-16 DIAGNOSIS — IMO0002 Reserved for concepts with insufficient information to code with codable children: Secondary | ICD-10-CM | POA: Insufficient documentation

## 2014-02-16 DIAGNOSIS — W19XXXA Unspecified fall, initial encounter: Secondary | ICD-10-CM

## 2014-02-16 DIAGNOSIS — J45909 Unspecified asthma, uncomplicated: Secondary | ICD-10-CM | POA: Insufficient documentation

## 2014-02-16 DIAGNOSIS — Y9389 Activity, other specified: Secondary | ICD-10-CM | POA: Insufficient documentation

## 2014-02-16 DIAGNOSIS — M545 Low back pain, unspecified: Secondary | ICD-10-CM

## 2014-02-16 DIAGNOSIS — S8990XA Unspecified injury of unspecified lower leg, initial encounter: Secondary | ICD-10-CM | POA: Insufficient documentation

## 2014-02-16 DIAGNOSIS — S99929A Unspecified injury of unspecified foot, initial encounter: Secondary | ICD-10-CM

## 2014-02-16 DIAGNOSIS — M25572 Pain in left ankle and joints of left foot: Secondary | ICD-10-CM

## 2014-02-16 DIAGNOSIS — Z791 Long term (current) use of non-steroidal anti-inflammatories (NSAID): Secondary | ICD-10-CM | POA: Insufficient documentation

## 2014-02-16 DIAGNOSIS — Y9289 Other specified places as the place of occurrence of the external cause: Secondary | ICD-10-CM | POA: Insufficient documentation

## 2014-02-16 DIAGNOSIS — S99919A Unspecified injury of unspecified ankle, initial encounter: Secondary | ICD-10-CM

## 2014-02-16 DIAGNOSIS — W11XXXA Fall on and from ladder, initial encounter: Secondary | ICD-10-CM | POA: Insufficient documentation

## 2014-02-16 DIAGNOSIS — Z79899 Other long term (current) drug therapy: Secondary | ICD-10-CM | POA: Insufficient documentation

## 2014-02-16 MED ORDER — OXYCODONE-ACETAMINOPHEN 5-325 MG PO TABS: 1.0000 | ORAL_TABLET | Freq: Four times a day (QID) | ORAL | Status: DC | PRN

## 2014-02-16 MED ORDER — OXYCODONE-ACETAMINOPHEN 5-325 MG PO TABS
2.0000 | ORAL_TABLET | Freq: Once | ORAL | Status: AC
  Administered 2014-02-16: 2 via ORAL
  Filled 2014-02-16: qty 2

## 2014-02-16 NOTE — ED Provider Notes (Signed)
CSN: [id number]     Arrival date & time 02/16/14  1956 History  This chart was scribed for non-physician practitioner working with [name], MD by [name], ED Scribe. This patient was seen in room TR08C/TR08C and the patient's care was started at 11:03 PM.   Chief Complaint  Patient presents with  . Back Pain  . Leg Pain      The history is provided by the patient. No language interpreter was used.   HPI Comments: David Leach is a 38 y.o. male who presents to the Emergency Department after falling from a step ladder two days ago. Patient reports trapping and twisting his right foot in the rungs during the fall and landing on his shoulders upside down. Patient denies LOC or head injury. Patient reports hearing a pop in his back during the fall. Denies shoulder pain.  As result patient now complaining of right leg pain, ankle pain and swelling, numbness and tingling in his right foot and right lower back pain. Patient reports history of lower back that has increased since the fall. Treatment with ibuprofen, tylenol and rest, without relief. Patient is ambulatory with pain.  Patient denies any bowel or bladder incontinence.  Denies vision changes, neck pain, nausea, or vomiting.    Past Medical History  Diagnosis Date  . Asthma    Past Surgical History  Procedure Laterality Date  . Tonsillectomy     Family History  Problem Relation Age of Onset  . Cancer Mother   . Diabetes Father   . Cancer Other   . Stroke Other   . Diabetes Other    History  Substance Use Topics  . Smoking status: Current Every Day Smoker -- 0.50 packs/day for 20 years    Types: Cigarettes  . Smokeless tobacco: Former Neurosurgeon    Types: Chew  . Alcohol Use: Yes     Comment: occ    Review of Systems  Constitutional: Negative for fever and chills.  Eyes: Negative for visual disturbance.  Musculoskeletal: Positive for arthralgias and joint swelling.  Neurological: Negative for weakness,  numbness and headaches.  Psychiatric/Behavioral: Negative for confusion.      Allergies  Review of patient's allergies indicates no known allergies.  Home Medications   Prior to Admission medications   Medication Sig Start Date End Date Taking? Authorizing Provider  albuterol (PROVENTIL HFA;VENTOLIN HFA) 108 (90 BASE) MCG/ACT inhaler Inhale 2 puffs into the lungs every 4 (four) hours as needed for wheezing or shortness of breath.    Historical Provider, MD  cyclobenzaprine (FLEXERIL) 10 MG tablet Take 1 tablet (10 mg total) by mouth 3 (three) times daily as needed. 12/16/13   [name], PA-C  dexamethasone (DECADRON) 6 MG tablet 1 po bid with food 12/22/13   [name], PA-C  diclofenac (VOLTAREN) 75 MG EC tablet Take 1 tablet (75 mg total) by mouth 2 (two) times daily. 12/22/13   [name], PA-C  HYDROcodone-acetaminophen (NORCO/VICODIN) 5-325 MG per tablet Take 1 tablet by mouth every 6 (six) hours as needed. For up to 15 doses starting on 11/25/2013 11/25/13   Historical Provider, MD  ibuprofen (ADVIL,MOTRIN) 800 MG tablet Take 1 tablet (800 mg total) by mouth 3 (three) times daily. 11/21/13   [name], PA-C  methocarbamol (ROBAXIN) 500 MG tablet 2 po tid for spasm pain 12/22/13   [name], PA-C  naproxen (NAPROSYN) 500 MG tablet Take 1 tablet (500 mg total) by mouth 2 (two)  times daily. 12/16/13   [name], PA-C  oxyCODONE-acetaminophen (PERCOCET) 7.5-325 MG per tablet Take 1 tablet by mouth every 4 (four) hours as needed for pain. 12/16/13   [name], PA-C   Triage Vitals: BP 168/86  Pulse 103  Temp(Src) 98.9 F (37.2 C) (Oral)  Resp 18  Ht 5\' 11"  (1.803 m)  Wt 198 lb (89.812 kg)  BMI 27.63 kg/m2  SpO2 98% Physical Exam  Nursing note and vitals reviewed. Constitutional: He is oriented to person, place, and time. He appears well-developed and well-nourished. No distress.  HENT:  Head: Normocephalic and atraumatic.  Eyes: EOM are  normal.  Neck: Neck supple.  Cardiovascular: Normal rate, regular rhythm, normal heart sounds and intact distal pulses.   Pulmonary/Chest: Effort normal and breath sounds normal. No respiratory distress.  Musculoskeletal: Normal range of motion. He exhibits tenderness.  Tenderness to palpation of lumbar spine, no stepoffs or deformity. No tenderness to cervical or thoracic spine. Some swelling of the right lateral malleolus, with scab overlying lateral malleolus. No active drainage. Scab on the right lateral dorsal aspect of the foot. 2+ DP pulse. Distal sensation of the right foot intact. Full ROM of hips and knees. Full ROM of ankle but tenderness to lateral malleolus. No warmth of the lateral malleolus.   Neurological: He is alert and oriented to person, place, and time. No sensory deficit.  Skin: Skin is warm and dry. There is erythema.  Psychiatric: He has a normal mood and affect. His behavior is normal.    ED Course  Procedures (including critical care time) COORDINATION OF CARE: 11:12 PM- Patient offered but declined crutches, stating he has his own pair at home. Discussed treatment plan with patient at bedside and patient agreed to plan.   Labs Review Labs Reviewed - No data to display  Imaging Review Dg Lumbar Spine Complete  02/16/2014   CLINICAL DATA:  Fall from ladder  EXAM: LUMBAR SPINE - COMPLETE 4+ VIEW  COMPARISON:  12/16/2013  FINDINGS: Normal alignment of lumbar vertebral bodies. No loss of vertebral body height or disc height. No pars fracture. No subluxation.  IMPRESSION: Normal lumbar spine radiograph   Electronically Signed   By: [name]  Edmunds M.D.   On: 02/16/2014 22:40   Dg Ankle Complete Right  02/16/2014   CLINICAL DATA:  Fall from ladder  EXAM: RIGHT ANKLE - COMPLETE 3+ VIEW  COMPARISON:  None.  FINDINGS: Ankle mortise intact. The talar dome is normal. No malleolar fracture. The calcaneus is normal. No  IMPRESSION: No calcaneal injury.  No ankle fracture.    Electronically Signed   By: [name]  Edmunds M.D.   On: 02/16/2014 22:39   Dg Foot Complete Right  02/16/2014   CLINICAL DATA:  Fall from ladder  EXAM: RIGHT FOOT COMPLETE - 3+ VIEW  COMPARISON:  None.  FINDINGS: No fracture or dislocation of mid foot or forefoot. The phalanges are normal. The calcaneus is normal. No soft tissue abnormality.  IMPRESSION: No acute osseous abnormality.   Electronically Signed   By: [name]  Edmunds M.D.   On: 02/16/2014 22:40     EKG Interpretation None        MDM   Final diagnoses:  None   Patient presenting after slipping on a ladder.  Xrays today are negative.  Patient is able to ambulate.  Neurovascularly intact.  Feel that the patient is stable for discharge.  Return precautions given.  I personally performed the services described in this documentation, which  was scribed in my presence. The recorded information has been reviewed and is accurate.    Santiago GladHeather Laisure, PA-C 02/19/14 [phone]

## 2014-02-16 NOTE — ED Notes (Signed)
Declined W/C at D/C and was escorted to lobby by RN. 

## 2014-02-16 NOTE — ED Notes (Signed)
Pt presents with Right leg pain and Right side back pain x2 days. Pt states "my Right foot got twisted in a ladder step and I fell on by back." Pt states he felt something in his back pop. Pt ambulatory in triage. Pt has an scabbed over area to Right lateral ankle associated with redness and edema. Pt states his wife "squeezed some puss and infection out of it today."

## 2014-02-19 NOTE — ED Provider Notes (Signed)
Medical screening examination/treatment/procedure(s) were performed by non-physician practitioner and as supervising physician I was immediately available for consultation/collaboration.   EKG Interpretation None       Stephen Rancour, MD 02/19/14 0131 

## 2014-02-23 ENCOUNTER — Encounter (HOSPITAL_COMMUNITY): Payer: Self-pay | Admitting: Emergency Medicine

## 2014-02-23 ENCOUNTER — Emergency Department (HOSPITAL_COMMUNITY)
Admission: EM | Admit: 2014-02-23 | Discharge: 2014-02-23 | Disposition: A | Discharge status: Home / Self Care | Payer: Medicaid Other | Attending: Emergency Medicine | Admitting: Emergency Medicine

## 2014-02-23 DIAGNOSIS — Z79899 Other long term (current) drug therapy: Secondary | ICD-10-CM | POA: Insufficient documentation

## 2014-02-23 DIAGNOSIS — K047 Periapical abscess without sinus: Secondary | ICD-10-CM

## 2014-02-23 DIAGNOSIS — J45909 Unspecified asthma, uncomplicated: Secondary | ICD-10-CM | POA: Insufficient documentation

## 2014-02-23 DIAGNOSIS — Z792 Long term (current) use of antibiotics: Secondary | ICD-10-CM | POA: Insufficient documentation

## 2014-02-23 DIAGNOSIS — F17200 Nicotine dependence, unspecified, uncomplicated: Secondary | ICD-10-CM | POA: Insufficient documentation

## 2014-02-23 DIAGNOSIS — R221 Localized swelling, mass and lump, neck: Secondary | ICD-10-CM

## 2014-02-23 DIAGNOSIS — R220 Localized swelling, mass and lump, head: Secondary | ICD-10-CM | POA: Insufficient documentation

## 2014-02-23 LAB — COMPREHENSIVE METABOLIC PANEL
ALK PHOS: 107 U/L (ref 39–117)
ALT: 21 U/L (ref 0–53)
AST: 21 U/L (ref 0–37)
Albumin: 3.7 g/dL (ref 3.5–5.2)
BUN: 12 mg/dL (ref 6–23)
CO2: 28 meq/L (ref 19–32)
CREATININE: 0.85 mg/dL (ref 0.50–1.35)
Calcium: 9.6 mg/dL (ref 8.4–10.5)
Chloride: 99 mEq/L (ref 96–112)
GFR calc Af Amer: >90 mL/min (ref >90)
Glucose, Bld: 173 mg/dL — ABNORMAL HIGH (ref 70–99)
POTASSIUM: 4.1 meq/L (ref 3.7–5.3)
Sodium: 138 mEq/L (ref 137–147)
Total Bilirubin: 0.5 mg/dL (ref 0.3–1.2)
Total Protein: 7.1 g/dL (ref 6.0–8.3)

## 2014-02-23 LAB — CBC WITH DIFFERENTIAL/PLATELET
Basophils Absolute: 0 10*3/uL (ref 0.0–0.1)
Basophils Relative: 0 % (ref 0–1)
Eosinophils Absolute: 0.2 10*3/uL (ref 0.0–0.7)
Eosinophils Relative: 2 % (ref 0–5)
HEMATOCRIT: 41.8 % (ref 39.0–52.0)
HEMOGLOBIN: 14.3 g/dL (ref 13.0–17.0)
LYMPHS PCT: 20 % (ref 12–46)
Lymphs Abs: 1.6 10*3/uL (ref 0.7–4.0)
MCH: 31.1 pg (ref 26.0–34.0)
MCHC: 34.2 g/dL (ref 30.0–36.0)
MCV: 90.9 fL (ref 78.0–100.0)
MONO ABS: 0.7 10*3/uL (ref 0.1–1.0)
MONOS PCT: 9 % (ref 3–12)
NEUTROS ABS: 5.6 10*3/uL (ref 1.7–7.7)
NEUTROS PCT: 69 % (ref 43–77)
Platelets: 263 10*3/uL (ref 150–400)
RBC: 4.6 MIL/uL (ref 4.22–5.81)
RDW: 12.8 % (ref 11.5–15.5)
WBC: 8.2 10*3/uL (ref 4.0–10.5)

## 2014-02-23 MED ORDER — ONDANSETRON HCL 4 MG/2ML IJ SOLN
4.0000 mg | Freq: Once | INTRAMUSCULAR | Status: AC
  Administered 2014-02-23: 4 mg via INTRAVENOUS
  Filled 2014-02-23: qty 2

## 2014-02-23 MED ORDER — CLINDAMYCIN HCL 300 MG PO CAPS: 300.0000 mg | ORAL_CAPSULE | Freq: Three times a day (TID) | ORAL | Status: DC

## 2014-02-23 MED ORDER — FENTANYL CITRATE 0.05 MG/ML IJ SOLN
100.0000 ug | Freq: Once | INTRAMUSCULAR | Status: AC
  Administered 2014-02-23: 100 ug via INTRAVENOUS
  Filled 2014-02-23: qty 2

## 2014-02-23 MED ORDER — CLINDAMYCIN PHOSPHATE 600 MG/50ML IV SOLN
600.0000 mg | Freq: Once | INTRAVENOUS | Status: AC
  Administered 2014-02-23: 600 mg via INTRAVENOUS
  Filled 2014-02-23: qty 50

## 2014-02-23 MED ORDER — SODIUM CHLORIDE 0.9 % IV SOLN
INTRAVENOUS | Status: DC
  Administered 2014-02-23: 17:00:00 via INTRAVENOUS

## 2014-02-23 MED ORDER — OXYCODONE-ACETAMINOPHEN 7.5-325 MG PO TABS: 1.0000 | ORAL_TABLET | ORAL | Status: DC | PRN

## 2014-02-23 NOTE — ED Notes (Signed)
Pt c/o toothache on right lower side that started a week ago, right facial swelling and headache that started this am, admits to nausea,

## 2014-02-23 NOTE — ED Notes (Signed)
Pt continues to rate pain 8/10. MD made aware.

## 2014-02-23 NOTE — ED Provider Notes (Signed)
CSN: [id number]     Arrival date & time 02/23/14  1628 History   First MD Initiated Contact with Patient 02/23/14 1646   This chart was scribed for Nelia Shiobert L Beaton, MD by Gwenevere AbbotAlexis Brown, ED scribe. This patient was seen in room APA18/APA18 and the patient's care was started at 4:45 PM.  Chief Complaint  Patient presents with  . Headache   HPI HPI Comments:  David Leach is a 38 y.o. male with who presents to the Emergency Department complaining of constant, severe, aching tooth pain on the lower right side, onset 1 week ago, with associated symptoms of right lower facial swelling, gum pain, drainage from the area, and a migraine. Pt reports that he does have a dental appointment scheduled for next week, 07/02.  Pt reports that he occasionally smokes. Pt denies any other symptoms. Wife reports that pt is allergic to Hydrocodone.  Past Medical History  Diagnosis Date  . Asthma    Past Surgical History  Procedure Laterality Date  . Tonsillectomy     Family History  Problem Relation Age of Onset  . Cancer Mother   . Diabetes Father   . Cancer Other   . Stroke Other   . Diabetes Other    History  Substance Use Topics  . Smoking status: Current Every Day Smoker -- 0.50 packs/day for 20 years    Types: Cigarettes  . Smokeless tobacco: Former NeurosurgeonUser    Types: Chew  . Alcohol Use: Yes     Comment: occ    Review of Systems  10 Systems reviewed and are negative for acute change except as noted in the HPI.   Allergies  Hydrocodone  Home Medications   Prior to Admission medications   Medication Sig Start Date End Date Taking? Authorizing Provider  albuterol (PROVENTIL HFA;VENTOLIN HFA) 108 (90 BASE) MCG/ACT inhaler Inhale 2 puffs into the lungs every 4 (four) hours as needed for wheezing or shortness of breath.   Yes Historical Provider, MD  Aspirin-Salicylamide-Caffeine (BC HEADACHE) 325-95-16 MG TABS Take 1 packet by mouth daily as needed (for pain).   Yes Historical Provider, MD   ibuprofen (ADVIL,MOTRIN) 200 MG tablet Take 200 mg by mouth every 6 (six) hours as needed.   Yes Historical Provider, MD  clindamycin (CLEOCIN) 300 MG capsule Take 1 capsule (300 mg total) by mouth 3 (three) times daily. 02/23/14   Nelia Shiobert L Beaton, MD  oxyCODONE-acetaminophen (PERCOCET) 7.5-325 MG per tablet Take 1 tablet by mouth every 4 (four) hours as needed for pain. 02/23/14   Nelia Shiobert L Beaton, MD   BP 121/75  Pulse 95  Temp(Src) 98.2 F (36.8 C) (Oral)  Resp 18  Ht 5\' 11"  (1.803 m)  Wt 198 lb (89.812 kg)  BMI 27.63 kg/m2  SpO2 96% Physical Exam  Nursing note and vitals reviewed. Constitutional: He is oriented to person, place, and time. He appears well-developed and well-nourished. No distress.  HENT:  Head: Normocephalic and atraumatic.    Mouth/Throat:    Tender and swollen  Eyes: Pupils are equal, round, and reactive to light.  Neck: Normal range of motion.  Cardiovascular: Normal rate and intact distal pulses.   Pulmonary/Chest: No respiratory distress.  Abdominal: Normal appearance. He exhibits no distension.  Musculoskeletal: Normal range of motion.  Neurological: He is alert and oriented to person, place, and time. No cranial nerve deficit.  Skin: Skin is warm and dry. No rash noted.  Psychiatric: He has a normal mood and affect. His behavior  is normal.    ED Course  Procedures (including critical care time)  Medications  0.9 %  sodium chloride infusion ( Intravenous New Bag/Given 02/23/14 1709)  clindamycin (CLEOCIN) IVPB 600 mg (0 mg Intravenous Stopped 02/23/14 1743)  fentaNYL (SUBLIMAZE) injection 100 mcg (100 mcg Intravenous Given 02/23/14 1710)  ondansetron (ZOFRAN) injection 4 mg (4 mg Intravenous Given 02/23/14 1709)  fentaNYL (SUBLIMAZE) injection 100 mcg (100 mcg Intravenous Given 02/23/14 1817)   DIAGNOSTIC STUDIES: Oxygen Saturation is 96% on RA, adequate by my interpretation.  COORDINATION OF CARE:  4:50PM Patient / Family / Caregiver informed of  clinical course, understand medical decision-making process, and agree with plan.  Results for orders placed during the hospital encounter of 02/23/14  CBC WITH DIFFERENTIAL      Result Value Ref Range   WBC 8.2  4.0 - 10.5 K/uL   RBC 4.60  4.22 - 5.81 MIL/uL   Hemoglobin 14.3  13.0 - 17.0 g/dL   HCT 16.141.8  09.639.0 - 04.552.0 %   MCV 90.9  78.0 - 100.0 fL   MCH 31.1  26.0 - 34.0 pg   MCHC 34.2  30.0 - 36.0 g/dL   RDW 40.912.8  81.111.5 - 91.415.5 %   Platelets 263  150 - 400 K/uL   Neutrophils Relative % 69  43 - 77 %   Neutro Abs 5.6  1.7 - 7.7 K/uL   Lymphocytes Relative 20  12 - 46 %   Lymphs Abs 1.6  0.7 - 4.0 K/uL   Monocytes Relative 9  3 - 12 %   Monocytes Absolute 0.7  0.1 - 1.0 K/uL   Eosinophils Relative 2  0 - 5 %   Eosinophils Absolute 0.2  0.0 - 0.7 K/uL   Basophils Relative 0  0 - 1 %   Basophils Absolute 0.0  0.0 - 0.1 K/uL  COMPREHENSIVE METABOLIC PANEL      Result Value Ref Range   Sodium 138  137 - 147 mEq/L   Potassium 4.1  3.7 - 5.3 mEq/L   Chloride 99  96 - 112 mEq/L   CO2 28  19 - 32 mEq/L   Glucose, Bld 173 (*) 70 - 99 mg/dL   BUN 12  6 - 23 mg/dL   Creatinine, Ser 7.820.85  0.50 - 1.35 mg/dL   Calcium 9.6  8.4 - 95.610.5 mg/dL   Total Protein 7.1  6.0 - 8.3 g/dL   Albumin 3.7  3.5 - 5.2 g/dL   AST 21  0 - 37 U/L   ALT 21  0 - 53 U/L   Alkaline Phosphatase 107  39 - 117 U/L   Total Bilirubin 0.5  0.3 - 1.2 mg/dL   GFR calc non Af Amer >90  >90 mL/min   GFR calc Af Amer >90  >90 mL/min     EKG Interpretation None     After treatment in the ED the patient feels back to baseline and wants to go home. MDM   Final diagnoses:  Tooth abscess      I personally performed the services described in this documentation, which was scribed in my presence. The recorded information has been reviewed and considered.     Nelia Shiobert L Beaton, MD 02/23/14 [phone]

## 2014-02-23 NOTE — ED Notes (Signed)
MD at bedside to assess pt.

## 2014-02-23 NOTE — Discharge Instructions (Signed)
Abscessed Tooth An abscessed tooth is an infection around your tooth. It may be caused by holes or damage to the tooth (cavity) or a dental disease. An abscessed tooth causes mild to very bad pain in and around the tooth. See your dentist right away if you have tooth or gum pain. HOME CARE  Take your medicine as told. Finish it even if you start to feel better.  Do not drive after taking pain medicine.  Rinse your mouth (gargle) often with salt water ( teaspoon salt in 8 ounces of warm water).  Do not apply heat to the outside of your face. GET HELP RIGHT AWAY IF:   You have a temperature by mouth above 102 F (38.9 C), not controlled by medicine.  You have chills and a very bad headache.  You have problems breathing or swallowing.  Your mouth will not open.  You develop puffiness (swelling) on the neck or around the eye.  Your pain is not helped by medicine.  Your pain is getting worse instead of better. MAKE SURE YOU:   Understand these instructions.  Will watch your condition.  Will get help right away if you are not doing well or get worse. Document Released: 02/02/2008 Document Revised: 11/08/2011 Document Reviewed: 11/24/2010 Exit[employer] Patient Information 2015 [employer]. This information is not intended to replace advice given to you by your health care provider. Make sure you discuss any questions you have with your health care provider.  

## 2014-03-03 ENCOUNTER — Emergency Department (HOSPITAL_COMMUNITY)
Admission: EM | Admit: 2014-03-03 | Discharge: 2014-03-03 | Disposition: A | Discharge status: Home / Self Care | Payer: Medicaid Other | Attending: Emergency Medicine | Admitting: Emergency Medicine

## 2014-03-03 ENCOUNTER — Encounter (HOSPITAL_COMMUNITY): Payer: Self-pay | Admitting: Emergency Medicine

## 2014-03-03 DIAGNOSIS — Z79899 Other long term (current) drug therapy: Secondary | ICD-10-CM | POA: Diagnosis not present

## 2014-03-03 DIAGNOSIS — K08109 Complete loss of teeth, unspecified cause, unspecified class: Secondary | ICD-10-CM | POA: Diagnosis not present

## 2014-03-03 DIAGNOSIS — Z792 Long term (current) use of antibiotics: Secondary | ICD-10-CM | POA: Diagnosis not present

## 2014-03-03 DIAGNOSIS — J45909 Unspecified asthma, uncomplicated: Secondary | ICD-10-CM | POA: Diagnosis not present

## 2014-03-03 DIAGNOSIS — H9209 Otalgia, unspecified ear: Secondary | ICD-10-CM | POA: Insufficient documentation

## 2014-03-03 DIAGNOSIS — K089 Disorder of teeth and supporting structures, unspecified: Secondary | ICD-10-CM | POA: Diagnosis not present

## 2014-03-03 DIAGNOSIS — F17200 Nicotine dependence, unspecified, uncomplicated: Secondary | ICD-10-CM | POA: Insufficient documentation

## 2014-03-03 DIAGNOSIS — K0889 Other specified disorders of teeth and supporting structures: Secondary | ICD-10-CM

## 2014-03-03 DIAGNOSIS — Z9289 Personal history of other medical treatment: Secondary | ICD-10-CM

## 2014-03-03 MED ORDER — OXYCODONE-ACETAMINOPHEN 5-325 MG PO TABS: 1.0000 | ORAL_TABLET | ORAL | Status: DC | PRN

## 2014-03-03 NOTE — ED Provider Notes (Signed)
CSN: [id number]     Arrival date & time 03/03/14  1415 History   First MD Initiated Contact with Patient 03/03/14 1443     Chief Complaint  Patient presents with  . Dental Pain    Patient is a 38 y.o. male presenting with tooth pain. The history is provided by the patient. No language interpreter was used.  Dental Pain Associated symptoms: no fever    This chart was scribed for nurse practitioner working with David HornJohn M Bednar, MD, by David Leach, ED Scribe. This patient was seen in room APFT23/APFT23 and the patient's care was started at 2:54 PM.  David Leach is a 38 y.o. male who presents to the Emergency Department complaining of right lower dental pain with radiating ear pain. Pt reports right lower tooth extraction 6 days ago. Pt reports pain with chewing and "popping" in jaw with eating. He describes the pain as sore. Pt has been taken ibuprofen and amoxicillin. Pt reports he has a half of bottle of amoxicillin left. He reports he is unable to make an appointment with his dentist and hopes to go back during the week.    Past Medical History  Diagnosis Date  . Asthma    Past Surgical History  Procedure Laterality Date  . Tonsillectomy     Family History  Problem Relation Age of Onset  . Cancer Mother   . Diabetes Father   . Cancer Other   . Stroke Other   . Diabetes Other    History  Substance Use Topics  . Smoking status: Current Every Day Smoker -- 0.50 packs/day for 20 years    Types: Cigarettes  . Smokeless tobacco: Former NeurosurgeonUser    Types: Chew  . Alcohol Use: Yes     Comment: occ    Review of Systems  Constitutional: Negative for fever and chills.  HENT: Positive for dental problem and ear pain.   Gastrointestinal: Negative for nausea and vomiting.  All other systems negative  Allergies  Hydrocodone  Home Medications   Prior to Admission medications   Medication Sig Start Date End Date Taking? Authorizing Provider  albuterol (PROVENTIL HFA;VENTOLIN HFA)  108 (90 BASE) MCG/ACT inhaler Inhale 2 puffs into the lungs every 4 (four) hours as needed for wheezing or shortness of breath.    Historical Provider, MD  Aspirin-Salicylamide-Caffeine (BC HEADACHE) 325-95-16 MG TABS Take 1 packet by mouth daily as needed (for pain).    Historical Provider, MD  clindamycin (CLEOCIN) 300 MG capsule Take 1 capsule (300 mg total) by mouth 3 (three) times daily. 02/23/14   David Shiobert L Beaton, MD  ibuprofen (ADVIL,MOTRIN) 200 MG tablet Take 200 mg by mouth every 6 (six) hours as needed.    Historical Provider, MD  oxyCODONE-acetaminophen (PERCOCET) 7.5-325 MG per tablet Take 1 tablet by mouth every 4 (four) hours as needed for pain. 02/23/14   David Shiobert L Beaton, MD    Physical Exam  Nursing note reviewed. Constitutional: He is oriented to person, place, and time. He appears well-developed and well-nourished. No distress.  HENT:  Head: Normocephalic and atraumatic.  Right Ear: External ear normal.  Left Ear: External ear normal.  Mouth/Throat: Uvula is midline, oropharynx is clear and moist and mucous membranes are normal.    There is gum swelling and tenderness in the are of the extraction.   Eyes: Conjunctivae and EOM are normal. Pupils are equal, round, and reactive to light.  Neck: Normal range of motion. Neck supple.  Cardiovascular: Normal rate  and regular rhythm.   Pulmonary/Chest: Effort normal and breath sounds normal. No respiratory distress.  Musculoskeletal: Normal range of motion.  Neurological: He is alert and oriented to person, place, and time.  Skin: Skin is warm and dry.  Psychiatric: He has a normal mood and affect. His behavior is normal.    ED Course  Procedures (including critical care time) 3:04 PM-Discussed treatment plan which includes percocet with pt at bedside and pt agreed to plan.   Labs Review  MDM  38 y.o. male with dental pain s/p surgery. Will treat his pain until he can follow up. He will continue his antibiotics. Discussed  with the patient and all questioned fully answered. He agrees with plan.    Medication List    ASK your doctor about these medications       albuterol 108 (90 BASE) MCG/ACT inhaler  Commonly known as:  PROVENTIL HFA;VENTOLIN HFA  Inhale 2 puffs into the lungs every 4 (four) hours as needed for wheezing or shortness of breath.     BC HEADACHE 325-95-16 MG Tabs  Generic drug:  Aspirin-Salicylamide-Caffeine  Take 1 packet by mouth daily as needed (for pain).     clindamycin 300 MG capsule  Commonly known as:  CLEOCIN  Take 1 capsule (300 mg total) by mouth 3 (three) times daily.     ibuprofen 200 MG tablet  Commonly known as:  ADVIL,MOTRIN  Take 200 mg by mouth every 6 (six) hours as needed.     oxyCODONE-acetaminophen 7.5-325 MG per tablet  Commonly known as:  PERCOCET  Take 1 tablet by mouth every 4 (four) hours as needed for pain.  Ask about: Which instructions should I use?     oxyCODONE-acetaminophen 5-325 MG per tablet  Commonly known as:  ROXICET  Take 1 tablet by mouth every 4 (four) hours as needed for severe pain.  Ask about: Which instructions should I use?        Final diagnoses:  Pain, dental  History of dental surgery    I personally performed the services described in this documentation, which was scribed in my presence. The recorded information has been reviewed and is accurate.      David Leach, [place]NP 03/14/14 2012

## 2014-03-03 NOTE — Discharge Instructions (Signed)
Continue your antibiotics and ibuprofen. Follow up with the dentist who did your oral surgery as soon as possible.

## 2014-03-03 NOTE — ED Notes (Signed)
Pt had tooth pulled on 02/25/2014, states that the dentist did not want to give him any pain medication, still continues to have pain to area where tooth was pulled, has been unable to follow up with dentist due to dentist being out of town,

## 2014-03-04 NOTE — ED Provider Notes (Signed)
Medical screening examination/treatment/procedure(s) were performed by non-physician practitioner and as supervising physician I was immediately available for consultation/collaboration.   EKG Interpretation None       David HornJohn M Marjarie Irion, MD 03/04/14 1230

## 2014-03-08 ENCOUNTER — Emergency Department: Payer: Self-pay | Admitting: Emergency Medicine

## 2014-03-15 NOTE — ED Provider Notes (Signed)
Medical screening examination/treatment/procedure(s) were performed by non-physician practitioner and as supervising physician I was immediately available for consultation/collaboration.   EKG Interpretation None       Emrys Mceachron M Paitlyn Mcclatchey, MD 03/15/14 1658 

## 2014-03-23 ENCOUNTER — Emergency Department (HOSPITAL_COMMUNITY): Payer: Medicaid Other

## 2014-03-23 ENCOUNTER — Encounter (HOSPITAL_COMMUNITY): Payer: Self-pay | Admitting: Emergency Medicine

## 2014-03-23 ENCOUNTER — Emergency Department (HOSPITAL_COMMUNITY)
Admission: EM | Admit: 2014-03-23 | Discharge: 2014-03-23 | Disposition: A | Discharge status: Home / Self Care | Payer: Medicaid Other | Attending: Emergency Medicine | Admitting: Emergency Medicine

## 2014-03-23 DIAGNOSIS — Y9367 Activity, basketball: Secondary | ICD-10-CM | POA: Insufficient documentation

## 2014-03-23 DIAGNOSIS — Y9239 Other specified sports and athletic area as the place of occurrence of the external cause: Secondary | ICD-10-CM | POA: Insufficient documentation

## 2014-03-23 DIAGNOSIS — S99929A Unspecified injury of unspecified foot, initial encounter: Secondary | ICD-10-CM

## 2014-03-23 DIAGNOSIS — S8990XA Unspecified injury of unspecified lower leg, initial encounter: Secondary | ICD-10-CM | POA: Diagnosis present

## 2014-03-23 DIAGNOSIS — S93409A Sprain of unspecified ligament of unspecified ankle, initial encounter: Secondary | ICD-10-CM | POA: Diagnosis not present

## 2014-03-23 DIAGNOSIS — Z792 Long term (current) use of antibiotics: Secondary | ICD-10-CM | POA: Insufficient documentation

## 2014-03-23 DIAGNOSIS — S99919A Unspecified injury of unspecified ankle, initial encounter: Secondary | ICD-10-CM | POA: Diagnosis present

## 2014-03-23 DIAGNOSIS — J45909 Unspecified asthma, uncomplicated: Secondary | ICD-10-CM | POA: Diagnosis not present

## 2014-03-23 DIAGNOSIS — F17200 Nicotine dependence, unspecified, uncomplicated: Secondary | ICD-10-CM | POA: Diagnosis not present

## 2014-03-23 DIAGNOSIS — W219XXA Striking against or struck by unspecified sports equipment, initial encounter: Secondary | ICD-10-CM | POA: Insufficient documentation

## 2014-03-23 DIAGNOSIS — S93402A Sprain of unspecified ligament of left ankle, initial encounter: Secondary | ICD-10-CM

## 2014-03-23 DIAGNOSIS — Y92838 Other recreation area as the place of occurrence of the external cause: Secondary | ICD-10-CM

## 2014-03-23 MED ORDER — MELOXICAM 7.5 MG PO TABS: 7.5000 mg | ORAL_TABLET | Freq: Every day | ORAL | Status: DC

## 2014-03-23 MED ORDER — OXYCODONE-ACETAMINOPHEN 5-325 MG PO TABS
1.0000 | ORAL_TABLET | Freq: Once | ORAL | Status: AC
  Administered 2014-03-23: 1 via ORAL
  Filled 2014-03-23: qty 1

## 2014-03-23 NOTE — Discharge Instructions (Signed)
X-rays negative today. Keep your ankle and your foot elevated. Ice several times a day. Crutches. ASO brace when ambulating. Follow up with orthopedics specialist.    Ankle Sprain An ankle sprain is an injury to the strong, fibrous tissues (ligaments) that hold the bones of your ankle joint together.  CAUSES An ankle sprain is usually caused by a fall or by twisting your ankle. Ankle sprains most commonly occur when you step on the outer edge of your foot, and your ankle turns inward. People who participate in sports are more prone to these types of injuries.  SYMPTOMS   Pain in your ankle. The pain may be present at rest or only when you are trying to stand or walk.  Swelling.  Bruising. Bruising may develop immediately or within 1 to 2 days after your injury.  Difficulty standing or walking, particularly when turning corners or changing directions. DIAGNOSIS  Your caregiver will ask you details about your injury and perform a physical exam of your ankle to determine if you have an ankle sprain. During the physical exam, your caregiver will press on and apply pressure to specific areas of your foot and ankle. Your caregiver will try to move your ankle in certain ways. An X-ray exam may be done to be sure a bone was not broken or a ligament did not separate from one of the bones in your ankle (avulsion fracture).  TREATMENT  Certain types of braces can help stabilize your ankle. Your caregiver can make a recommendation for this. Your caregiver may recommend the use of medicine for pain. If your sprain is severe, your caregiver may refer you to a surgeon who helps to restore function to parts of your skeletal system (orthopedist) or a physical therapist. HOME CARE INSTRUCTIONS   Apply ice to your injury for 1-2 days or as directed by your caregiver. Applying ice helps to reduce inflammation and pain.  Put ice in a plastic bag.  Place a towel between your skin and the bag.  Leave the ice on  for 15-20 minutes at a time, every 2 hours while you are awake.  Only take over-the-counter or prescription medicines for pain, discomfort, or fever as directed by your caregiver.  Elevate your injured ankle above the level of your heart as much as possible for 2-3 days.  If your caregiver recommends crutches, use them as instructed. Gradually put weight on the affected ankle. Continue to use crutches or a cane until you can walk without feeling pain in your ankle.  If you have a plaster splint, wear the splint as directed by your caregiver. Do not rest it on anything harder than a pillow for the first 24 hours. Do not put weight on it. Do not get it wet. You may take it off to take a shower or bath.  You may have been given an elastic bandage to wear around your ankle to provide support. If the elastic bandage is too tight (you have numbness or tingling in your foot or your foot becomes cold and blue), adjust the bandage to make it comfortable.  If you have an air splint, you may blow more air into it or let air out to make it more comfortable. You may take your splint off at night and before taking a shower or bath. Wiggle your toes in the splint several times per day to decrease swelling. SEEK MEDICAL CARE IF:   You have rapidly increasing bruising or swelling.  Your toes feel extremely  cold or you lose feeling in your foot.  Your pain is not relieved with medicine. SEEK IMMEDIATE MEDICAL CARE IF:  Your toes are numb or blue.  You have severe pain that is increasing. MAKE SURE YOU:   Understand these instructions.  Will watch your condition.  Will get help right away if you are not doing well or get worse. Document Released: 08/16/2005 Document Revised: 05/10/2012 Document Reviewed: 08/28/2011 [facility] Patient Information 2015 [place], [place]. This information is not intended to replace advice given to you by your health care provider. Make sure you discuss any questions you have  with your health care provider.

## 2014-03-23 NOTE — ED Provider Notes (Signed)
Medical screening examination/treatment/procedure(s) were performed by non-physician practitioner and as supervising physician I was immediately available for consultation/collaboration.   EKG Interpretation None        Sharifah Champine, MD 03/23/14 2345 

## 2014-03-23 NOTE — ED Notes (Signed)
Pt in c/o left ankle pain after injury while playing basketball, occurred a few days ago, pain has continued since

## 2014-03-23 NOTE — ED Provider Notes (Signed)
CSN: [id number]     Arrival date & time 03/23/14  1634 History  This chart was scribed for non-physician practitioner working with Rolan Bucco, MD by [name], ED Scribe. This patient was seen in room TR04C/TR04C and the patient's care was started at 5:58 PM.   Chief Complaint  Patient presents with  . Ankle Pain     The history is provided by the patient. No language interpreter was used.   HPI Comments: David Leach is a 38 y.o. male who presents to the Emergency Department with a left ankle injury that occurred a three days ago. Patient reports having someone step on his foot causing him to roll his ankle while playing basketball. Patient reports exacerbated pain with applied pressure, bearing weight and movement. Attempted treatment with ibuprofen 800mg  and BC powder, without relief. Patient reports several previous injuries to his left ankle.   Past Medical History  Diagnosis Date  . Asthma    Past Surgical History  Procedure Laterality Date  . Tonsillectomy     Family History  Problem Relation Age of Onset  . Cancer Mother   . Diabetes Father   . Cancer Other   . Stroke Other   . Diabetes Other    History  Substance Use Topics  . Smoking status: Current Every Day Smoker -- 0.50 packs/day for 20 years    Types: Cigarettes  . Smokeless tobacco: Former Neurosurgeon    Types: Chew  . Alcohol Use: Yes     Comment: occ    Review of Systems  Constitutional: Negative for fever and chills.  Musculoskeletal: Positive for arthralgias.  Neurological: Negative for weakness and numbness.      Allergies  Hydrocodone  Home Medications   Prior to Admission medications   Medication Sig Start Date End Date Taking? Authorizing Provider  albuterol (PROVENTIL HFA;VENTOLIN HFA) 108 (90 BASE) MCG/ACT inhaler Inhale 2 puffs into the lungs every 4 (four) hours as needed for wheezing or shortness of breath.    Historical Provider, MD  Aspirin-Salicylamide-Caffeine (BC HEADACHE)  325-95-16 MG TABS Take 1 packet by mouth daily as needed (for pain).    Historical Provider, MD  clindamycin (CLEOCIN) 300 MG capsule Take 1 capsule (300 mg total) by mouth 3 (three) times daily. 02/23/14   Nelia Shi, MD  ibuprofen (ADVIL,MOTRIN) 200 MG tablet Take 200 mg by mouth every 6 (six) hours as needed.    Historical Provider, MD  oxyCODONE-acetaminophen (PERCOCET) 7.5-325 MG per tablet Take 1 tablet by mouth every 4 (four) hours as needed for pain. 02/23/14   Nelia Shi, MD  oxyCODONE-acetaminophen (ROXICET) 5-325 MG per tablet Take 1 tablet by mouth every 4 (four) hours as needed for severe pain. 03/03/14   Hope Orlene Och, NP   Triage Vitals: BP 107/79  Pulse 88  Temp(Src) 98 F (36.7 C) (Oral)  Resp 16  SpO2 98% Physical Exam  Nursing note and vitals reviewed. Constitutional: He is oriented to person, place, and time. He appears well-developed and well-nourished. No distress.  HENT:  Head: Normocephalic and atraumatic.  Eyes: EOM are normal.  Neck: Neck supple.  Cardiovascular: Normal rate.   Pulmonary/Chest: Effort normal. No respiratory distress.  Musculoskeletal: Normal range of motion. He exhibits tenderness.  Tenderness to palpation over medial, lateral malleoli of the left ankle, tenderness extends into the entire dorsal left foot. A lid any range of motion of the left ankle. Joint is stable with negative anterior posterior drawer signs. Dorsal pedal pulses  intact. There is no swelling to the ankle or the foot. Toes are pink, cap refill less than 2 seconds. Achilles tendon is tender but intact  Neurological: He is alert and oriented to person, place, and time.  Skin: Skin is warm and dry.  Psychiatric: He has a normal mood and affect. His behavior is normal.    ED Course  Procedures (including critical care time) COORDINATION OF CARE: 6:05 PM- Discussed treatment plan with patient at bedside and patient agreed to plan.   Labs Review Labs Reviewed - No data to  display  Imaging Review Dg Ankle Complete Left  03/23/2014   CLINICAL DATA:  Pain post trauma  EXAM: LEFT ANKLE COMPLETE - 3+ VIEW  COMPARISON:  October 20, 2012  FINDINGS: Frontal, oblique, and lateral views were obtained. There is no fracture or effusion. Ankle mortise appears intact. There is a small spur arising from the posterior calcaneus. There is a small exostosis arising from dorsal talus, stable.  IMPRESSION: No fracture.  Mortise intact.   Electronically Signed   By: Bretta BangWilliam  Woodruff M.D.   On: 03/23/2014 17:46   Dg Foot Complete Left  03/23/2014   CLINICAL DATA:  Pain post trauma  EXAM: LEFT FOOT - COMPLETE 3+ VIEW  COMPARISON:  None.  FINDINGS: Frontal, oblique, and lateral views were obtained. There is no fracture or dislocation. Joint spaces appear intact. There is a small posterior calcaneal spur. No erosive change.  IMPRESSION: No fracture or dislocation.  No appreciable arthropathic change.   Electronically Signed   By: Bretta BangWilliam  Woodruff M.D.   On: 03/23/2014 17:45     EKG Interpretation None      MDM   Final diagnoses:  Ankle sprain, left, initial encounter   Patient with an ankle and foot injury 2 days ago. X-rays negative. Suspect a sprain. Treated with an ASO brace, crutches, Percocet one tablet in emergency department. [facility]. He is neurovascularly intact, joint is stable. No other injuries.  Filed Vitals:   03/23/14 1714 03/23/14 1818  BP: 107/79 132/64  Pulse: 88 95  Temp: 98 F (36.7 C)   TempSrc: Oral   Resp: 16 20  SpO2: 98% 100%     I personally performed the services described in this documentation, which was scribed in my presence. The recorded information has been reviewed and is accurate.    Lottie Musselatyana A Makyra Corprew, PA-C 03/23/14 1923

## 2014-04-15 ENCOUNTER — Encounter (HOSPITAL_COMMUNITY): Payer: Self-pay | Admitting: Emergency Medicine

## 2014-04-15 ENCOUNTER — Emergency Department (HOSPITAL_COMMUNITY)
Admission: EM | Admit: 2014-04-15 | Discharge: 2014-04-15 | Disposition: A | Discharge status: Home / Self Care | Payer: Medicaid Other | Attending: Emergency Medicine | Admitting: Emergency Medicine

## 2014-04-15 DIAGNOSIS — X500XXA Overexertion from strenuous movement or load, initial encounter: Secondary | ICD-10-CM | POA: Diagnosis not present

## 2014-04-15 DIAGNOSIS — Y9389 Activity, other specified: Secondary | ICD-10-CM | POA: Insufficient documentation

## 2014-04-15 DIAGNOSIS — IMO0002 Reserved for concepts with insufficient information to code with codable children: Secondary | ICD-10-CM | POA: Diagnosis not present

## 2014-04-15 DIAGNOSIS — F17200 Nicotine dependence, unspecified, uncomplicated: Secondary | ICD-10-CM | POA: Insufficient documentation

## 2014-04-15 DIAGNOSIS — S335XXA Sprain of ligaments of lumbar spine, initial encounter: Secondary | ICD-10-CM | POA: Insufficient documentation

## 2014-04-15 DIAGNOSIS — M545 Low back pain, unspecified: Secondary | ICD-10-CM | POA: Diagnosis present

## 2014-04-15 DIAGNOSIS — Z791 Long term (current) use of non-steroidal anti-inflammatories (NSAID): Secondary | ICD-10-CM | POA: Insufficient documentation

## 2014-04-15 DIAGNOSIS — G8929 Other chronic pain: Secondary | ICD-10-CM | POA: Insufficient documentation

## 2014-04-15 DIAGNOSIS — Y929 Unspecified place or not applicable: Secondary | ICD-10-CM | POA: Diagnosis not present

## 2014-04-15 DIAGNOSIS — J45909 Unspecified asthma, uncomplicated: Secondary | ICD-10-CM | POA: Insufficient documentation

## 2014-04-15 DIAGNOSIS — Z79899 Other long term (current) drug therapy: Secondary | ICD-10-CM | POA: Diagnosis not present

## 2014-04-15 DIAGNOSIS — Z792 Long term (current) use of antibiotics: Secondary | ICD-10-CM | POA: Diagnosis not present

## 2014-04-15 DIAGNOSIS — S39012A Strain of muscle, fascia and tendon of lower back, initial encounter: Secondary | ICD-10-CM

## 2014-04-15 MED ORDER — PREDNISONE 10 MG PO TABS: ORAL_TABLET | ORAL | Status: DC

## 2014-04-15 MED ORDER — OXYCODONE-ACETAMINOPHEN 5-325 MG PO TABS: 1.0000 | ORAL_TABLET | ORAL | Status: DC | PRN

## 2014-04-15 MED ORDER — CYCLOBENZAPRINE HCL 5 MG PO TABS: 5.0000 mg | ORAL_TABLET | Freq: Three times a day (TID) | ORAL | Status: DC | PRN

## 2014-04-15 MED ORDER — PREDNISONE 50 MG PO TABS
60.0000 mg | ORAL_TABLET | Freq: Once | ORAL | Status: AC
  Administered 2014-04-15: 60 mg via ORAL
  Filled 2014-04-15 (×2): qty 1

## 2014-04-15 MED ORDER — KETOROLAC TROMETHAMINE 60 MG/2ML IM SOLN: 60.0000 mg | Freq: Once | INTRAMUSCULAR | Status: DC

## 2014-04-15 MED ORDER — CYCLOBENZAPRINE HCL 10 MG PO TABS
10.0000 mg | ORAL_TABLET | Freq: Once | ORAL | Status: AC
  Administered 2014-04-15: 20 mg via ORAL
  Filled 2014-04-15: qty 1

## 2014-04-15 MED ORDER — OXYCODONE-ACETAMINOPHEN 5-325 MG PO TABS
1.0000 | ORAL_TABLET | Freq: Once | ORAL | Status: AC
  Administered 2014-04-15: 1 via ORAL
  Filled 2014-04-15: qty 1

## 2014-04-15 NOTE — ED Notes (Signed)
Back pain since Friday.  Rates pain 9, took ibuprofen and BC with mild relief at 1800.  Took tramadol at 1530 today.

## 2014-04-15 NOTE — Discharge Instructions (Signed)
Back Pain, Adult °Low back pain is very common. About 1 in 5 people have back pain. The cause of low back pain is rarely dangerous. The pain often gets better over time. About half of people with a sudden onset of back pain feel better in just 2 weeks. About 8 in 10 people feel better by 6 weeks.  °CAUSES °Some common causes of back pain include: °· Strain of the muscles or ligaments supporting the spine. °· Wear and tear (degeneration) of the spinal discs. °· Arthritis. °· Direct injury to the back. °DIAGNOSIS °Most of the time, the direct cause of low back pain is not known. However, back pain can be treated effectively even when the exact cause of the pain is unknown. Answering your caregiver's questions about your overall health and symptoms is one of the most accurate ways to make sure the cause of your pain is not dangerous. If your caregiver needs more information, he or she may order lab work or imaging tests (X-rays or MRIs). However, even if imaging tests show changes in your back, this usually does not require surgery. °HOME CARE INSTRUCTIONS °For many people, back pain returns. Since low back pain is rarely dangerous, it is often a condition that people can learn to manage on their own.  °· Remain active. It is stressful on the back to sit or stand in one place. Do not sit, drive, or stand in one place for more than 30 minutes at a time. Take short walks on level surfaces as soon as pain allows. Try to increase the length of time you walk each day. °· Do not stay in bed. Resting more than 1 or 2 days can delay your recovery. °· Do not avoid exercise or work. Your body is made to move. It is not dangerous to be active, even though your back may hurt. Your back will likely heal faster if you return to being active before your pain is gone. °· Pay attention to your body when you  bend and lift. Many people have less discomfort when lifting if they bend their knees, keep the load close to their bodies, and  avoid twisting. Often, the most comfortable positions are those that put less stress on your recovering back. °· Find a comfortable position to sleep. Use a firm mattress and lie on your side with your knees slightly bent. If you lie on your back, put a pillow under your knees. °· Only take over-the-counter or prescription medicines as directed by your caregiver. Over-the-counter medicines to reduce pain and inflammation are often the most helpful. Your caregiver may prescribe muscle relaxant drugs. These medicines help dull your pain so you can more quickly return to your normal activities and healthy exercise. °· Put ice on the injured area. °¨ Put ice in a plastic bag. °¨ Place a towel between your skin and the bag. °¨ Leave the ice on for 15-20 minutes, 03-04 times a day for the first 2 to 3 days. After that, ice and heat may be alternated to reduce pain and spasms. °· Ask your caregiver about trying back exercises and gentle massage. This may be of some benefit. °· Avoid feeling anxious or stressed. Stress increases muscle tension and can worsen back pain. It is important to recognize when you are anxious or stressed and learn ways to manage it. Exercise is a great option. °SEEK MEDICAL CARE IF: °· You have pain that is not relieved with rest or medicine. °· You have pain that does not improve in 1 week. °· You have new symptoms. °· You are generally not feeling well. °SEEK   IMMEDIATE MEDICAL CARE IF:   You have pain that radiates from your back into your legs.  You develop new bowel or bladder control problems.  You have unusual weakness or numbness in your arms or legs.  You develop nausea or vomiting.  You develop abdominal pain.  You feel faint. Document Released: 08/16/2005 Document Revised: 02/15/2012 Document Reviewed: 12/18/2013 [facility] Patient Information 2015 [place], [place]. This information is not intended to replace advice given to you by your health care provider. Make sure you  discuss any questions you have with your health care provider.   Take your next dose of prednisone tomorrow evening.  Use the the other medicines as directed.  Do not drive within 4 hours of taking oxycodone as this will make you drowsy.  Avoid lifting,  Bending,  Twisting or any other activity that worsens your pain over the next week.  Apply an  icepack  to your lower back for 10-15 minutes every 2 hours for the next 2 days.  You should get rechecked if your symptoms are not better over the next 5 days,  Or you develop increased pain,  Weakness in your leg(s) or loss of bladder or bowel function - these are symptoms of a worse injury.

## 2014-04-16 NOTE — ED Provider Notes (Signed)
CSN: [id number]     Arrival date & time 04/15/14  2026 History   First MD Initiated Contact with Patient 04/15/14 2129     Chief Complaint  Patient presents with  . Back Pain     (Consider location/radiation/quality/duration/timing/severity/associated sxs/prior Treatment) The history is provided by the patient.   David Leach is a 38 y.o. male  presenting with acute on chronic low back pain which has which has been present for the past 4 days.   Patient denies any new injury specifically but works in [employer] and for the past week has been lifting heavy drywall sheets causing increased pain across his lower back.  There is no radiation into his lower extremities.  There has been no weakness or numbness in the lower extremities and no urinary or bowel retention or incontinence.  Patient does not have a history of cancer or IVDU.  The patient has tried the followed medicines and/or treatments without relief of pain: ibuprofen, bc powders and tramadol.     Past Medical History  Diagnosis Date  . Asthma    Past Surgical History  Procedure Laterality Date  . Tonsillectomy     Family History  Problem Relation Age of Onset  . Cancer Mother   . Diabetes Father   . Cancer Other   . Stroke Other   . Diabetes Other    History  Substance Use Topics  . Smoking status: Current Every Day Smoker -- 0.50 packs/day for 20 years    Types: Cigarettes  . Smokeless tobacco: Former NeurosurgeonUser    Types: Chew  . Alcohol Use: Yes     Comment: occ    Review of Systems  Constitutional: Negative for fever.  Respiratory: Negative for shortness of breath.   Cardiovascular: Negative for chest pain and leg swelling.  Gastrointestinal: Negative for abdominal pain, constipation and abdominal distention.  Genitourinary: Negative for dysuria, urgency, frequency, flank pain and difficulty urinating.  Musculoskeletal: Positive for back pain. Negative for gait problem and joint swelling.  Skin: Negative  for rash.  Neurological: Negative for weakness and numbness.      Allergies  Hydrocodone  Home Medications   Prior to Admission medications   Medication Sig Start Date End Date Taking? Authorizing Provider  albuterol (PROVENTIL HFA;VENTOLIN HFA) 108 (90 BASE) MCG/ACT inhaler Inhale 2 puffs into the lungs every 4 (four) hours as needed for wheezing or shortness of breath.    Historical Provider, MD  Aspirin-Salicylamide-Caffeine (BC HEADACHE) 325-95-16 MG TABS Take 1 packet by mouth daily as needed (for pain).    Historical Provider, MD  clindamycin (CLEOCIN) 300 MG capsule Take 1 capsule (300 mg total) by mouth 3 (three) times daily. 02/23/14   Nelia Shiobert L Beaton, MD  cyclobenzaprine (FLEXERIL) 5 MG tablet Take 1 tablet (5 mg total) by mouth 3 (three) times daily as needed for muscle spasms. 04/15/14   [name] Kalianna Verbeke, PA-C  ibuprofen (ADVIL,MOTRIN) 200 MG tablet Take 200 mg by mouth every 6 (six) hours as needed.    Historical Provider, MD  meloxicam (MOBIC) 7.5 MG tablet Take 1 tablet (7.5 mg total) by mouth daily. 03/23/14   Tatyana A Kirichenko, PA-C  oxyCODONE-acetaminophen (PERCOCET) 7.5-325 MG per tablet Take 1 tablet by mouth every 4 (four) hours as needed for pain. 02/23/14   Nelia Shiobert L Beaton, MD  oxyCODONE-acetaminophen (PERCOCET/ROXICET) 5-325 MG per tablet Take 1 tablet by mouth every 4 (four) hours as needed. 04/15/14   [name] Kharson Rasmusson, PA-C  oxyCODONE-acetaminophen (ROXICET) 5-325 MG per  tablet Take 1 tablet by mouth every 4 (four) hours as needed for severe pain. 03/03/14   Hope Orlene Och, NP  predniSONE (DELTASONE) 10 MG tablet 6, 5, 4, 3, 2 then 1 tablet by mouth daily for 6 days total. 04/16/14   Burgess Amor, PA-C   BP 133/81  Pulse 68  Temp(Src) 98 F (36.7 C) (Oral)  Resp 22  Ht 5\' 11"  (1.803 m)  Wt 195 lb (88.451 kg)  BMI 27.21 kg/m2  SpO2 99% Physical Exam  Nursing note and vitals reviewed. Constitutional: He appears well-developed and well-nourished.  HENT:  Head: Normocephalic.   Eyes: Conjunctivae are normal.  Neck: Normal range of motion. Neck supple.  Cardiovascular: Normal rate and intact distal pulses.   Pedal pulses normal.  Pulmonary/Chest: Effort normal.  Abdominal: Soft. Bowel sounds are normal. He exhibits no distension and no mass.  Musculoskeletal: Normal range of motion. He exhibits no edema.       Lumbar back: He exhibits tenderness. He exhibits no bony tenderness, no swelling, no edema and no spasm.  Bilateral paralumbar and parasacral tenderness.  No step-offs, palpable deformity, no visible trauma.  Neurological: He is alert. He has normal strength. He displays no atrophy and no tremor. No sensory deficit. Gait normal.  Reflex Scores:      Patellar reflexes are 2+ on the right side and 2+ on the left side.      Achilles reflexes are 2+ on the right side and 2+ on the left side. No strength deficit noted in hip and knee flexor and extensor muscle groups.  Ankle flexion and extension intact.  Skin: Skin is warm and dry.  Psychiatric: He has a normal mood and affect.    ED Course  Procedures (including critical care time) Labs Review Labs Reviewed - No data to display  Imaging Review No results found.   EKG Interpretation None      MDM   Final diagnoses:  Lumbar strain, initial encounter    No neuro deficit on exam or by history to suggest emergent or surgical presentation.  Also discussed worsened sx that should prompt immediate re-evaluation including distal weakness, bowel/bladder retention/incontinence.  Patient with acute on chronic low back pain with no neurologic deficits on exam today.  He was prescribed Flexeril, a few Percocet and prednisone taper.  He was given referrals for establishing primary care.        Burgess Amor, PA-C 04/16/14 1542

## 2014-04-16 NOTE — ED Provider Notes (Signed)
Medical screening examination/treatment/procedure(s) were performed by non-physician practitioner and as supervising physician I was immediately available for consultation/collaboration.   EKG Interpretation None        Kelsen Celona N Georgianne Gritz, DO 04/16/14 1636 

## 2014-05-18 ENCOUNTER — Emergency Department: Payer: Self-pay

## 2014-05-26 ENCOUNTER — Emergency Department (HOSPITAL_COMMUNITY): Payer: Medicaid Other

## 2014-05-26 ENCOUNTER — Emergency Department (HOSPITAL_COMMUNITY)
Admission: EM | Admit: 2014-05-26 | Discharge: 2014-05-26 | Disposition: A | Discharge status: Home / Self Care | Payer: Medicaid Other | Attending: Emergency Medicine | Admitting: Emergency Medicine

## 2014-05-26 ENCOUNTER — Encounter (HOSPITAL_COMMUNITY): Payer: Self-pay | Admitting: Emergency Medicine

## 2014-05-26 DIAGNOSIS — F17200 Nicotine dependence, unspecified, uncomplicated: Secondary | ICD-10-CM | POA: Insufficient documentation

## 2014-05-26 DIAGNOSIS — S6990XA Unspecified injury of unspecified wrist, hand and finger(s), initial encounter: Secondary | ICD-10-CM | POA: Insufficient documentation

## 2014-05-26 DIAGNOSIS — Z79899 Other long term (current) drug therapy: Secondary | ICD-10-CM | POA: Diagnosis not present

## 2014-05-26 DIAGNOSIS — S6390XA Sprain of unspecified part of unspecified wrist and hand, initial encounter: Secondary | ICD-10-CM | POA: Diagnosis not present

## 2014-05-26 DIAGNOSIS — Y939 Activity, unspecified: Secondary | ICD-10-CM | POA: Diagnosis not present

## 2014-05-26 DIAGNOSIS — W010XXA Fall on same level from slipping, tripping and stumbling without subsequent striking against object, initial encounter: Secondary | ICD-10-CM | POA: Diagnosis not present

## 2014-05-26 DIAGNOSIS — S6391XA Sprain of unspecified part of right wrist and hand, initial encounter: Secondary | ICD-10-CM

## 2014-05-26 DIAGNOSIS — J45909 Unspecified asthma, uncomplicated: Secondary | ICD-10-CM | POA: Diagnosis not present

## 2014-05-26 DIAGNOSIS — Y929 Unspecified place or not applicable: Secondary | ICD-10-CM | POA: Diagnosis not present

## 2014-05-26 MED ORDER — TRAMADOL HCL 50 MG PO TABS: 50.0000 mg | ORAL_TABLET | Freq: Four times a day (QID) | ORAL | Status: DC | PRN

## 2014-05-26 MED ORDER — TRAMADOL HCL 50 MG PO TABS
50.0000 mg | ORAL_TABLET | Freq: Once | ORAL | Status: AC
Start: 2014-05-26 — End: 2014-05-26
  Administered 2014-05-26: 50 mg via ORAL
  Filled 2014-05-26: qty 1

## 2014-05-26 MED ORDER — IBUPROFEN 600 MG PO TABS: 600.0000 mg | ORAL_TABLET | Freq: Three times a day (TID) | ORAL | Status: DC | PRN

## 2014-05-26 MED ORDER — IBUPROFEN 800 MG PO TABS
800.0000 mg | ORAL_TABLET | Freq: Once | ORAL | Status: AC
  Administered 2014-05-26: 800 mg via ORAL
  Filled 2014-05-26: qty 1

## 2014-05-26 NOTE — ED Provider Notes (Signed)
CSN: [id number]     Arrival date & time 05/26/14  1317 History  This chart was scribed for Burgess Amor, PA, working with Donnetta Hutching, MD found by Elon Spanner, ED Scribe. This patient was seen in room APFT21/APFT21 and the patient's care was started at 3:52 PM.  Chief Complaint  Patient presents with  . Hand Injury   The history is provided by the patient. No language interpreter was used.   HPI Comments: David Leach is a 38 y.o.right handed male who presents to the Emergency Department complaining of right hand and wrist injury that occurred 4 hours ago.  Patient reports he slipped on his wet deck and he caught himself with his hand, hyperextending the wrist. He has increased pain across his distal hand with mild soreness at the wrist.  The patient reports associated moderate pain and swelling with a limited range of motion.  Patient reports he took 4x200 mg ibuprofen PTA without relief.  Patient denies icing the affected area.   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    WUJ:WJXB Orthopaedist:none Past Medical History  Diagnosis Date  . Asthma    Past Surgical History  Procedure Laterality Date  . Tonsillectomy     Family History  Problem Relation Age of Onset  . Cancer Mother   . Diabetes Father   . Cancer Other   . Stroke Other   . Diabetes Other    History  Substance Use Topics  . Smoking status: Current Every Day Smoker -- 0.50 packs/day for 20 years    Types: Cigarettes  . Smokeless tobacco: Former Neurosurgeon    Types: Chew  . Alcohol Use: Yes     Comment: occ    Review of Systems  Constitutional: Negative for fever.  Musculoskeletal: Positive for arthralgias and joint swelling. Negative for myalgias.  Neurological: Negative for weakness and numbness.      Allergies  Hydrocodone  Home Medications   Prior to Admission medications   Medication Sig Start Date End Date Taking? Authorizing Provider  albuterol (PROVENTIL HFA;VENTOLIN HFA)  108 (90 BASE) MCG/ACT inhaler Inhale 2 puffs into the lungs every 4 (four) hours as needed for wheezing or shortness of breath.   Yes Historical Provider, MD  Aspirin-Salicylamide-Caffeine (BC HEADACHE) 325-95-16 MG TABS Take 1 packet by mouth daily as needed (for pain).   Yes Historical Provider, MD  ibuprofen (ADVIL,MOTRIN) 600 MG tablet Take 1 tablet (600 mg total) by mouth every 8 (eight) hours as needed. 05/26/14   Burgess Amor, PA-C  traMADol (ULTRAM) 50 MG tablet Take 1 tablet (50 mg total) by mouth every 6 (six) hours as needed. 05/26/14   Burgess Amor, PA-C   BP 139/59  Pulse 99  Temp(Src) 98.2 F (36.8 C) (Oral)  Resp 18  Ht  (1.803 m)  Wt 198 lb (89.812 kg)  BMI 27.63 kg/m2  SpO2 99% Physical Exam  Nursing note and vitals reviewed. Constitutional: He appears well-developed and well-nourished.  HENT:  Head: Atraumatic.  Neck: Normal range of motion.  Cardiovascular:  Pulses equal bilaterally  Musculoskeletal: He exhibits tenderness.  Mild edema and tender to palpation over right 3rd and 4th MCP joints dorsally.  Pain with finger extension which radiates to volar wrist.  <3sec cap refill in finger tips.  No proximal forearm pain and no elbow pain. No point tenderness in wrist, no snuffbox tenderness.  Neurological: He is alert. He has normal strength. He displays normal reflexes. No sensory deficit.  Skin: Skin is warm and dry.  Psychiatric: He has a normal mood and affect.    ED Course  Procedures (including critical care time)  DIAGNOSTIC STUDIES: Oxygen Saturation is 99% on RA, normal by my interpretation.    COORDINATION OF CARE:  3:55 PM Discussed negative imaging studies and treatment plan with patient at bedside.  Advised patient to continue to ice the area.  Will order pain medication and wrist splint.  Patient acknowledges and agrees with plan.    Labs Review Labs Reviewed - No data to display  Imaging Review Dg Hand Complete Right  05/26/2014   CLINICAL  DATA:  Slipped and fell today landing on RIGHT hand, pain and swelling at third MCP joint region  EXAM: RIGHT HAND - COMPLETE 3+ VIEW  COMPARISON:  None  FINDINGS: Osseous mineralization normal.  Joint spaces preserved.  No fracture, dislocation, or bone destruction.  IMPRESSION: No acute osseous abnormalities.   Electronically Signed   By: Ulyses Southward M.D.   On: 05/26/2014 15:05     EKG Interpretation None      MDM   Final diagnoses:  Hand sprain, right, initial encounter    Patients labs and/or radiological studies were viewed and considered during the medical decision making and disposition process. Jones dressing applied by RN,  Encouraged RICE, prescribed ibuprofen, ultram,  Referral to ortho for f/u if sx persist or are not improving beyond the next week.  I personally performed the services described in this documentation, which was scribed in my presence. The recorded information has been reviewed and is accurate.   Burgess Amor, PA-C 05/27/14 [phone]

## 2014-05-26 NOTE — Discharge Instructions (Signed)
Sprain A sprain is a tear in one of the strong, fibrous tissues that connect your bones (ligaments). The severity of the sprain depends on how much of the ligament is torn. The tear can be either partial or complete. CAUSES  Often, sprains are a result of a fall or an injury. The force of the impact causes the fibers of your ligament to stretch beyond their normal length. This excess tension causes the fibers of your ligament to tear. SYMPTOMS  You may have some loss of motion or increased pain within your normal range of motion. Other symptoms include:  Bruising.  Tenderness.  Swelling. DIAGNOSIS  In order to diagnose a sprain, your caregiver will physically examine you to determine how torn the ligament is. Your caregiver may also suggest an X-ray exam to make sure no bones are broken. TREATMENT  If your ligament is only partially torn, treatment usually involves keeping the injured area in a fixed position (immobilization) for a short period. To do this, your caregiver will apply a bandage, cast, or splint to keep the area from moving until it heals. For a partially torn ligament, the healing process usually takes 2 to 3 weeks. If your ligament is completely torn, you may need surgery to reconnect the ligament to the bone or to reconstruct the ligament. After surgery, a cast or splint may be applied and will need to stay on for 4 to 6 weeks while your ligament heals. HOME CARE INSTRUCTIONS  Keep the injured area elevated to decrease swelling.  To ease pain and swelling, apply ice to your joint twice a day, for 2 to 3 days.  Put ice in a plastic bag.  Place a towel between your skin and the bag.  Leave the ice on for 15 minutes.  Only take over-the-counter or prescription medicine for pain as directed by your caregiver.  Do not leave the injured area unprotected until pain and stiffness go away (usually 3 to 4 weeks).  Do not allow your cast or splint to get wet. Cover your cast or  splint with a plastic bag when you shower or bathe. Do not swim.  Your caregiver may suggest exercises for you to do during your recovery to prevent or limit permanent stiffness. SEEK IMMEDIATE MEDICAL CARE IF:  Your cast or splint becomes damaged.  Your pain becomes worse. MAKE SURE YOU:  Understand these instructions.  Will watch your condition.  Will get help right away if you are not doing well or get worse. Document Released: 08/13/2000 Document Revised: 11/08/2011 Document Reviewed: 08/28/2011 [facility] Patient Information 2015 [place], [place]. This information is not intended to replace advice given to you by your health care provider. Make sure you discuss any questions you have with your health care provider.   Wear the Ace wrap for comfort until your symptoms are improving.  Elevation and ice will also help with pain and swelling.  Use caution with the medications prescribed as this can make you drowsy.  I also recommend ibuprofen which has been prescribed.  Call Dr. Romeo Apple for further evaluation if your symptoms persist beyond the next week.

## 2014-05-26 NOTE — ED Notes (Signed)
Onset 2 hours ago, pt fell on desk outside, sharp pain in right hand and wrist, swelling noted

## 2014-05-28 NOTE — ED Provider Notes (Signed)
Medical screening examination/treatment/procedure(s) were performed by non-physician practitioner and as supervising physician I was immediately available for consultation/collaboration.   EKG Interpretation None       Adylynn Hertenstein, MD 05/28/14 2301 

## 2014-06-04 ENCOUNTER — Encounter (HOSPITAL_COMMUNITY): Payer: Self-pay | Admitting: Emergency Medicine

## 2014-06-04 ENCOUNTER — Emergency Department (HOSPITAL_COMMUNITY)
Admission: EM | Admit: 2014-06-04 | Discharge: 2014-06-04 | Disposition: A | Discharge status: Home / Self Care | Payer: Medicaid Other | Attending: Emergency Medicine | Admitting: Emergency Medicine

## 2014-06-04 DIAGNOSIS — G43009 Migraine without aura, not intractable, without status migrainosus: Secondary | ICD-10-CM | POA: Insufficient documentation

## 2014-06-04 DIAGNOSIS — Z720 Tobacco use: Secondary | ICD-10-CM | POA: Insufficient documentation

## 2014-06-04 DIAGNOSIS — J45909 Unspecified asthma, uncomplicated: Secondary | ICD-10-CM | POA: Diagnosis not present

## 2014-06-04 DIAGNOSIS — Z79899 Other long term (current) drug therapy: Secondary | ICD-10-CM | POA: Insufficient documentation

## 2014-06-04 DIAGNOSIS — K088 Other specified disorders of teeth and supporting structures: Secondary | ICD-10-CM | POA: Insufficient documentation

## 2014-06-04 DIAGNOSIS — S0993XA Unspecified injury of face, initial encounter: Secondary | ICD-10-CM

## 2014-06-04 DIAGNOSIS — R110 Nausea: Secondary | ICD-10-CM | POA: Insufficient documentation

## 2014-06-04 DIAGNOSIS — H53149 Visual discomfort, unspecified: Secondary | ICD-10-CM | POA: Diagnosis not present

## 2014-06-04 MED ORDER — TRAMADOL HCL 50 MG PO TABS: 50.0000 mg | ORAL_TABLET | Freq: Four times a day (QID) | ORAL | Status: DC | PRN

## 2014-06-04 MED ORDER — KETOROLAC TROMETHAMINE 60 MG/2ML IM SOLN
60.0000 mg | Freq: Once | INTRAMUSCULAR | Status: AC
  Administered 2014-06-04: 60 mg via INTRAMUSCULAR
  Filled 2014-06-04: qty 2

## 2014-06-04 MED ORDER — TRAMADOL HCL 50 MG PO TABS
50.0000 mg | ORAL_TABLET | Freq: Once | ORAL | Status: AC
  Administered 2014-06-04: 50 mg via ORAL
  Filled 2014-06-04: qty 1

## 2014-06-04 MED ORDER — DIPHENHYDRAMINE HCL 50 MG/ML IJ SOLN
50.0000 mg | Freq: Once | INTRAMUSCULAR | Status: DC
  Filled 2014-06-04: qty 1

## 2014-06-04 MED ORDER — METOCLOPRAMIDE HCL 5 MG/ML IJ SOLN
10.0000 mg | Freq: Once | INTRAMUSCULAR | Status: AC
  Administered 2014-06-04: 10 mg via INTRAMUSCULAR
  Filled 2014-06-04: qty 2

## 2014-06-04 MED ORDER — DIPHENHYDRAMINE HCL 50 MG/ML IJ SOLN
50.0000 mg | Freq: Once | INTRAMUSCULAR | Status: AC
  Administered 2014-06-04: 50 mg via INTRAMUSCULAR

## 2014-06-04 MED ORDER — BUTALBITAL-APAP-CAFFEINE 50-325-40 MG PO TABS: 1.0000 | ORAL_TABLET | Freq: Four times a day (QID) | ORAL | Status: DC | PRN

## 2014-06-04 NOTE — Discharge Instructions (Signed)
Dental Injury Your exam shows that you have injured your teeth. The treatment of broken teeth and other dental injuries depends on how badly they are hurt. All dental injuries should be checked as soon as possible by a dentist if there are:  Loose teeth which may need to be wired or bonded with a plastic device to hold them in place.  Broken teeth with exposed tooth pulp which may cause a serious infection.  Painful teeth especially when you bite or chew.  Sharp tooth edges that cut your tongue or lips. Sometimes, antibiotics or pain medicine are prescribed to prevent infection and control pain. Eat a soft or liquid diet and rinse your mouth out after meals with warm water. You should see a dentist or return here at once if you have increased swelling, increased pain or uncontrolled bleeding from the site of your injury. SEEK MEDICAL CARE IF:   You have increased pain not controlled with medicines.  You have swelling around your tooth, in your face or neck.  You have bleeding which starts, continues, or gets worse.  You have a fever. Document Released: 08/16/2005 Document Revised: 11/08/2011 Document Reviewed: 08/15/2009 [facility] Patient Information 2015 [place]. This information is not intended to replace advice given to you by your health care provider. Make sure you discuss any questions you have with your health care provider.  Migraine Headache A migraine headache is an intense, throbbing pain on one or both sides of your head. A migraine can last for 30 minutes to several hours. CAUSES  The exact cause of a migraine headache is not always known. However, a migraine may be caused when nerves in the brain become irritated and release chemicals that cause inflammation. This causes pain. Certain things may also trigger migraines, such as:  Alcohol.  Smoking.  Stress.  Menstruation.  Aged cheeses.  Foods or drinks that contain nitrates, glutamate, aspartame, or  tyramine.  Lack of sleep.  Chocolate.  Caffeine.  Hunger.  Physical exertion.  Fatigue.  Medicines used to treat chest pain (nitroglycerine), birth control pills, estrogen, and some blood pressure medicines. SIGNS AND SYMPTOMS  Pain on one or both sides of your head.  Pulsating or throbbing pain.  Severe pain that prevents daily activities.  Pain that is aggravated by any physical activity.  Nausea, vomiting, or both.  Dizziness.  Pain with exposure to bright lights, loud noises, or activity.  General sensitivity to bright lights, loud noises, or smells. Before you get a migraine, you may get warning signs that a migraine is coming (aura). An aura may include:  Seeing flashing lights.  Seeing bright spots, halos, or zigzag lines.  Having tunnel vision or blurred vision.  Having feelings of numbness or tingling.  Having trouble talking.  Having muscle weakness. DIAGNOSIS  A migraine headache is often diagnosed based on:  Symptoms.  Physical exam.  A CT scan or MRI of your head. These imaging tests cannot diagnose migraines, but they can help rule out other causes of headaches. TREATMENT Medicines may be given for pain and nausea. Medicines can also be given to help prevent recurrent migraines.  HOME CARE INSTRUCTIONS  Only take over-the-counter or prescription medicines for pain or discomfort as directed by your health care provider. The use of long-term narcotics is not recommended.  Lie down in a dark, quiet room when you have a migraine.  Keep a journal to find out what may trigger your migraine headaches. For example, write down:  What you  eat and drink.  How much sleep you get.  Any change to your diet or medicines.  Limit alcohol consumption.  Quit smoking if you smoke.  Get 7-9 hours of sleep, or as recommended by your health care provider.  Limit stress.  Keep lights dim if bright lights bother you and make your migraines  worse. SEEK IMMEDIATE MEDICAL CARE IF:   Your migraine becomes severe.  You have a fever.  You have a stiff neck.  You have vision loss.  You have muscular weakness or loss of muscle control.  You start losing your balance or have trouble walking.  You feel faint or pass out.  You have severe symptoms that are different from your first symptoms. MAKE SURE YOU:   Understand these instructions.  Will watch your condition.  Will get help right away if you are not doing well or get worse. Document Released: 08/16/2005 Document Revised: 12/31/2013 Document Reviewed: 04/23/2013 [facility] Patient Information 2015 [place], [place]. This information is not intended to replace advice given to you by your health care provider. Make sure you discuss any questions you have with your health care provider.

## 2014-06-04 NOTE — ED Notes (Signed)
Left bottom tooth pain per pt. Causing me to have a bad headache per pt.

## 2014-06-04 NOTE — ED Notes (Signed)
Patient given discharge instruction, verbalized understand. Patient ambulatory out of the department.  

## 2014-06-06 NOTE — ED Provider Notes (Signed)
CSN: [id number]     Arrival date & time 06/04/14  2151 History   First MD Initiated Contact with Patient 06/04/14 2157     Chief Complaint  Patient presents with  . Dental Pain     (Consider location/radiation/quality/duration/timing/severity/associated sxs/prior Treatment) The history is provided by the patient.   David Leach is a 38 y.o. male presenting with a one day history of dental pain.   The patient has a history of  decay in the tooth involved which has recently started to cause increased  Pain again.  He reports has an appointment with a dentist in 3 days.  There has been no fevers, chills, nausea or vomiting, also no complaint of difficulty swallowing, although chewing makes pain worse.  The patient has tried bc powders without relief of symptoms.  His has a history of migraine headache and this toothache has triggered a headache this afternoon. He endorses nausea without emesis, photo and phonophobia.  The patient has left sided frontal pain.  There has been no fevers, chills, syncope, confusion or localized weakness.         Past Medical History  Diagnosis Date  . Asthma    Past Surgical History  Procedure Laterality Date  . Tonsillectomy     Family History  Problem Relation Age of Onset  . Cancer Mother   . Diabetes Father   . Cancer Other   . Stroke Other   . Diabetes Other    History  Substance Use Topics  . Smoking status: Current Every Day Smoker -- 0.50 packs/day for 20 years    Types: Cigarettes  . Smokeless tobacco: Former NeurosurgeonUser    Types: Chew  . Alcohol Use: Yes     Comment: occ    Review of Systems  Constitutional: Negative for fever and chills.  HENT: Positive for dental problem. Negative for congestion and sore throat.   Eyes: Positive for photophobia.  Respiratory: Negative for chest tightness and shortness of breath.   Cardiovascular: Negative for chest pain.  Gastrointestinal: Positive for nausea. Negative for vomiting and abdominal  pain.  Genitourinary: Negative.   Musculoskeletal: Negative for arthralgias, joint swelling and neck pain.  Skin: Negative.  Negative for rash and wound.  Neurological: Positive for headaches. Negative for dizziness, weakness, light-headedness and numbness.  Psychiatric/Behavioral: Negative.       Allergies  Hydrocodone  Home Medications   Prior to Admission medications   Medication Sig Start Date End Date Taking? Authorizing Provider  albuterol (PROVENTIL HFA;VENTOLIN HFA) 108 (90 BASE) MCG/ACT inhaler Inhale 2 puffs into the lungs every 4 (four) hours as needed for wheezing or shortness of breath.    Historical Provider, MD  Aspirin-Salicylamide-Caffeine (BC HEADACHE) 325-95-16 MG TABS Take 1 packet by mouth daily as needed (for pain).    Historical Provider, MD  ibuprofen (ADVIL,MOTRIN) 600 MG tablet Take 1 tablet (600 mg total) by mouth every 8 (eight) hours as needed. 05/26/14   [name] Debi Cousin, PA-C  traMADol (ULTRAM) 50 MG tablet Take 1 tablet (50 mg total) by mouth every 6 (six) hours as needed. 05/26/14   [name] Haydyn Girvan, PA-C  traMADol (ULTRAM) 50 MG tablet Take 1 tablet (50 mg total) by mouth every 6 (six) hours as needed. 06/04/14   [name] Martena Emanuele, PA-C   BP 131/84  Pulse 96  Temp(Src) 97.6 F (36.4 C) (Oral)  Resp 18  Ht 5\' 11"  (1.803 m)  Wt 195 lb (88.451 kg)  BMI 27.21 kg/m2  SpO2 100% Physical Exam  Nursing note and vitals reviewed. Constitutional: He is oriented to person, place, and time. He appears well-developed and well-nourished.  HENT:  Head: Normocephalic and atraumatic.  Mouth/Throat: Oropharynx is clear and moist. No trismus in the jaw. Dental caries present. No dental abscesses or uvula swelling.  Generalized poor dentition.  Eyes: EOM are normal. Pupils are equal, round, and reactive to light.  Neck: Normal range of motion. Neck supple.  Cardiovascular: Normal rate and normal heart sounds.   Pulmonary/Chest: Effort normal.  Abdominal: Soft. There is no  tenderness.  Musculoskeletal: Normal range of motion.  Lymphadenopathy:    He has no cervical adenopathy.  Neurological: He is alert and oriented to person, place, and time. He has normal strength. No sensory deficit. Gait normal. GCS eye subscore is 4. GCS verbal subscore is 5. GCS motor subscore is 6.  Normal heel-shin, normal rapid alternating movements. Cranial nerves III-XII intact.  No pronator drift.  Skin: Skin is warm and dry. No rash noted.  Psychiatric: He has a normal mood and affect. His speech is normal and behavior is normal. Thought content normal. Cognition and memory are normal.    ED Course  Procedures (including critical care time) Labs Review Labs Reviewed - No data to display  Imaging Review No results found.   EKG Interpretation None      MDM   Final diagnoses:  Migraine without aura and without status migrainosus, not intractable  Dental injury, initial encounter    Pt was given reglan, toradol and benadryl injections with complete resolution of migraine, although still endorsed dental pain.  Given tramadol and script for same. Advised f/u with dentistry as planned.  Awake, alert at time of dispo.  The patient appears reasonably screened and/or stabilized for discharge and I doubt any other medical condition or other [facility] requiring further screening, evaluation, or treatment in the ED at this time prior to discharge.     Burgess Amor, PA-C 06/06/14 [id number]

## 2014-06-06 NOTE — ED Provider Notes (Signed)
Medical screening examination/treatment/procedure(s) were performed by non-physician practitioner and as supervising physician I was immediately available for consultation/collaboration.   EKG Interpretation None        Eulalia Ellerman L Latash Nouri, MD 06/06/14 1538 

## 2014-06-15 ENCOUNTER — Encounter (HOSPITAL_COMMUNITY): Payer: Self-pay | Admitting: Emergency Medicine

## 2014-06-15 ENCOUNTER — Emergency Department (HOSPITAL_COMMUNITY)
Admission: EM | Admit: 2014-06-15 | Discharge: 2014-06-15 | Disposition: A | Discharge status: Home / Self Care | Payer: Medicaid Other | Attending: Emergency Medicine | Admitting: Emergency Medicine

## 2014-06-15 DIAGNOSIS — T25221A Burn of second degree of right foot, initial encounter: Secondary | ICD-10-CM | POA: Diagnosis not present

## 2014-06-15 DIAGNOSIS — J45909 Unspecified asthma, uncomplicated: Secondary | ICD-10-CM | POA: Diagnosis not present

## 2014-06-15 DIAGNOSIS — Y9289 Other specified places as the place of occurrence of the external cause: Secondary | ICD-10-CM | POA: Diagnosis not present

## 2014-06-15 DIAGNOSIS — T25429A Corrosion of unspecified degree of unspecified foot, initial encounter: Secondary | ICD-10-CM

## 2014-06-15 DIAGNOSIS — Z720 Tobacco use: Secondary | ICD-10-CM | POA: Diagnosis not present

## 2014-06-15 DIAGNOSIS — R000 Tachycardia, unspecified: Secondary | ICD-10-CM | POA: Diagnosis not present

## 2014-06-15 DIAGNOSIS — T23291A Burn of second degree of multiple sites of right wrist and hand, initial encounter: Secondary | ICD-10-CM | POA: Diagnosis not present

## 2014-06-15 DIAGNOSIS — Z79899 Other long term (current) drug therapy: Secondary | ICD-10-CM | POA: Insufficient documentation

## 2014-06-15 DIAGNOSIS — T543X1A Toxic effect of corrosive alkalis and alkali-like substances, accidental (unintentional), initial encounter: Secondary | ICD-10-CM | POA: Diagnosis not present

## 2014-06-15 DIAGNOSIS — T25222A Burn of second degree of left foot, initial encounter: Secondary | ICD-10-CM | POA: Insufficient documentation

## 2014-06-15 DIAGNOSIS — Y93E5 Activity, floor mopping and cleaning: Secondary | ICD-10-CM | POA: Insufficient documentation

## 2014-06-15 DIAGNOSIS — R21 Rash and other nonspecific skin eruption: Secondary | ICD-10-CM | POA: Diagnosis present

## 2014-06-15 MED ORDER — OXYCODONE-ACETAMINOPHEN 5-325 MG PO TABS
1.0000 | ORAL_TABLET | Freq: Once | ORAL | Status: AC
  Administered 2014-06-15: 1 via ORAL
  Filled 2014-06-15: qty 1

## 2014-06-15 MED ORDER — SILVER SULFADIAZINE 1 % EX CREA: 1.0000 "application " | TOPICAL_CREAM | Freq: Every day | CUTANEOUS | Status: DC

## 2014-06-15 MED ORDER — OXYCODONE-ACETAMINOPHEN 5-325 MG PO TABS: 1.0000 | ORAL_TABLET | ORAL | Status: DC | PRN

## 2014-06-15 MED ORDER — SILVER SULFADIAZINE 1 % EX CREA
TOPICAL_CREAM | Freq: Once | CUTANEOUS | Status: AC
  Administered 2014-06-15: 1 via TOPICAL
  Filled 2014-06-15: qty 50
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
## 2014-06-15 NOTE — Discharge Instructions (Signed)
Change the dressing every 12 hours and apply the antibiotic cream.

## 2014-06-16 NOTE — ED Provider Notes (Signed)
Medical screening examination/treatment/procedure(s) were performed by non-physician practitioner and as supervising physician I was immediately available for consultation/collaboration.   Gilda Creasehristopher J. Raul Winterhalter, MD 06/16/14 [phone]

## 2014-06-30 ENCOUNTER — Emergency Department (HOSPITAL_COMMUNITY)
Admission: EM | Admit: 2014-06-30 | Discharge: 2014-06-30 | Disposition: A | Discharge status: Home / Self Care | Payer: Medicaid Other | Attending: Emergency Medicine | Admitting: Emergency Medicine

## 2014-06-30 ENCOUNTER — Encounter (HOSPITAL_COMMUNITY): Payer: Self-pay | Admitting: Cardiology

## 2014-06-30 DIAGNOSIS — M79673 Pain in unspecified foot: Secondary | ICD-10-CM

## 2014-06-30 DIAGNOSIS — Z720 Tobacco use: Secondary | ICD-10-CM | POA: Diagnosis not present

## 2014-06-30 DIAGNOSIS — M79672 Pain in left foot: Secondary | ICD-10-CM | POA: Diagnosis not present

## 2014-06-30 DIAGNOSIS — M79671 Pain in right foot: Secondary | ICD-10-CM | POA: Insufficient documentation

## 2014-06-30 DIAGNOSIS — G8911 Acute pain due to trauma: Secondary | ICD-10-CM | POA: Insufficient documentation

## 2014-06-30 DIAGNOSIS — Z79899 Other long term (current) drug therapy: Secondary | ICD-10-CM | POA: Diagnosis not present

## 2014-06-30 DIAGNOSIS — J45909 Unspecified asthma, uncomplicated: Secondary | ICD-10-CM | POA: Diagnosis not present

## 2014-06-30 MED ORDER — OXYCODONE-ACETAMINOPHEN 5-325 MG PO TABS: 1.0000 | ORAL_TABLET | ORAL | Status: DC | PRN
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
## 2014-07-07 ENCOUNTER — Emergency Department: Payer: Self-pay | Admitting: Emergency Medicine

## 2014-07-21 ENCOUNTER — Encounter (HOSPITAL_COMMUNITY): Payer: Self-pay | Admitting: *Deleted

## 2014-07-21 ENCOUNTER — Emergency Department (HOSPITAL_COMMUNITY)
Admission: EM | Admit: 2014-07-21 | Discharge: 2014-07-21 | Disposition: A | Discharge status: Home / Self Care | Payer: Medicaid Other | Attending: Emergency Medicine | Admitting: Emergency Medicine

## 2014-07-21 ENCOUNTER — Emergency Department (HOSPITAL_COMMUNITY): Payer: Medicaid Other

## 2014-07-21 DIAGNOSIS — Z720 Tobacco use: Secondary | ICD-10-CM | POA: Diagnosis not present

## 2014-07-21 DIAGNOSIS — Z79899 Other long term (current) drug therapy: Secondary | ICD-10-CM | POA: Insufficient documentation

## 2014-07-21 DIAGNOSIS — J45909 Unspecified asthma, uncomplicated: Secondary | ICD-10-CM | POA: Insufficient documentation

## 2014-07-21 DIAGNOSIS — Y9389 Activity, other specified: Secondary | ICD-10-CM | POA: Insufficient documentation

## 2014-07-21 DIAGNOSIS — Y9289 Other specified places as the place of occurrence of the external cause: Secondary | ICD-10-CM | POA: Insufficient documentation

## 2014-07-21 DIAGNOSIS — X58XXXA Exposure to other specified factors, initial encounter: Secondary | ICD-10-CM | POA: Insufficient documentation

## 2014-07-21 DIAGNOSIS — T1490XA Injury, unspecified, initial encounter: Secondary | ICD-10-CM

## 2014-07-21 DIAGNOSIS — M549 Dorsalgia, unspecified: Secondary | ICD-10-CM

## 2014-07-21 DIAGNOSIS — Y990 Civilian activity done for income or pay: Secondary | ICD-10-CM | POA: Diagnosis not present

## 2014-07-21 DIAGNOSIS — S29002A Unspecified injury of muscle and tendon of back wall of thorax, initial encounter: Secondary | ICD-10-CM | POA: Insufficient documentation

## 2014-07-21 DIAGNOSIS — M25511 Pain in right shoulder: Secondary | ICD-10-CM

## 2014-07-21 DIAGNOSIS — S4991XA Unspecified injury of right shoulder and upper arm, initial encounter: Secondary | ICD-10-CM | POA: Insufficient documentation

## 2014-07-21 MED ORDER — OXYCODONE-ACETAMINOPHEN 5-325 MG PO TABS: 1.0000 | ORAL_TABLET | Freq: Four times a day (QID) | ORAL | Status: DC | PRN

## 2014-07-21 MED ORDER — MELOXICAM 7.5 MG PO TABS: 7.5000 mg | ORAL_TABLET | Freq: Every day | ORAL | Status: DC

## 2014-07-21 MED ORDER — METHOCARBAMOL 500 MG PO TABS: 500.0000 mg | ORAL_TABLET | Freq: Two times a day (BID) | ORAL | Status: DC

## 2014-07-21 NOTE — ED Provider Notes (Signed)
CSN: [id number]     Arrival date & time 07/21/14  1643 History  This chart was scribed for Junius FinnerErin O'Malley, PA-C, working with Elwin MochaBlair Walden, MD by Chestine SporeSoijett Blue, ED Scribe. The patient was seen in room TR06C/TR06C at 5:54 PM.    Chief Complaint  Patient presents with  . Shoulder Injury    The history is provided by the patient. No language interpreter was used.   HPI Comments: David Leach is a 38 y.o. male who presents to the Emergency Department complaining of right shoulder injury onset 2 days ago. Pt was lifting sheet rock at work when the pain occurred. A co-worker dropped the sheet rock and the pt tried to catch it and that is when the pain came. He heard a pop when the incident occurred. He reports that the pain is sharp and throbbing and he cant get comfortable. He has pain with movement, 8/10 at worst. He states that [facility] and Ibuprofen with no relief for his symptoms. He denies any other associated symptoms. Denies any shoulder injury for it. Pt has an appointment in [place] with an orthopedist on 11/27.   Past Medical History  Diagnosis Date  . Asthma    Past Surgical History  Procedure Laterality Date  . Tonsillectomy     Family History  Problem Relation Age of Onset  . Cancer Mother   . Diabetes Father   . Cancer Other   . Stroke Other   . Diabetes Other    History  Substance Use Topics  . Smoking status: Current Every Day Smoker -- 0.50 packs/day for 20 years    Types: Cigarettes  . Smokeless tobacco: Former NeurosurgeonUser    Types: Chew  . Alcohol Use: Yes     Comment: occ    Review of Systems  Musculoskeletal: Positive for arthralgias.      Allergies  Hydrocodone  Home Medications   Prior to Admission medications   Medication Sig Start Date End Date Taking? Authorizing Provider  albuterol (PROVENTIL HFA;VENTOLIN HFA) 108 (90 BASE) MCG/ACT inhaler Inhale 2 puffs into the lungs every 4 (four) hours as needed for wheezing or shortness of breath.     Historical Provider, MD  ibuprofen (ADVIL,MOTRIN) 600 MG tablet Take 1 tablet (600 mg total) by mouth every 8 (eight) hours as needed. 05/26/14   [name] Idol, PA-C  meloxicam (MOBIC) 7.5 MG tablet Take 1 tablet (7.5 mg total) by mouth daily. 07/21/14   Junius FinnerErin O'Malley, PA-C  methocarbamol (ROBAXIN) 500 MG tablet Take 1 tablet (500 mg total) by mouth 2 (two) times daily. 07/21/14   Junius FinnerErin O'Malley, PA-C  oxyCODONE-acetaminophen (ROXICET) 5-325 MG per tablet Take 1 tablet by mouth every 6 (six) hours as needed for severe pain. 07/21/14   Junius FinnerErin O'Malley, PA-C  silver sulfADIAZINE (SILVADENE) 1 % cream Apply 1 application topically daily. 06/15/14   Hope Orlene OchM Neese, NP  traMADol (ULTRAM) 50 MG tablet Take 1 tablet (50 mg total) by mouth every 6 (six) hours as needed. 05/26/14   [name] Idol, PA-C   BP 125/81 mmHg  Pulse 110  Temp(Src) 98.4 F (36.9 C)  Resp 18  Ht 5\' 11"  (1.803 m)  Wt 195 lb (88.451 kg)  BMI 27.21 kg/m2  SpO2 98%  Physical Exam  Constitutional: He is oriented to person, place, and time. He appears well-developed and well-nourished. No distress.  HENT:  Head: Normocephalic and atraumatic.  Eyes: EOM are normal.  Neck: Neck supple. No tracheal deviation present.  Cardiovascular: Normal rate.   Pulmonary/Chest: Effort normal. No respiratory distress.  Musculoskeletal:       Right shoulder: He exhibits decreased range of motion and tenderness. He exhibits no bony tenderness and no deformity.  Limited ROM to 95 degrees of abduction due to pain. Tender over the right upper trapezius muscle and the right cervical spinal muscle but no midline spinal tenderness.    Neurological: He is alert and oriented to person, place, and time.  Skin: Skin is warm and dry.  Psychiatric: He has a normal mood and affect. His behavior is normal.  Nursing note and vitals reviewed.   ED Course  Procedures (including critical care time) DIAGNOSTIC STUDIES: Oxygen Saturation is 98% on room air, normal by my  interpretation.    COORDINATION OF CARE: 5:59 PM-Discussed treatment plan which includes pain medication, F/U with orthopedist, and muscle relaxer with pt at bedside and pt agreed to plan.   Labs Review Labs Reviewed - No data to display  Imaging Review Dg Shoulder Right  07/21/2014   CLINICAL DATA:  Falling issue rock injured the patient' s right shoulder. Posterior shoulder pain.  EXAM: RIGHT SHOULDER - 2+ VIEW  COMPARISON:  08/07/2012  FINDINGS: Internal rotated view appears normal and similar to prior exam.  Transscapular view demonstrates a type 2 acromion. No shoulder dislocation.  Axillary view likewise demonstrates no dislocation. AC joint alignment normal.  IMPRESSION: 1. No acute bony findings or dislocation radiographically apparent.   Electronically Signed   By: Herbie BaltimoreWalt  Liebkemann M.D.   On: 07/21/2014 17:51     EKG Interpretation None      MDM   Final diagnoses:  Right shoulder pain  Upper back pain on right side    Pt c/o right upper back and right shoulder pain due to work injury. Limited ROM right shoulder w/o bony tenderness to shoulder. Right arm is neurovascularly in tact.  Plain films negative. Encouraged to f/u with orthopedics, Dr. Roda ShuttersXu, or previously scheduled appointment on Friday [place].  Pt verbalized understanding and agreement with tx plan.    I personally performed the services described in this documentation, which was scribed in my presence. The recorded information has been reviewed and is accurate.    Junius Finnerrin O'Malley, PA-C 07/21/14 1848  Elwin MochaBlair Walden, MD 07/21/14 2258

## 2014-07-21 NOTE — ED Notes (Signed)
Declined W/C at D/C and was escorted to lobby by RN. 

## 2014-07-21 NOTE — Discharge Instructions (Signed)
Acromioclavicular Injuries °The acromioclavicular (AC) joint is the joint in the shoulder. There are many bands of tissue (ligaments) that surround the AC bones and joints. These bands of tissue can tear, which can lead to sprains and separations. The bones of the AC joint can also break (fracture).  °HOME CARE  °· Put ice on the injured area. °¨ Put ice in a plastic bag. °¨ Place a towel between your skin and the bag. °¨ Leave the ice on for 15-20 minutes, 03-04 times a day. °· Wear your sling as told by your doctor. Remove the sling before showering and bathing. Keep the shoulder in the same place as when the sling is on. Do not lift the arm. °· Gently tighten your figure-eight splint (if applied) every day. Tighten it enough to keep the shoulders held back. There should be room to place your finger between your body and the strap. Loosen the splint right away if you lose feeling (numbness) or have tingling in your hands. °· Only take medicine as told by your doctor. °· Keep all follow-up visits with your doctor. °GET HELP RIGHT AWAY IF:  °· Your medicine does not help your pain. °· You have more puffiness (swelling) or your bruising gets worse rather than better. °· You were unable to follow up as told by your doctor. °· You have tingling or lose even more feeling in your arm, forearm, or hand. °· Your arm is cold or pale. °· You have more pain in the hand, forearm, or fingers. °MAKE SURE YOU:  °· Understand these instructions. °· Will watch your condition. °· Will get help right away if you are not doing well or get worse. °Document Released: 02/03/2010 Document Revised: 11/08/2011 Document Reviewed: 02/03/2010 °ExitCare® Patient Information ©2015 [employer], LLC. This information is not intended to replace advice given to you by your health care provider. Make sure you discuss any questions you have with your health care provider. ° °

## 2014-07-21 NOTE — ED Notes (Signed)
The pt is c/o pain in his rt shoulder when he lifted a piece of sheet rock at work Friday.  He has had pain since then especially with movement

## 2014-07-27 ENCOUNTER — Emergency Department (HOSPITAL_COMMUNITY)
Admission: EM | Admit: 2014-07-27 | Discharge: 2014-07-27 | Disposition: A | Discharge status: Home / Self Care | Payer: Medicaid Other | Attending: Emergency Medicine | Admitting: Emergency Medicine

## 2014-07-27 ENCOUNTER — Encounter (HOSPITAL_COMMUNITY): Payer: Self-pay | Admitting: Emergency Medicine

## 2014-07-27 ENCOUNTER — Emergency Department (HOSPITAL_COMMUNITY): Payer: Medicaid Other

## 2014-07-27 DIAGNOSIS — W51XXXA Accidental striking against or bumped into by another person, initial encounter: Secondary | ICD-10-CM | POA: Insufficient documentation

## 2014-07-27 DIAGNOSIS — S8991XA Unspecified injury of right lower leg, initial encounter: Secondary | ICD-10-CM | POA: Insufficient documentation

## 2014-07-27 DIAGNOSIS — M7051 Other bursitis of knee, right knee: Secondary | ICD-10-CM | POA: Insufficient documentation

## 2014-07-27 DIAGNOSIS — Z792 Long term (current) use of antibiotics: Secondary | ICD-10-CM | POA: Insufficient documentation

## 2014-07-27 DIAGNOSIS — J45909 Unspecified asthma, uncomplicated: Secondary | ICD-10-CM | POA: Diagnosis not present

## 2014-07-27 DIAGNOSIS — Z791 Long term (current) use of non-steroidal anti-inflammatories (NSAID): Secondary | ICD-10-CM | POA: Diagnosis not present

## 2014-07-27 DIAGNOSIS — M25511 Pain in right shoulder: Secondary | ICD-10-CM | POA: Diagnosis not present

## 2014-07-27 DIAGNOSIS — Z79899 Other long term (current) drug therapy: Secondary | ICD-10-CM | POA: Diagnosis not present

## 2014-07-27 DIAGNOSIS — Z720 Tobacco use: Secondary | ICD-10-CM | POA: Diagnosis not present

## 2014-07-27 DIAGNOSIS — Y9289 Other specified places as the place of occurrence of the external cause: Secondary | ICD-10-CM | POA: Diagnosis not present

## 2014-07-27 DIAGNOSIS — Y9389 Activity, other specified: Secondary | ICD-10-CM | POA: Insufficient documentation

## 2014-07-27 DIAGNOSIS — Y990 Civilian activity done for income or pay: Secondary | ICD-10-CM | POA: Diagnosis not present

## 2014-07-27 MED ORDER — NAPROXEN 500 MG PO TABS
500.0000 mg | ORAL_TABLET | Freq: Two times a day (BID) | ORAL | Status: DC
Start: 2014-07-27 — End: 2015-03-04

## 2014-07-27 NOTE — Discharge Instructions (Signed)
Continue to take your medication for pain that you got on 07/21/14 and follow up with the orthopedic doctor.

## 2014-07-27 NOTE — ED Notes (Signed)
Knee injury yesterday at work, rates pain 8.  Shoulder pain has been for last 2 weeks, rates pain 8.  Self employed.   Took OTC meds with no relief.
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
## 2014-09-25 ENCOUNTER — Emergency Department: Payer: Self-pay | Admitting: Emergency Medicine

## 2014-10-01 ENCOUNTER — Emergency Department: Payer: Self-pay | Admitting: Internal Medicine

## 2014-11-10 ENCOUNTER — Emergency Department (HOSPITAL_COMMUNITY): Payer: Medicaid Other

## 2014-11-10 ENCOUNTER — Emergency Department (HOSPITAL_COMMUNITY)
Admission: EM | Admit: 2014-11-10 | Discharge: 2014-11-10 | Disposition: A | Discharge status: Home / Self Care | Payer: Medicaid Other | Attending: Emergency Medicine | Admitting: Emergency Medicine

## 2014-11-10 ENCOUNTER — Encounter (HOSPITAL_COMMUNITY): Payer: Self-pay | Admitting: Emergency Medicine

## 2014-11-10 DIAGNOSIS — R630 Anorexia: Secondary | ICD-10-CM | POA: Insufficient documentation

## 2014-11-10 DIAGNOSIS — Z791 Long term (current) use of non-steroidal anti-inflammatories (NSAID): Secondary | ICD-10-CM | POA: Diagnosis not present

## 2014-11-10 DIAGNOSIS — Z7952 Long term (current) use of systemic steroids: Secondary | ICD-10-CM | POA: Insufficient documentation

## 2014-11-10 DIAGNOSIS — Z79899 Other long term (current) drug therapy: Secondary | ICD-10-CM | POA: Diagnosis not present

## 2014-11-10 DIAGNOSIS — J029 Acute pharyngitis, unspecified: Secondary | ICD-10-CM | POA: Diagnosis not present

## 2014-11-10 DIAGNOSIS — Z720 Tobacco use: Secondary | ICD-10-CM | POA: Diagnosis not present

## 2014-11-10 DIAGNOSIS — M546 Pain in thoracic spine: Secondary | ICD-10-CM | POA: Diagnosis not present

## 2014-11-10 DIAGNOSIS — J45901 Unspecified asthma with (acute) exacerbation: Secondary | ICD-10-CM

## 2014-11-10 DIAGNOSIS — M549 Dorsalgia, unspecified: Secondary | ICD-10-CM | POA: Diagnosis present

## 2014-11-10 LAB — URINALYSIS, ROUTINE W REFLEX MICROSCOPIC
BILIRUBIN URINE: NEGATIVE
Glucose, UA: NEGATIVE mg/dL
HGB URINE DIPSTICK: NEGATIVE
Ketones, ur: NEGATIVE mg/dL
Leukocytes, UA: NEGATIVE
Nitrite: NEGATIVE
Protein, ur: NEGATIVE mg/dL
UROBILINOGEN UA: 0.2 mg/dL (ref 0.0–1.0)
pH: 6.5 (ref 5.0–8.0)

## 2014-11-10 MED ORDER — ALBUTEROL SULFATE HFA 108 (90 BASE) MCG/ACT IN AERS
2.0000 | INHALATION_SPRAY | Freq: Once | RESPIRATORY_TRACT | Status: AC
  Administered 2014-11-10: 2 via RESPIRATORY_TRACT
  Filled 2014-11-10: qty 6.7

## 2014-11-10 MED ORDER — PREDNISONE 10 MG PO TABS: ORAL_TABLET | ORAL | Status: DC

## 2014-11-10 MED ORDER — IPRATROPIUM-ALBUTEROL 0.5-2.5 (3) MG/3ML IN SOLN
3.0000 mL | Freq: Once | RESPIRATORY_TRACT | Status: AC
  Administered 2014-11-10: 3 mL via RESPIRATORY_TRACT
  Filled 2014-11-10: qty 3

## 2014-11-10 MED ORDER — ALBUTEROL SULFATE (2.5 MG/3ML) 0.083% IN NEBU
2.5000 mg | INHALATION_SOLUTION | Freq: Once | RESPIRATORY_TRACT | Status: AC
Start: 2014-11-10 — End: 2014-11-10
  Administered 2014-11-10: 2.5 mg via RESPIRATORY_TRACT
  Filled 2014-11-10: qty 3

## 2014-11-10 MED ORDER — OXYCODONE-ACETAMINOPHEN 5-325 MG PO TABS: 1.0000 | ORAL_TABLET | ORAL | Status: DC | PRN

## 2014-11-10 MED ORDER — PREDNISONE 50 MG PO TABS
60.0000 mg | ORAL_TABLET | Freq: Once | ORAL | Status: AC
  Administered 2014-11-10: 60 mg via ORAL
  Filled 2014-11-10 (×2): qty 1

## 2014-11-10 MED ORDER — CYCLOBENZAPRINE HCL 5 MG PO TABS: 5.0000 mg | ORAL_TABLET | Freq: Three times a day (TID) | ORAL | Status: DC | PRN

## 2014-11-10 NOTE — ED Notes (Signed)
Pt offered fluids per PA order.

## 2014-11-10 NOTE — Discharge Instructions (Signed)
Asthma °Asthma is a recurring condition in which the airways tighten and narrow. Asthma can make it difficult to breathe. It can cause coughing, wheezing, and shortness of breath. Asthma episodes, also called asthma attacks, range from minor to life-threatening. Asthma cannot be cured, but medicines and lifestyle changes can help control it. °CAUSES °Asthma is believed to be caused by inherited (genetic) and environmental factors, but its exact cause is unknown. Asthma may be triggered by allergens, lung infections, or irritants in the air. Asthma triggers are different for each person. Common triggers include:  °· Animal dander. °· Dust mites. °· Cockroaches. °· Pollen from trees or grass. °· Mold. °· Smoke. °· Air pollutants such as dust, household cleaners, hair sprays, aerosol sprays, paint fumes, strong chemicals, or strong odors. °· Cold air, weather changes, and winds (which increase molds and pollens in the air). °· Strong emotional expressions such as crying or laughing hard. °· Stress. °· Certain medicines (such as aspirin) or types of drugs (such as beta-blockers). °· Sulfites in foods and drinks. Foods and drinks that may contain sulfites include dried fruit, potato chips, and sparkling grape juice. °· Infections or inflammatory conditions such as the flu, a cold, or an inflammation of the nasal membranes (rhinitis). °· Gastroesophageal reflux disease (GERD). °· Exercise or strenuous activity. °SYMPTOMS °Symptoms may occur immediately after asthma is triggered or many hours later. Symptoms include: °· Wheezing. °· Excessive nighttime or early morning coughing. °· Frequent or severe coughing with a common cold. °· Chest tightness. °· Shortness of breath. °DIAGNOSIS  °The diagnosis of asthma is made by a review of your medical history and a physical exam. Tests may also be performed. These may include: °· Lung function studies. These tests show how much air you breathe in and out. °· Allergy  tests. °· Imaging tests such as X-rays. °TREATMENT  °Asthma cannot be cured, but it can usually be controlled. Treatment involves identifying and avoiding your asthma triggers. It also involves medicines. There are 2 classes of medicine used for asthma treatment:  °· Controller medicines. These prevent asthma symptoms from occurring. They are usually taken every day. °· Reliever or rescue medicines. These quickly relieve asthma symptoms. They are used as needed and provide short-term relief. °Your health care provider will help you create an asthma action plan. An asthma action plan is a written plan for managing and treating your asthma attacks. It includes a list of your asthma triggers and how they may be avoided. It also includes information on when medicines should be taken and when their dosage should be changed. An action plan may also involve the use of a device called a peak flow meter. A peak flow meter measures how well the lungs are working. It helps you monitor your condition. °HOME CARE INSTRUCTIONS  °· Take medicines only as directed by your health care provider. Speak with your health care provider if you have questions about how or when to take the medicines. °· Use a peak flow meter as directed by your health care provider. Record and keep track of readings. °· Understand and use the action plan to help minimize or stop an asthma attack without needing to seek medical care. °· Control your home environment in the following ways to help prevent asthma attacks: °¨ Do not smoke. Avoid being exposed to secondhand smoke. °¨ Change your heating and air conditioning filter regularly. °¨ Limit your use of fireplaces and wood stoves. °¨ Get rid of pests (such as roaches and   mice) and their droppings.  Throw away plants if you see mold on them.  Clean your floors and dust regularly. Use unscented cleaning products.  Try to have someone else vacuum for you regularly. Stay out of rooms while they are  being vacuumed and for a short while afterward. If you vacuum, use a dust mask from a hardware store, a double-layered or microfilter vacuum cleaner bag, or a vacuum cleaner with a HEPA filter.  Replace carpet with wood, tile, or vinyl flooring. Carpet can trap dander and dust.  Use allergy-proof pillows, mattress covers, and box spring covers.  Wash bed sheets and blankets every week in hot water and dry them in a dryer.  Use blankets that are made of polyester or cotton.  Clean bathrooms and kitchens with bleach. If possible, have someone repaint the walls in these rooms with mold-resistant paint. Keep out of the rooms that are being cleaned and painted.  Wash hands frequently. SEEK MEDICAL CARE IF:   You have wheezing, shortness of breath, or a cough even if taking medicine to prevent attacks.  The colored mucus you cough up (sputum) is thicker than usual.  Your sputum changes from clear or white to yellow, green, gray, or bloody.  You have any problems that may be related to the medicines you are taking (such as a rash, itching, swelling, or trouble breathing).  You are using a reliever medicine more than 2-3 times per week.  Your peak flow is still at 50-79% of your personal best after following your action plan for 1 hour.  You have a fever. SEEK IMMEDIATE MEDICAL CARE IF:   You seem to be getting worse and are unresponsive to treatment during an asthma attack.  You are short of breath even at rest.  You get short of breath when doing very little physical activity.  You have difficulty eating, drinking, or talking due to asthma symptoms.  You develop chest pain.  You develop a fast heartbeat.  You have a bluish color to your lips or fingernails.  You are light-headed, dizzy, or faint.  Your peak flow is less than 50% of your personal best. MAKE SURE YOU:   Understand these instructions.  Will watch your condition.  Will get help right away if you are not  doing well or get worse. Document Released: 08/16/2005 Document Revised: 12/31/2013 Document Reviewed: 03/15/2013 [facility] Patient Information 2015 [place], [place]. This information is not intended to replace advice given to you by your health care provider. Make sure you discuss any questions you have with your health care provider.  Back Pain, Adult Low back pain is very common. About 1 in 5 people have back pain.The cause of low back pain is rarely dangerous. The pain often gets better over time.About half of people with a sudden onset of back pain feel better in just 2 weeks. About 8 in 10 people feel better by 6 weeks.  CAUSES Some common causes of back pain include:  Strain of the muscles or ligaments supporting the spine.  Wear and tear (degeneration) of the spinal discs.  Arthritis.  Direct injury to the back. DIAGNOSIS Most of the time, the direct cause of low back pain is not known.However, back pain can be treated effectively even when the exact cause of the pain is unknown.Answering your caregiver's questions about your overall health and symptoms is one of the most accurate ways to make sure the cause of your pain is not dangerous. If your caregiver needs  more information, he or she may order lab work or imaging tests (X-rays or MRIs).However, even if imaging tests show changes in your back, this usually does not require surgery. HOME CARE INSTRUCTIONS For many people, back pain returns.Since low back pain is rarely dangerous, it is often a condition that people can learn to [facility] their own.   Remain active. It is stressful on the back to sit or stand in one place. Do not sit, drive, or stand in one place for more than 30 minutes at a time. Take short walks on level surfaces as soon as pain allows.Try to increase the length of time you walk each day.  Do not stay in bed.Resting more than 1 or 2 days can delay your recovery.  Do not avoid exercise or work.Your body  is made to move.It is not dangerous to be active, even though your back may hurt.Your back will likely heal faster if you return to being active before your pain is gone.  Pay attention to your body when you bend and lift. Many people have less discomfortwhen lifting if they bend their knees, keep the load close to their bodies,and avoid twisting. Often, the most comfortable positions are those that put less stress on your recovering back.  Find a comfortable position to sleep. Use a firm mattress and lie on your side with your knees slightly bent. If you lie on your back, put a pillow under your knees.  Only take over-the-counter or prescription medicines as directed by your caregiver. Over-the-counter medicines to reduce pain and inflammation are often the most helpful.Your caregiver may prescribe muscle relaxant drugs.These medicines help dull your pain so you can more quickly return to your normal activities and healthy exercise.  Put ice on the injured area.  Put ice in a plastic bag.  Place a towel between your skin and the bag.  Leave the ice on for 15-20 minutes, 03-04 times a day for the first 2 to 3 days. After that, ice and heat may be alternated to reduce pain and spasms.  Ask your caregiver about trying back exercises and gentle massage. This may be of some benefit.  Avoid feeling anxious or stressed.Stress increases muscle tension and can worsen back pain.It is important to recognize when you are anxious or stressed and learn ways to manage it.Exercise is a great option. SEEK MEDICAL CARE IF:  You have pain that is not relieved with rest or medicine.  You have pain that does not improve in 1 week.  You have new symptoms.  You are generally not feeling well. SEEK IMMEDIATE MEDICAL CARE IF:   You have pain that radiates from your back into your legs.  You develop new bowel or bladder control problems.  You have unusual weakness or numbness in your arms or  legs.  You develop nausea or vomiting.  You develop abdominal pain.  You feel faint. Document Released: 08/16/2005 Document Revised: 02/15/2012 Document Reviewed: 12/18/2013 [facility] Patient Information 2015 [place]. This information is not intended to replace advice given to you by your health care provider. Make sure you discuss any questions you have with your health care provider.   Take your next dose of prednisone tomorrow evening.  You may take the oxycodone prescribed for pain relief.  This will make you drowsy - do not drive within 4 hours of taking this medication.  Use the muscle relaxer as instructed.  Heat therapy to your back 20 minutes several times daily as discussed.  Get rechecked for any worsened or persistent symptoms.  See the referral suggestions above.

## 2014-11-10 NOTE — ED Provider Notes (Signed)
CSN: [id number]     Arrival date & time 11/10/14  1613 History  This chart was scribed for [name] Ankur Snowdon, PA-C with No att. providers found by Tonye RoyaltyJoshua Chen, ED Scribe. This patient was seen in room APFT23/APFT23 and the patient's care was started at 4:52 PM.    Chief Complaint  Patient presents with  . Sore Throat  . Back Pain   The history is provided by the patient. No language interpreter was used.    HPI Comments: David Leach is a 39 y.o. male with frequent emergency room visits for various pain complaints,  And a history of asthma who presents complaining of sore throat with onset 2 weeks ago as well as sharp pain at his right flank which radiates to his right posterior hip similar to prior episodes of pain.  He reports associated dry cough, decreased appetite, hoarse voice, SOB, and wheezing. He states back pain is worse with movement, cough, and breathing. He has used cough drops without relief. He has used Albuterol inhaler for wheezing with improvement. He states he has used Ibuprofen for back pain, last used this morning, without improvement. He states is worst upon first waking, and improves after he is up awhile. He states he has had good fluid intake. He denies medical problems besides asthma. He states he has had pneumonia, is a smoker and is concerned about having this again.  He reports also concern about possible dehydration due to poor intake.  He denies any prior surgeries. He denies nasal congestion, rhinorrhea, fever, nausea, vomiting, chest pain, ear pain, neck pain, dysuria, frequency, or dark urine.  No PCP, per patient  Past Medical History  Diagnosis Date  . Asthma    Past Surgical History  Procedure Laterality Date  . Tonsillectomy     Family History  Problem Relation Age of Onset  . Cancer Mother   . Diabetes Father   . Cancer Other   . Stroke Other   . Diabetes Other    History  Substance Use Topics  . Smoking status: Current Every Day Smoker -- 0.50  packs/day for 20 years    Types: Cigarettes  . Smokeless tobacco: Former NeurosurgeonUser    Types: Chew  . Alcohol Use: Yes     Comment: occ    Review of Systems  Constitutional: Positive for appetite change. Negative for fever.  HENT: Positive for sore throat and voice change. Negative for congestion, ear pain and rhinorrhea.   Respiratory: Positive for cough, shortness of breath and wheezing.   Cardiovascular: Negative for chest pain.  Gastrointestinal: Negative for nausea and vomiting.  Genitourinary: Negative for dysuria and frequency.  Musculoskeletal: Positive for back pain. Negative for neck pain.  Neurological: Positive for light-headedness.      Allergies  Hydrocodone  Home Medications   Prior to Admission medications   Medication Sig Start Date End Date Taking? Authorizing Provider  albuterol (PROVENTIL HFA;VENTOLIN HFA) 108 (90 BASE) MCG/ACT inhaler Inhale 2 puffs into the lungs every 4 (four) hours as needed for wheezing or shortness of breath.    Historical Provider, MD  cyclobenzaprine (FLEXERIL) 5 MG tablet Take 1 tablet (5 mg total) by mouth 3 (three) times daily as needed. 11/10/14   [name] Brookes Craine, PA-C  ibuprofen (ADVIL,MOTRIN) 600 MG tablet Take 1 tablet (600 mg total) by mouth every 8 (eight) hours as needed. 05/26/14   [name] Oluwanifemi Petitti, PA-C  meloxicam (MOBIC) 7.5 MG tablet Take 1 tablet (7.5 mg total) by mouth daily. 07/21/14  Junius Finner, PA-C  methocarbamol (ROBAXIN) 500 MG tablet Take 1 tablet (500 mg total) by mouth 2 (two) times daily. 07/21/14   Junius Finner, PA-C  naproxen (NAPROSYN) 500 MG tablet Take 1 tablet (500 mg total) by mouth 2 (two) times daily. 07/27/14   Hope Orlene Och, NP  oxyCODONE-acetaminophen (PERCOCET/ROXICET) 5-325 MG per tablet Take 1 tablet by mouth every 4 (four) hours as needed. 11/10/14   Burgess Amor, PA-C  predniSONE (DELTASONE) 10 MG tablet 6, 5, 4, 3, 2 then 1 tablet by mouth daily for 6 days total. 11/10/14   Burgess Amor, PA-C  silver sulfADIAZINE  (SILVADENE) 1 % cream Apply 1 application topically daily. 06/15/14   Hope Orlene Och, NP  traMADol (ULTRAM) 50 MG tablet Take 1 tablet (50 mg total) by mouth every 6 (six) hours as needed. 05/26/14   Burgess Amor, PA-C   BP 134/65 mmHg  Pulse 79  Temp(Src) 98.1 F (36.7 C) (Oral)  Resp 18  Ht  (1.803 m)  Wt 195 lb (88.451 kg)  BMI 27.21 kg/m2  SpO2 100% Physical Exam  Constitutional: He is oriented to person, place, and time. He appears well-developed and well-nourished.  HENT:  Head: Normocephalic and atraumatic.  Right Ear: Tympanic membrane, external ear and ear canal normal.  Left Ear: Tympanic membrane, external ear and ear canal normal.  Nose: Nose normal. Right sinus exhibits no maxillary sinus tenderness and no frontal sinus tenderness. Left sinus exhibits no maxillary sinus tenderness and no frontal sinus tenderness.  Mouth/Throat: Uvula is midline, oropharynx is clear and moist and mucous membranes are normal. No trismus in the jaw. No uvula swelling. No oropharyngeal exudate, posterior oropharyngeal edema, posterior oropharyngeal erythema or tonsillar abscesses.  Eyes: Conjunctivae are normal.  Neck: Normal range of motion.  Cardiovascular: Normal rate and normal heart sounds.   No murmur heard. Pulses equal bilaterally  Pulmonary/Chest: Effort normal. No respiratory distress. Wheezes: bilateral expiratory wheezing with reduced air movement  He has no rales.  Tender to palpation over right lower posterior ribs with no deformity or crepitus  Abdominal: Soft. There is no tenderness.  Musculoskeletal: Normal range of motion. He exhibits tenderness. He exhibits no edema.  Neurological: He is alert and oriented to person, place, and time. He has normal strength. He displays normal reflexes. No sensory deficit.  Skin: Skin is warm and dry. No rash noted.  Psychiatric: He has a normal mood and affect.  Nursing note and vitals reviewed.   ED Course  Procedures (including  critical care time)  DIAGNOSTIC STUDIES: Oxygen Saturation is 100% on room air, normal by my interpretation.    COORDINATION OF CARE: 5:04 PM Discussed treatment plan with patient at beside, including chest x-ray and breathing treatment. The patient agrees with the plan and has no further questions at this time.   Labs Review Labs Reviewed  URINALYSIS, ROUTINE W REFLEX MICROSCOPIC - Abnormal; Notable for the following:    Specific Gravity, Urine <1.005 (*)    All other components within normal limits    Imaging Review Dg Chest 2 View  11/10/2014   CLINICAL DATA:  Sore throat. Cough for 2.5 weeks. Right posterior chest and back pain. Shortness of breath.  EXAM: CHEST  2 VIEW  COMPARISON:  11/06/2014  FINDINGS: The heart size and mediastinal contours are within normal limits. Both lungs are clear. The visualized skeletal structures are unremarkable.  IMPRESSION: No active cardiopulmonary disease.   Electronically Signed   By: Annitta Needs.D.  On: 11/10/2014 17:36     EKG Interpretation None       MDM   Final diagnoses:  Asthma exacerbation  Right-sided thoracic back pain    Medications  ipratropium-albuterol (DUONEB) 0.5-2.5 (3) MG/3ML nebulizer solution 3 mL (3 mLs Nebulization Given 11/10/14 1735)  albuterol (PROVENTIL) (2.5 MG/3ML) 0.083% nebulizer solution 2.5 mg (2.5 mg Nebulization Given 11/10/14 1735)  predniSONE (DELTASONE) tablet 60 mg (60 mg Oral Given 11/10/14 1719)  albuterol (PROVENTIL HFA;VENTOLIN HFA) 108 (90 BASE) MCG/ACT inhaler 2 puff (2 puffs Inhalation Given 11/10/14 1850)   Pt given albuterol/atrovent neb, prednisone taper with resolution of wheezing. Sx improved, still with chest wall pain,  cxr reviewed, discussed with pt - no respiratory infection present.  He was prescribed prednisone taper, albuterol mdi given, flexeril, few oxycodone for chronic back pain. Advised he needs a pcp and referrals were given.    The patient appears reasonably  screened and/or stabilized for discharge and I doubt any other medical condition or other [facility] requiring further screening, evaluation, or treatment in the ED at this time prior to discharge.   I personally performed the services described in this documentation, which was scribed in my presence. The recorded information has been reviewed and is accurate.   Burgess Amor, PA-C 11/11/14 1433  Doug Sou, MD 11/13/14 [phone]

## 2014-11-10 NOTE — ED Notes (Signed)
Pt c/o R sided back pain, sore throat and pain with breathing. Pt reports pain is increased with some mvmts.

## 2014-11-10 NOTE — ED Notes (Signed)
RT aware

## 2014-11-18 ENCOUNTER — Encounter (HOSPITAL_COMMUNITY): Payer: Self-pay | Admitting: Emergency Medicine

## 2014-11-18 ENCOUNTER — Emergency Department (HOSPITAL_COMMUNITY)
Admission: EM | Admit: 2014-11-18 | Discharge: 2014-11-18 | Disposition: A | Discharge status: Home / Self Care | Payer: Medicaid Other | Attending: Emergency Medicine | Admitting: Emergency Medicine

## 2014-11-18 ENCOUNTER — Emergency Department (HOSPITAL_COMMUNITY): Payer: Medicaid Other

## 2014-11-18 DIAGNOSIS — M545 Low back pain: Secondary | ICD-10-CM | POA: Insufficient documentation

## 2014-11-18 DIAGNOSIS — Z7901 Long term (current) use of anticoagulants: Secondary | ICD-10-CM | POA: Insufficient documentation

## 2014-11-18 DIAGNOSIS — Z720 Tobacco use: Secondary | ICD-10-CM | POA: Insufficient documentation

## 2014-11-18 DIAGNOSIS — Z79899 Other long term (current) drug therapy: Secondary | ICD-10-CM | POA: Diagnosis not present

## 2014-11-18 DIAGNOSIS — M549 Dorsalgia, unspecified: Secondary | ICD-10-CM

## 2014-11-18 DIAGNOSIS — J45901 Unspecified asthma with (acute) exacerbation: Secondary | ICD-10-CM | POA: Insufficient documentation

## 2014-11-18 DIAGNOSIS — J40 Bronchitis, not specified as acute or chronic: Secondary | ICD-10-CM

## 2014-11-18 DIAGNOSIS — Z7952 Long term (current) use of systemic steroids: Secondary | ICD-10-CM | POA: Insufficient documentation

## 2014-11-18 DIAGNOSIS — R05 Cough: Secondary | ICD-10-CM | POA: Diagnosis present

## 2014-11-18 LAB — BASIC METABOLIC PANEL
Anion gap: 8 (ref 5–15)
BUN: 10 mg/dL (ref 6–23)
CO2: 28 mmol/L (ref 19–32)
Calcium: 9 mg/dL (ref 8.4–10.5)
Chloride: 102 mmol/L (ref 96–112)
Creatinine, Ser: 1.04 mg/dL (ref 0.50–1.35)
GFR calc Af Amer: >90 mL/min (ref >90)
GFR calc non Af Amer: >90 mL/min (ref >90)
Glucose, Bld: 110 mg/dL — ABNORMAL HIGH (ref 70–99)
Potassium: 4.6 mmol/L (ref 3.5–5.1)
Sodium: 138 mmol/L (ref 135–145)

## 2014-11-18 LAB — CBC
HCT: 46.3 % (ref 39.0–52.0)
Hemoglobin: 15.2 g/dL (ref 13.0–17.0)
MCH: 30.8 pg (ref 26.0–34.0)
MCHC: 32.8 g/dL (ref 30.0–36.0)
MCV: 93.9 fL (ref 78.0–100.0)
Platelets: 260 10*3/uL (ref 150–400)
RBC: 4.93 MIL/uL (ref 4.22–5.81)
RDW: 13.3 % (ref 11.5–15.5)
WBC: 12.9 10*3/uL — ABNORMAL HIGH (ref 4.0–10.5)

## 2014-11-18 MED ORDER — DEXAMETHASONE 4 MG PO TABS: 16.0000 mg | ORAL_TABLET | Freq: Once | ORAL | Status: DC

## 2014-11-18 MED ORDER — HYDROMORPHONE HCL 1 MG/ML IJ SOLN
1.0000 mg | Freq: Once | INTRAMUSCULAR | Status: AC
  Administered 2014-11-18: 1 mg via INTRAMUSCULAR
  Filled 2014-11-18: qty 1

## 2014-11-18 MED ORDER — DEXAMETHASONE 4 MG PO TABS
16.0000 mg | ORAL_TABLET | Freq: Once | ORAL | Status: AC
  Administered 2014-11-18: 16 mg via ORAL
  Filled 2014-11-18: qty 4

## 2014-11-18 MED ORDER — IPRATROPIUM-ALBUTEROL 0.5-2.5 (3) MG/3ML IN SOLN
3.0000 mL | Freq: Once | RESPIRATORY_TRACT | Status: AC
  Administered 2014-11-18: 3 mL via RESPIRATORY_TRACT
  Filled 2014-11-18: qty 3

## 2014-11-18 MED ORDER — OXYCODONE-ACETAMINOPHEN 5-325 MG PO TABS: 1.0000 | ORAL_TABLET | ORAL | Status: DC | PRN

## 2014-11-18 MED ORDER — KETOROLAC TROMETHAMINE 30 MG/ML IJ SOLN
30.0000 mg | Freq: Once | INTRAMUSCULAR | Status: AC
  Administered 2014-11-18: 30 mg via INTRAMUSCULAR
  Filled 2014-11-18: qty 1

## 2014-11-18 NOTE — ED Notes (Addendum)
Pt reports seen for same last week. Pt reports has tried a round of abx with no relief. Pt reports continued cough,sore throat. Pt reports used nebulizer and inhaler at home with no relief. nad noted. Pt also reports slipped on the side of a roof. Pt denies falling off of roof.pt reports right sided rib cage pain that is worse with inspiration. Chest rise and fall symmetrical. Pt also has left forearm abrasions.

## 2014-11-18 NOTE — Discharge Instructions (Signed)
Laryngitis °At the top of your windpipe is your voice box. It is the source of your voice. Inside your voice box are 2 bands of muscles called vocal cords. When you breathe, your vocal cords are relaxed and open so that air can get into the lungs. When you decide to say something, these cords come together and vibrate. The sound from these vibrations goes into your throat and comes out through your mouth as sound.  °Laryngitis is an inflammation of the vocal cords that causes hoarseness, cough, loss of voice, sore throat, and dry throat. Laryngitis can be temporary (acute) or long-term (chronic). Most cases of acute laryngitis improve with time.Chronic laryngitis lasts for more than 3 weeks. °CAUSES °Laryngitis can often be related to excessive smoking, talking, or yelling, as well as inhalation of toxic fumes and allergies. Acute laryngitis is usually caused by a viral infection, vocal strain, measles or mumps, or bacterial infections. Chronic laryngitis is usually caused by vocal cord strain, vocal cord injury, postnasal drip, growths on the vocal cords, or acid reflux. °SYMPTOMS  °· Cough. °· Sore throat. °· Dry throat. °RISK FACTORS °· Respiratory infections. °· Exposure to irritating substances, such as cigarette smoke, excessive amounts of alcohol, stomach acids, and workplace chemicals. °· Voice trauma, such as vocal cord injury from shouting or speaking too loud. °DIAGNOSIS  °Your cargiver will perform a physical exam. During the physical exam, your caregiver will examine your throat. The most common sign of laryngitis is hoarseness. Laryngoscopy may be necessary to confirm the diagnosis of this condition. This procedure allows your caregiver to look into the larynx. °HOME CARE INSTRUCTIONS °· Drink enough fluids to keep your urine clear or pale yellow. °· Rest until you no longer have symptoms or as directed by your caregiver. °· Breathe in moist air. °· Take all medicine as directed by your  caregiver. °· Do not smoke. °· Talk as little as possible (this includes whispering). °· Write on paper instead of talking until your voice is back to normal. °· Follow up with your caregiver if your condition has not improved after 10 days. °SEEK MEDICAL CARE IF:  °· You have trouble breathing. °· You cough up blood. °· You have persistent fever. °· You have increasing pain. °· You have difficulty swallowing. °MAKE SURE YOU: °· Understand these instructions. °· Will watch your condition. °· Will get help right away if you are not doing well or get worse. °Document Released: 08/16/2005 Document Revised: 11/08/2011 Document Reviewed: 10/22/2010 °[employer]® Patient Information ©2015 [employer]. This information is not intended to replace advice given to you by your health care provider. Make sure you discuss any questions you have with your health care provider. ° °

## 2014-11-18 NOTE — ED Provider Notes (Signed)
CSN: [id number]     Arrival date & time 11/18/14  1003 History  This chart was scribed for Raeford Razor, MD by Ronney Lion, ED Scribe. This patient was seen in room APA05/APA05 and the patient's care was started at 1:52 PM.    Chief Complaint  Patient presents with  . Cough   Patient is a 39 y.o. male presenting with pharyngitis. The history is provided by the patient. No language interpreter was used.  Sore Throat This is a new problem. The current episode started more than 1 week ago. The problem occurs constantly. The problem has been gradually worsening. Pertinent negatives include no chest pain, no abdominal pain, no headaches and no shortness of breath. Nothing aggravates the symptoms. Nothing relieves the symptoms. He has tried nothing for the symptoms.     HPI Comments: David Leach is a 39 y.o. male with a history of asthma who presents to the Emergency Department complaining of a constant sore throat that began 3 weeks ago, and constant, right sided lower back pain. Patient speaks with a very soft, hoarse voice. He endorses cough, wheezing, and right hip pain that is exacerbated by walking. Patient was seen for the same symptoms 1 week ago, but reports worsening symptoms since. He was given albuterol and Prednisone, which he took with no relief. He was also given Percocet which he states somewhat alleviated his low back pain. Patient tried following up with his PCP, but was unable to make an appointment for several weeks and states he is unable to wait that long due to severity of the pain. He is a smoker. He denies any urinary symptoms, leg pain, or leg swelling.   Past Medical History  Diagnosis Date  . Asthma    Past Surgical History  Procedure Laterality Date  . Tonsillectomy     Family History  Problem Relation Age of Onset  . Cancer Mother   . Diabetes Father   . Cancer Other   . Stroke Other   . Diabetes Other    History  Substance Use Topics  . Smoking status:  Current Every Day Smoker -- 0.50 packs/day for 20 years    Types: Cigarettes  . Smokeless tobacco: Former Neurosurgeon    Types: Chew  . Alcohol Use: Yes     Comment: occ    Review of Systems  HENT: Positive for sore throat and voice change.   Respiratory: Positive for cough and wheezing. Negative for shortness of breath.   Cardiovascular: Negative for chest pain and leg swelling.  Gastrointestinal: Negative for abdominal pain.  Musculoskeletal: Positive for back pain and arthralgias (right hip pain).  Neurological: Negative for headaches.  All other systems reviewed and are negative.     Allergies  Hydrocodone  Home Medications   Prior to Admission medications   Medication Sig Start Date End Date Taking? Authorizing Provider  albuterol (PROVENTIL HFA;VENTOLIN HFA) 108 (90 BASE) MCG/ACT inhaler Inhale 2 puffs into the lungs every 4 (four) hours as needed for wheezing or shortness of breath.   Yes Historical Provider, MD  cyclobenzaprine (FLEXERIL) 5 MG tablet Take 1 tablet (5 mg total) by mouth 3 (three) times daily as needed. 11/10/14  Yes Burgess Amor, PA-C  ibuprofen (ADVIL,MOTRIN) 200 MG tablet Take 400-800 mg by mouth every 6 (six) hours as needed for moderate pain.   Yes Historical Provider, MD  ibuprofen (ADVIL,MOTRIN) 600 MG tablet Take 1 tablet (600 mg total) by mouth every 8 (eight) hours as needed. Patient  not taking: Reported on 11/18/2014 05/26/14   [name] Idol, PA-C  meloxicam (MOBIC) 7.5 MG tablet Take 1 tablet (7.5 mg total) by mouth daily. Patient not taking: Reported on 11/18/2014 07/21/14   Junius FinnerErin O'Malley, PA-C  methocarbamol (ROBAXIN) 500 MG tablet Take 1 tablet (500 mg total) by mouth 2 (two) times daily. Patient not taking: Reported on 11/18/2014 07/21/14   Junius FinnerErin O'Malley, PA-C  naproxen (NAPROSYN) 500 MG tablet Take 1 tablet (500 mg total) by mouth 2 (two) times daily. Patient not taking: Reported on 11/18/2014 07/27/14   Janne NapoleonHope M Neese, NP  oxyCODONE-acetaminophen  (PERCOCET/ROXICET) 5-325 MG per tablet Take 1 tablet by mouth every 4 (four) hours as needed. Patient not taking: Reported on 11/18/2014 11/10/14   [name] Idol, PA-C  predniSONE (DELTASONE) 10 MG tablet 6, 5, 4, 3, 2 then 1 tablet by mouth daily for 6 days total. Patient not taking: Reported on 11/18/2014 11/10/14   [name] Idol, PA-C  silver sulfADIAZINE (SILVADENE) 1 % cream Apply 1 application topically daily. Patient not taking: Reported on 11/18/2014 06/15/14   Janne NapoleonHope M Neese, NP  traMADol (ULTRAM) 50 MG tablet Take 1 tablet (50 mg total) by mouth every 6 (six) hours as needed. Patient not taking: Reported on 11/18/2014 05/26/14   [name] Idol, PA-C   BP 124/80 mmHg  Pulse 75  Temp(Src) 97.9 F (36.6 C) (Oral)  Resp 18  Ht 5\' 11"  (1.803 m)  Wt 195 lb (88.451 kg)  BMI 27.21 kg/m2  SpO2 98% Physical Exam  Constitutional: He appears well-developed and well-nourished. No distress.  HENT:  Head: Normocephalic and atraumatic.  Eyes: Conjunctivae are normal. Right eye exhibits no discharge. Left eye exhibits no discharge.  Neck: Neck supple.  Cardiovascular: Normal rate, regular rhythm and normal heart sounds.  Exam reveals no gallop and no friction rub.   No murmur heard. Pulmonary/Chest: Effort normal and breath sounds normal. No respiratory distress.  Bilateral wheezing.  Abdominal: Soft. He exhibits no distension. There is no tenderness.  Musculoskeletal: He exhibits tenderness. He exhibits no edema.  Moderate tenderness to palpation of the right mid back. No midline tenderness.   Neurological: He is alert.  Skin: Skin is warm and dry.  Psychiatric: He has a normal mood and affect. His behavior is normal. Thought content normal.  Nursing note and vitals reviewed.   ED Course  Procedures (including critical care time)  DIAGNOSTIC STUDIES: Oxygen Saturation is 98% on room air, normal by my interpretation.    COORDINATION OF CARE: 1:54 PM - Discussed treatment plan with pt at bedside  which includes steroids, and pt agreed to plan.   Labs Review Labs Reviewed  BASIC METABOLIC PANEL - Abnormal; Notable for the following:    Glucose, Bld 110 (*)    All other components within normal limits  CBC - Abnormal; Notable for the following:    WBC 12.9 (*)    All other components within normal limits    Imaging Review Dg Chest 2 View (if Patient Has Fever And/or Copd)  11/18/2014   CLINICAL DATA:  Cough and congestion for 3 weeks  EXAM: CHEST  2 VIEW  COMPARISON:  11/10/2014  FINDINGS: The heart size and mediastinal contours are within normal limits. Both lungs are clear. The visualized skeletal structures are unremarkable.  IMPRESSION: No active cardiopulmonary disease.   Electronically Signed   By: Alcide CleverMark  Lukens M.D.   On: 11/18/2014 10:55     EKG Interpretation None      MDM  Final diagnoses:  Bronchitis  Mid back pain on right side   38yM with continued cough, congestion. Mild wheezing on exam, but no increased WOB. o2 sats normal on RA. CXR w/o acute abnormality. Suspect viral bronchitis. No clear role for abx. Reports has MDI and nebs. Dose of decadron in ED. Another dose tomorrow. PRN pain meds for likely musculoskeletal back pain exacerbated after recent fall. Neurologically intact.  It has been determined that no acute conditions requiring further emergency intervention are present at this time. The patient has been advised of the diagnosis and plan. I reviewed any labs and imaging including any potential incidental findings. We have discussed signs and symptoms that warrant return to the ED and they are listed in the discharge instructions.    I personally preformed the services scribed in my presence. The recorded information has been reviewed is accurate. Raeford Razor, MD.    Raeford Razor, MD 11/18/14 [phone]

## 2014-11-18 NOTE — ED Notes (Signed)
No answer when called for v/s

## 2015-02-07 ENCOUNTER — Encounter: Payer: Self-pay | Admitting: Emergency Medicine

## 2015-02-07 ENCOUNTER — Emergency Department
Admission: EM | Admit: 2015-02-07 | Discharge: 2015-02-07 | Disposition: A | Discharge status: Home / Self Care | Payer: Medicaid Other | Attending: Emergency Medicine | Admitting: Emergency Medicine

## 2015-02-07 ENCOUNTER — Emergency Department: Payer: Medicaid Other

## 2015-02-07 DIAGNOSIS — R51 Headache: Secondary | ICD-10-CM | POA: Insufficient documentation

## 2015-02-07 DIAGNOSIS — Z87828 Personal history of other (healed) physical injury and trauma: Secondary | ICD-10-CM | POA: Insufficient documentation

## 2015-02-07 DIAGNOSIS — Z720 Tobacco use: Secondary | ICD-10-CM | POA: Insufficient documentation

## 2015-02-07 DIAGNOSIS — R519 Headache, unspecified: Secondary | ICD-10-CM

## 2015-02-07 LAB — CBC WITH DIFFERENTIAL/PLATELET
BASOS PCT: 1 %
Basophils Absolute: 0.1 10*3/uL (ref 0–0.1)
Eosinophils Absolute: 0.2 10*3/uL (ref 0–0.7)
Eosinophils Relative: 2 %
HCT: 42.2 % (ref 40.0–52.0)
Hemoglobin: 14.1 g/dL (ref 13.0–18.0)
LYMPHS ABS: 1.8 10*3/uL (ref 1.0–3.6)
Lymphocytes Relative: 16 %
MCH: 31 pg (ref 26.0–34.0)
MCHC: 33.4 g/dL (ref 32.0–36.0)
MCV: 93 fL (ref 80.0–100.0)
Monocytes Absolute: 0.7 10*3/uL (ref 0.2–1.0)
Monocytes Relative: 6 %
NEUTROS ABS: 8.6 10*3/uL — AB (ref 1.4–6.5)
NEUTROS PCT: 75 %
Platelets: 228 10*3/uL (ref 150–440)
RBC: 4.54 MIL/uL (ref 4.40–5.90)
RDW: 14.2 % (ref 11.5–14.5)
WBC: 11.3 10*3/uL — ABNORMAL HIGH (ref 3.8–10.6)

## 2015-02-07 LAB — COMPREHENSIVE METABOLIC PANEL
ALT: 18 U/L (ref 17–63)
ANION GAP: 7 (ref 5–15)
AST: 25 U/L (ref 15–41)
Albumin: 3.8 g/dL (ref 3.5–5.0)
Alkaline Phosphatase: 85 U/L (ref 38–126)
BUN: 13 mg/dL (ref 6–20)
CHLORIDE: 107 mmol/L (ref 101–111)
CO2: 27 mmol/L (ref 22–32)
CREATININE: 1.14 mg/dL (ref 0.61–1.24)
Calcium: 8.8 mg/dL — ABNORMAL LOW (ref 8.9–10.3)
GFR calc Af Amer: >60 mL/min (ref >60)
GFR calc non Af Amer: >60 mL/min (ref >60)
GLUCOSE: 142 mg/dL — AB (ref 65–99)
Potassium: 3.8 mmol/L (ref 3.5–5.1)
SODIUM: 141 mmol/L (ref 135–145)
Total Bilirubin: 0.5 mg/dL (ref 0.3–1.2)
Total Protein: 6.3 g/dL — ABNORMAL LOW (ref 6.5–8.1)

## 2015-02-07 MED ORDER — METOCLOPRAMIDE HCL 5 MG/ML IJ SOLN
10.0000 mg | Freq: Once | INTRAMUSCULAR | Status: AC
  Administered 2015-02-07: 10 mg via INTRAVENOUS

## 2015-02-07 MED ORDER — KETOROLAC TROMETHAMINE 30 MG/ML IJ SOLN
30.0000 mg | Freq: Once | INTRAMUSCULAR | Status: AC
  Administered 2015-02-07: 30 mg via INTRAVENOUS

## 2015-02-07 MED ORDER — HYDROMORPHONE HCL 1 MG/ML IJ SOLN
INTRAMUSCULAR | Status: AC
  Administered 2015-02-07: 1 mg via INTRAVENOUS
  Filled 2015-02-07: qty 1

## 2015-02-07 MED ORDER — KETOROLAC TROMETHAMINE 30 MG/ML IJ SOLN
INTRAMUSCULAR | Status: AC
  Administered 2015-02-07: 30 mg via INTRAVENOUS
  Filled 2015-02-07: qty 1

## 2015-02-07 MED ORDER — SUMATRIPTAN SUCCINATE 50 MG PO TABS: 50.0000 mg | ORAL_TABLET | Freq: Once | ORAL | Status: DC | PRN

## 2015-02-07 MED ORDER — SODIUM CHLORIDE 0.9 % IV SOLN
1000.0000 mL | Freq: Once | INTRAVENOUS | Status: AC
  Administered 2015-02-07: 1000 mL via INTRAVENOUS

## 2015-02-07 MED ORDER — HYDROMORPHONE HCL 1 MG/ML IJ SOLN
1.0000 mg | Freq: Once | INTRAMUSCULAR | Status: AC
  Administered 2015-02-07: 1 mg via INTRAVENOUS

## 2015-02-07 MED ORDER — METOCLOPRAMIDE HCL 5 MG/ML IJ SOLN
INTRAMUSCULAR | Status: AC
  Administered 2015-02-07: 10 mg via INTRAVENOUS
  Filled 2015-02-07: qty 2
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
## 2015-02-07 NOTE — ED Notes (Signed)
Pt given phone to call wife and update. Pt in no acute distress at this time.
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
## 2015-02-07 NOTE — Discharge Instructions (Signed)
General Headache Without Cause  A general headache is pain or discomfort felt around the head or neck area. The cause may not be found.   HOME CARE   · Keep all doctor visits.  · Only take medicines as told by your doctor.  · Lie down in a dark, quiet room when you have a headache.  · Keep a journal to find out if certain things bring on headaches. For example, write down:  ¨ What you eat and drink.  ¨ How much sleep you get.  ¨ Any change to your diet or medicines.  · Relax by getting a massage or doing other relaxing activities.  · Put ice or heat packs on the head and neck area as told by your doctor.  · Lessen stress.  · Sit up straight. Do not tighten (tense) your muscles.  · Quit smoking if you smoke.  · Lessen how much alcohol you drink.  · Lessen how much caffeine you drink, or stop drinking caffeine.  · Eat and sleep on a regular schedule.  · Get 7 to 9 hours of sleep, or as told by your doctor.  · Keep lights dim if bright lights bother you or make your headaches worse.  GET HELP RIGHT AWAY IF:   · Your headache becomes really bad.  · You have a fever.  · You have a stiff neck.  · You have trouble seeing.  · Your muscles are weak, or you lose muscle control.  · You lose your balance or have trouble walking.  · You feel like you will pass out (faint), or you pass out.  · You have really bad symptoms that are different than your first symptoms.  · You have problems with the medicines given to you by your doctor.  · Your medicines do not work.  · Your headache feels different than the other headaches.  · You feel sick to your stomach (nauseous) or throw up (vomit).  MAKE SURE YOU:   · Understand these instructions.  · Will watch your condition.  · Will get help right away if you are not doing well or get worse.  Document Released: 05/25/2008 Document Revised: 11/08/2011 Document Reviewed: 08/06/2011  ExitCare® Patient Information ©2015 [employer]. This information is not intended to replace advice given to  you by your health care provider. Make sure you discuss any questions you have with your health care provider.

## 2015-03-04 ENCOUNTER — Encounter (HOSPITAL_COMMUNITY): Payer: Self-pay | Admitting: Emergency Medicine

## 2015-03-04 ENCOUNTER — Emergency Department (HOSPITAL_COMMUNITY)
Admission: EM | Admit: 2015-03-04 | Discharge: 2015-03-04 | Disposition: A | Discharge status: Home / Self Care | Payer: Medicaid Other | Attending: Emergency Medicine | Admitting: Emergency Medicine

## 2015-03-04 DIAGNOSIS — J45909 Unspecified asthma, uncomplicated: Secondary | ICD-10-CM | POA: Diagnosis not present

## 2015-03-04 DIAGNOSIS — X58XXXA Exposure to other specified factors, initial encounter: Secondary | ICD-10-CM | POA: Diagnosis not present

## 2015-03-04 DIAGNOSIS — Y9389 Activity, other specified: Secondary | ICD-10-CM | POA: Diagnosis not present

## 2015-03-04 DIAGNOSIS — Y92007 Garden or yard of unspecified non-institutional (private) residence as the place of occurrence of the external cause: Secondary | ICD-10-CM | POA: Diagnosis not present

## 2015-03-04 DIAGNOSIS — Z79899 Other long term (current) drug therapy: Secondary | ICD-10-CM | POA: Insufficient documentation

## 2015-03-04 DIAGNOSIS — Y990 Civilian activity done for income or pay: Secondary | ICD-10-CM | POA: Diagnosis not present

## 2015-03-04 DIAGNOSIS — S161XXA Strain of muscle, fascia and tendon at neck level, initial encounter: Secondary | ICD-10-CM | POA: Diagnosis not present

## 2015-03-04 DIAGNOSIS — Z720 Tobacco use: Secondary | ICD-10-CM | POA: Diagnosis not present

## 2015-03-04 DIAGNOSIS — S199XXA Unspecified injury of neck, initial encounter: Secondary | ICD-10-CM | POA: Diagnosis present

## 2015-03-04 MED ORDER — NAPROXEN 500 MG PO TABS: 500.0000 mg | ORAL_TABLET | Freq: Two times a day (BID) | ORAL | Status: DC

## 2015-03-04 MED ORDER — METHOCARBAMOL 500 MG PO TABS
500.0000 mg | ORAL_TABLET | Freq: Three times a day (TID) | ORAL | Status: DC
Start: 2015-03-04 — End: 2015-11-15

## 2015-03-04 MED ORDER — OXYCODONE-ACETAMINOPHEN 5-325 MG PO TABS: 1.0000 | ORAL_TABLET | ORAL | Status: DC | PRN

## 2015-03-04 NOTE — Discharge Instructions (Signed)
Cervical Sprain A cervical sprain is when the tissues (ligaments) that hold the neck bones in place stretch or tear. HOME CARE   Put ice on the injured area.  Put ice in a plastic bag.  Place a towel between your skin and the bag.  Leave the ice on for 15-20 minutes, 3-4 times a day.  You may have been given a collar to wear. This collar keeps your neck from moving while you heal.  Do not take the collar off unless told by your doctor.  If you have long hair, keep it outside of the collar.  Ask your doctor before changing the position of your collar. You may need to change its position over time to make it more comfortable.  If you are allowed to take off the collar for cleaning or bathing, follow your doctor's instructions on how to do it safely.  Keep your collar clean by wiping it with mild soap and water. Dry it completely. If the collar has removable pads, remove them every 1-2 days to hand wash them with soap and water. Allow them to air dry. They should be dry before you wear them in the collar.  Do not drive while wearing the collar.  Only take medicine as told by your doctor.  Keep all doctor visits as told.  Keep all physical therapy visits as told.  Adjust your work station so that you have good posture while you work.  Avoid positions and activities that make your problems worse.  Warm up and stretch before being active. GET HELP IF:  Your pain is not controlled with medicine.  You cannot take less pain medicine over time as planned.  Your activity level does not improve as expected. GET HELP RIGHT AWAY IF:   You are bleeding.  Your stomach is upset.  You have an allergic reaction to your medicine.  You develop new problems that you cannot explain.  You lose feeling (become numb) or you cannot move any part of your body (paralysis).  You have tingling or weakness in any part of your body.  Your symptoms get worse. Symptoms include:  Pain,  soreness, stiffness, puffiness (swelling), or a burning feeling in your neck.  Pain when your neck is touched.  Shoulder or upper back pain.  Limited ability to move your neck.  Headache.  Dizziness.  Your hands or arms feel week, lose feeling, or tingle.  Muscle spasms.  Difficulty swallowing or chewing. MAKE SURE YOU:   Understand these instructions.  Will watch your condition.  Will get help right away if you are not doing well or get worse. Document Released: 02/02/2008 Document Revised: 04/18/2013 Document Reviewed: 02/21/2013 ExitCare Patient Information 2015 [employer]. This information is not intended to replace advice given to you by your health care provider. Make sure you discuss any questions you have with your health care provider.  

## 2015-03-04 NOTE — ED Provider Notes (Signed)
CSN: [id number]     Arrival date & time 03/04/15  1232 History   First MD Initiated Contact with Patient 03/04/15 1255     Chief Complaint  Patient presents with  . Neck Pain     (Consider location/radiation/quality/duration/timing/severity/associated sxs/prior Treatment) HPI   David Leach is a 39 y.o. male who presents to the Emergency Department complaining of right sided neck pain for 3 days.  He states the pain began after returning back to work recently and doing yard work and using a chain saw.  He describes a sharp burning sensation from the right neck that radiates into his shoulder and around the shoulder blade.  Pain is worse with movement of the neck and arm.  Nothing seems to relieve it.  He denies numbness or weakness of the upper extremities, dizziness, fever, dizziness or constant headache although he does report frequent migraines.     Past Medical History  Diagnosis Date  . Asthma    Past Surgical History  Procedure Laterality Date  . Tonsillectomy     Family History  Problem Relation Age of Onset  . Cancer Mother   . Diabetes Father   . Cancer Other   . Stroke Other   . Diabetes Other    History  Substance Use Topics  . Smoking status: Current Every Day Smoker -- 0.50 packs/day for 20 years    Types: Cigarettes  . Smokeless tobacco: Former Neurosurgeon    Types: Chew  . Alcohol Use: Yes     Comment: occ    Review of Systems  Constitutional: Negative for fever and chills.  Genitourinary: Negative for dysuria and difficulty urinating.  Musculoskeletal: Positive for neck pain. Negative for joint swelling.  Skin: Negative for color change and wound.  Neurological: Negative for dizziness, weakness and numbness.  All other systems reviewed and are negative.     Allergies  Hydrocodone  Home Medications   Prior to Admission medications   Medication Sig Start Date End Date Taking? Authorizing Provider  albuterol (PROVENTIL HFA;VENTOLIN HFA) 108 (90  BASE) MCG/ACT inhaler Inhale 2 puffs into the lungs every 4 (four) hours as needed for wheezing or shortness of breath.   Yes Historical Provider, MD  SUMAtriptan (IMITREX) 50 MG tablet Take 1 tablet (50 mg total) by mouth once as needed for migraine. May repeat in 2 hours if headache persists or recurs. 02/07/15 02/07/16 Yes Emily Filbert, MD   BP 112/70 mmHg  Pulse 87  Temp(Src) 98.5 F (36.9 C) (Oral)  Resp 16  Ht  (1.803 m)  Wt 185 lb (83.915 kg)  BMI 25.81 kg/m2  SpO2 99% Physical Exam  Constitutional: He is oriented to person, place, and time. He appears well-developed and well-nourished. No distress.  HENT:  Head: Normocephalic and atraumatic.  Mouth/Throat: Oropharynx is clear and moist.  Eyes: EOM are normal. Pupils are equal, round, and reactive to light.  Neck: Normal range of motion and phonation normal. Muscular tenderness present. No spinous process tenderness present. No rigidity. No erythema present. No Brudzinski's sign and no Kernig's sign noted. No thyromegaly present.  ttp of the right cervical paraspinal muscles and along the right trapezius muscle and scapular border.  Grip strength is strong and equal bilaterally.  Distal sensation intact,  CR < 2 sec.      Cardiovascular: Normal rate, regular rhythm, normal heart sounds and intact distal pulses.   No murmur heard. Pulmonary/Chest: Effort normal and breath sounds normal. No respiratory distress. He  exhibits no tenderness.  Musculoskeletal: He exhibits tenderness. He exhibits no edema.       Cervical back: He exhibits tenderness. He exhibits normal range of motion, no bony tenderness, no swelling, no deformity, no spasm and normal pulse.  Lymphadenopathy:    He has no cervical adenopathy.  Neurological: He is alert and oriented to person, place, and time. He has normal strength. No sensory deficit. He exhibits normal muscle tone. Coordination normal.  Reflex Scores:      Tricep reflexes are 2+ on the right  side and 2+ on the left side.      Bicep reflexes are 2+ on the right side and 2+ on the left side. Skin: Skin is warm and dry.  Nursing note and vitals reviewed.   ED Course  Procedures (including critical care time) Labs Review Labs Reviewed - No data to display  Imaging Review No results found.   EKG Interpretation None      MDM   Final diagnoses:  Cervical strain, acute, initial encounter    Patient with tenderness to the right cervical paraspinal muscles. No spinal tenderness or bony deformity. No focal neurological deficits on exam.  No hx of truam to indicate need for imaging at this time. Symptoms are likely musculoskeletal. I have discussed with patient the importance of follow-up with his primary care physician for further evaluation if symptoms are not improving. He appears stable for discharge.  Pauline Ausammy Ena Demary, PA-C 03/05/15 1732  Gilda Creasehristopher J Pollina, MD 03/07/15 [phone]

## 2015-03-04 NOTE — ED Notes (Signed)
PT states hx of neck pain and states agitation to neck and radiation to right shoulder and arm on 03/01/15 after using a chain saw and doing yard work.

## 2015-03-14 ENCOUNTER — Emergency Department
Admission: EM | Admit: 2015-03-14 | Discharge: 2015-03-14 | Disposition: A | Discharge status: Home / Self Care | Payer: Medicaid Other | Attending: Emergency Medicine | Admitting: Emergency Medicine

## 2015-03-14 ENCOUNTER — Encounter: Payer: Self-pay | Admitting: Emergency Medicine

## 2015-03-14 DIAGNOSIS — Y9389 Activity, other specified: Secondary | ICD-10-CM | POA: Diagnosis not present

## 2015-03-14 DIAGNOSIS — Y9289 Other specified places as the place of occurrence of the external cause: Secondary | ICD-10-CM | POA: Insufficient documentation

## 2015-03-14 DIAGNOSIS — Z720 Tobacco use: Secondary | ICD-10-CM | POA: Insufficient documentation

## 2015-03-14 DIAGNOSIS — S025XXA Fracture of tooth (traumatic), initial encounter for closed fracture: Secondary | ICD-10-CM | POA: Insufficient documentation

## 2015-03-14 DIAGNOSIS — Y998 Other external cause status: Secondary | ICD-10-CM | POA: Insufficient documentation

## 2015-03-14 DIAGNOSIS — W208XXA Other cause of strike by thrown, projected or falling object, initial encounter: Secondary | ICD-10-CM | POA: Insufficient documentation

## 2015-03-14 DIAGNOSIS — Z79899 Other long term (current) drug therapy: Secondary | ICD-10-CM | POA: Insufficient documentation

## 2015-03-14 DIAGNOSIS — S0993XA Unspecified injury of face, initial encounter: Secondary | ICD-10-CM | POA: Diagnosis present

## 2015-03-14 DIAGNOSIS — K0889 Other specified disorders of teeth and supporting structures: Secondary | ICD-10-CM

## 2015-03-14 MED ORDER — PENICILLIN V POTASSIUM 250 MG PO TABS: 250.0000 mg | ORAL_TABLET | Freq: Four times a day (QID) | ORAL | Status: DC

## 2015-03-14 MED ORDER — NAPROXEN 500 MG PO TABS: 500.0000 mg | ORAL_TABLET | Freq: Two times a day (BID) | ORAL | Status: DC

## 2015-03-14 MED ORDER — TRAMADOL HCL 50 MG PO TABS: 50.0000 mg | ORAL_TABLET | Freq: Four times a day (QID) | ORAL | Status: DC | PRN

## 2015-03-14 NOTE — Discharge Instructions (Signed)
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
## 2015-03-14 NOTE — ED Provider Notes (Signed)
[facility] Emergency Department Provider Note  ____________________________________________  Time seen: On arrival  I have reviewed the triage vital signs and the nursing notes.   HISTORY  Chief Complaint Dental Pain    HPI David Leach is a 39 y.o. male who presents with dental pain. He reports 3 days ago his son threw a wrench which broke his bottom tooth and it is hurting. No fevers chills. Does not have a dental appointment until next week. No throat pain or swelling.    Past Medical History  Diagnosis Date  . Asthma     There are no active problems to display for this patient.   Past Surgical History  Procedure Laterality Date  . Tonsillectomy      Current Outpatient Rx  Name  Route  Sig  Dispense  Refill  . albuterol (PROVENTIL HFA;VENTOLIN HFA) 108 (90 BASE) MCG/ACT inhaler   Inhalation   Inhale 2 puffs into the lungs every 4 (four) hours as needed for wheezing or shortness of breath.         . methocarbamol (ROBAXIN) 500 MG tablet   Oral   Take 1 tablet (500 mg total) by mouth 3 (three) times daily.   21 tablet   0   . naproxen (NAPROSYN) 500 MG tablet   Oral   Take 1 tablet (500 mg total) by mouth 2 (two) times daily with a meal.   20 tablet   2   . oxyCODONE-acetaminophen (PERCOCET/ROXICET) 5-325 MG per tablet   Oral   Take 1 tablet by mouth every 4 (four) hours as needed.   12 tablet   0   . penicillin v potassium (VEETID) 250 MG tablet   Oral   Take 1 tablet (250 mg total) by mouth 4 (four) times daily.   28 tablet   0   . SUMAtriptan (IMITREX) 50 MG tablet   Oral   Take 1 tablet (50 mg total) by mouth once as needed for migraine. May repeat in 2 hours if headache persists or recurs.   30 tablet   2   . traMADol (ULTRAM) 50 MG tablet   Oral   Take 1 tablet (50 mg total) by mouth every 6 (six) hours as needed.   20 tablet   0     Allergies Hydrocodone  Family History  Problem Relation Age of Onset   . Cancer Mother   . Diabetes Father   . Cancer Other   . Stroke Other   . Diabetes Other     Social History History  Substance Use Topics  . Smoking status: Current Every Day Smoker -- 0.50 packs/day for 20 years    Types: Cigarettes  . Smokeless tobacco: Former NeurosurgeonUser    Types: Chew  . Alcohol Use: Yes     Comment: occ    Review of Systems  Constitutional: Negative for fever. Eyes: Negative for visual changes. ENT: Negative for sore throat. Positive for dental pain    Skin: Negative for rash. Neurological: Negative for headaches or focal weakness   ____________________________________________   PHYSICAL EXAM:  VITAL SIGNS: ED Triage Vitals  Enc Vitals Group     BP 03/14/15 1325 139/91 mmHg     Pulse Rate 03/14/15 1325 104     Resp 03/14/15 1325 20     Temp 03/14/15 1325 98 F (36.7 C)     Temp Source 03/14/15 1325 Oral     SpO2 03/14/15 1325 100 %  Weight 03/14/15 1325 185 lb (83.915 kg)     Height 03/14/15 1325  (1.803 m)     Head Cir --      Peak Flow --      Pain Score 03/14/15 1326 8     Pain Loc --      Pain Edu? --      Excl. in GC? --      Constitutional: Alert and oriented. Well appearing and in no distress. Eyes: Conjunctivae are normal.  ENT   Head: Normocephalic and atraumatic.   Mouth/Throat: Mucous membranes are moist. Patient with only with a few teeth left in the lower anterior jaw. Broken molar but no erythema or abscess noted. Floor of mouth is normal Cardiovascular: Normal rate, regular rhythm.  Respiratory: Normal respiratory effort without tachypnea nor retractions.  Gastrointestinal: Soft and non-tender in all quadrants. No distention. There is no CVA tenderness. Musculoskeletal: Nontender with normal range of motion in all extremities. Neurologic:  Normal speech and language. No gross focal neurologic deficits are appreciated. Skin:  Skin is warm, dry and intact. No rash noted. Psychiatric: Mood and affect are  normal. Patient exhibits appropriate insight and judgment.  ____________________________________________    LABS (pertinent positives/negatives)  Labs Reviewed - No data to display  ____________________________________________     ____________________________________________    RADIOLOGY I have personally reviewed any xrays that were ordered on this patient: None  ____________________________________________   PROCEDURES  Procedure(s) performed: none   ____________________________________________   INITIAL IMPRESSION / ASSESSMENT AND PLAN / ED COURSE  Pertinent labs & imaging results that were available during my care of the patient were reviewed by me and considered in my medical decision making (see chart for details).  We will put patient on penicillin prescribed analgesic's until dental follow-up  ____________________________________________   FINAL CLINICAL IMPRESSION(S) / ED DIAGNOSES  Final diagnoses:  Pain, dental     Jene Every, MD 03/14/15 1401

## 2015-03-14 NOTE — ED Notes (Signed)
Pt informed to return if any life threatening symptoms occur.  

## 2015-11-15 ENCOUNTER — Encounter (HOSPITAL_COMMUNITY): Payer: Self-pay | Admitting: Emergency Medicine

## 2015-11-15 ENCOUNTER — Emergency Department (HOSPITAL_COMMUNITY)
Admission: EM | Admit: 2015-11-15 | Discharge: 2015-11-15 | Disposition: A | Discharge status: Home / Self Care | Payer: Medicaid Other | Attending: Emergency Medicine | Admitting: Emergency Medicine

## 2015-11-15 ENCOUNTER — Emergency Department (HOSPITAL_COMMUNITY): Payer: Medicaid Other

## 2015-11-15 DIAGNOSIS — F17210 Nicotine dependence, cigarettes, uncomplicated: Secondary | ICD-10-CM | POA: Diagnosis not present

## 2015-11-15 DIAGNOSIS — J45909 Unspecified asthma, uncomplicated: Secondary | ICD-10-CM | POA: Insufficient documentation

## 2015-11-15 DIAGNOSIS — M62830 Muscle spasm of back: Secondary | ICD-10-CM | POA: Diagnosis not present

## 2015-11-15 DIAGNOSIS — M25511 Pain in right shoulder: Secondary | ICD-10-CM

## 2015-11-15 DIAGNOSIS — G8929 Other chronic pain: Secondary | ICD-10-CM | POA: Diagnosis not present

## 2015-11-15 HISTORY — DX: Pain in unspecified shoulder: M25.519

## 2015-11-15 HISTORY — DX: Dorsalgia, unspecified: M54.9

## 2015-11-15 HISTORY — DX: Disorder of teeth and supporting structures, unspecified: K08.9

## 2015-11-15 HISTORY — DX: Headache, unspecified: R51.9

## 2015-11-15 HISTORY — DX: Headache: R51

## 2015-11-15 HISTORY — DX: Other chronic pain: G89.29

## 2015-11-15 MED ORDER — METHOCARBAMOL 500 MG PO TABS: 1000.0000 mg | ORAL_TABLET | Freq: Four times a day (QID) | ORAL | Status: AC

## 2015-11-15 MED ORDER — KETOROLAC TROMETHAMINE 60 MG/2ML IM SOLN
60.0000 mg | Freq: Once | INTRAMUSCULAR | Status: AC
  Administered 2015-11-15: 60 mg via INTRAMUSCULAR
  Filled 2015-11-15: qty 2

## 2015-11-15 MED ORDER — TRAMADOL HCL 50 MG PO TABS: 50.0000 mg | ORAL_TABLET | Freq: Four times a day (QID) | ORAL | Status: DC | PRN

## 2015-11-15 NOTE — ED Provider Notes (Signed)
CSN: [id number]     Arrival date & time 11/15/15  1443 History   First MD Initiated Contact with Patient 11/15/15 1610     Chief Complaint  Patient presents with  . Shoulder Pain     (Consider location/radiation/quality/duration/timing/severity/associated sxs/prior Treatment) The history is provided by the patient.   David Leach is a 40 y.o. male presenting with acute on chronic right shoulder and right upper back pain which has become severe over the past several days.  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  His pain is aching at rest, sharp with movement and also experiences a burning sensation across his upper back to his shoulder which is intermittent.  He denies weakness,numbness or radiation into his arms or hands.  He has taken ibuprofen 800 mg this am with mild improvement in his symptoms.     Past Medical History  Diagnosis Date  . Asthma   . Right-sided back pain   . Headache   . Chronic dental pain   . Chronic shoulder pain    Past Surgical History  Procedure Laterality Date  . Tonsillectomy     Family History  Problem Relation Age of Onset  . Cancer Mother   . Diabetes Father   . Cancer Other   . Stroke Other   . Diabetes Other    Social History  Substance Use Topics  . Smoking status: Current Every Day Smoker -- 1.00 packs/day for 20 years    Types: Cigarettes  . Smokeless tobacco: Former NeurosurgeonUser    Types: Chew  . Alcohol Use: Yes     Comment: occ    Review of Systems  Constitutional: Negative for fever.  Musculoskeletal: Positive for arthralgias. Negative for myalgias and joint swelling.  Neurological: Negative for weakness and numbness.      Allergies  Hydrocodone  Home Medications   Prior to Admission medications   Medication Sig Start Date End Date Taking? Authorizing Provider  albuterol (PROVENTIL  HFA;VENTOLIN HFA) 108 (90 BASE) MCG/ACT inhaler Inhale 2 puffs into the lungs every 4 (four) hours as needed for wheezing or shortness of breath.   Yes Historical Provider, MD  methocarbamol (ROBAXIN) 500 MG tablet Take 2 tablets (1,000 mg total) by mouth 4 (four) times daily. 11/15/15 11/25/15  [name] Calder Oblinger, PA-C  traMADol (ULTRAM) 50 MG tablet Take 1 tablet (50 mg total) by mouth every 6 (six) hours as needed. 11/15/15   [name] Kasen Sako, PA-C   BP 132/84 mmHg  Pulse 88  Temp(Src) 98.1 F (36.7 C) (Oral)  Resp 16  Ht 5\' 11"  (1.803 m)  Wt 81.647 kg  BMI 25.12 kg/m2  SpO2 100% Physical Exam  Constitutional: He appears well-developed and well-nourished.  HENT:  Head: Atraumatic.  Neck: Normal range of motion.  Cardiovascular:  Pulses equal bilaterally  Musculoskeletal: He exhibits tenderness.       Right shoulder: He exhibits tenderness. He exhibits normal range of motion, no bony tenderness, no spasm, normal pulse and normal strength.       Cervical back: He exhibits bony tenderness, pain and spasm. He exhibits no swelling and no edema.       Back:  ttp with palpation across mid c spine and across right upper trapezius with pain to the inferior ridge of the scapula.    Neurological: He is alert. He has normal strength. He displays normal  reflexes. No sensory deficit.  Equal grip strength.  Skin: Skin is warm and dry.  Psychiatric: He has a normal mood and affect.    ED Course  Procedures (including critical care time) Labs Review Labs Reviewed - No data to display  Imaging Review Dg Cervical Spine Complete  11/15/2015  CLINICAL DATA:  Pain and radiculopathy EXAM: CERVICAL SPINE - COMPLETE 4+ VIEW COMPARISON:  09/25/2014 FINDINGS: There is no evidence of cervical spine fracture or prevertebral soft tissue swelling. Alignment is normal. No other significant bone abnormalities are identified. IMPRESSION: Negative cervical spine radiographs. Electronically Signed   By: Norva Pavlov M.D.    On: 11/15/2015 17:25   Dg Shoulder Right  11/15/2015  CLINICAL DATA:  Right neck pain and radiculopathy EXAM: RIGHT SHOULDER - 2+ VIEW COMPARISON:  None. FINDINGS: There is no evidence of fracture or dislocation. There is no evidence of arthropathy or other focal bone abnormality. Soft tissues are unremarkable. IMPRESSION: Negative. Electronically Signed   By: Gerome Sam III M.D   On: 11/15/2015 17:24   I have personally reviewed and evaluated these images and lab results as part of my medical decision-making.   EKG Interpretation None      MDM   Final diagnoses:  Shoulder pain, right  Back muscle spasm    Referral to ortho given. Heat tx, robaxin, tramadol. Continue with ibuprofen.      Burgess Amor, PA-C 11/15/15 1743  Bethann Berkshire, MD 11/15/15 2239

## 2015-11-15 NOTE — Discharge Instructions (Signed)
Heat Therapy °Heat therapy can help ease sore, stiff, injured, and tight muscles and joints. Heat relaxes your muscles, which may help ease your pain.  °RISKS AND COMPLICATIONS °If you have any of the following conditions, do not use heat therapy unless your health care provider has approved: °· Poor circulation. °· Healing wounds or scarred skin in the area being treated. °· Diabetes, heart disease, or high blood pressure. °· Not being able to feel (numbness) the area being treated. °· Unusual swelling of the area being treated. °· Active infections. °· Blood clots. °· Cancer. °· Inability to communicate pain. This may include young children and people who have problems with their brain function (dementia). °· Pregnancy. °Heat therapy should only be used on old, pre-existing, or long-lasting (chronic) injuries. Do not use heat therapy on new injuries unless directed by your health care provider. °HOW TO USE HEAT THERAPY °There are several different kinds of heat therapy, including: °· Moist heat pack. °· Warm water bath. °· Hot water bottle. °· Electric heating pad. °· Heated gel pack. °· Heated wrap. °· Electric heating pad. °Use the heat therapy method suggested by your health care provider. Follow your health care provider's instructions on when and how to use heat therapy. °GENERAL HEAT THERAPY RECOMMENDATIONS °· Do not sleep while using heat therapy. Only use heat therapy while you are awake. °· Your skin may turn pink while using heat therapy. Do not use heat therapy if your skin turns red. °· Do not use heat therapy if you have new pain. °· High heat or long exposure to heat can cause burns. Be careful when using heat therapy to avoid burning your skin. °· Do not use heat therapy on areas of your skin that are already irritated, such as with a rash or sunburn. °SEEK MEDICAL CARE IF: °· You have blisters, redness, swelling, or numbness. °· You have new pain. °· Your pain is worse. °MAKE SURE  YOU: °· Understand these instructions. °· Will watch your condition. °· Will get help right away if you are not doing well or get worse. °  °This information is not intended to replace advice given to you by your health care provider. Make sure you discuss any questions you have with your health care provider. °  °Document Released: 11/08/2011 Document Revised: 09/06/2014 Document Reviewed: 10/09/2013 °Elsevier Interactive Patient Education ©2016 [employer] ° °Musculoskeletal Pain °Musculoskeletal pain is muscle and boney aches and pains. These pains can occur in any part of the body. Your caregiver may treat you without knowing the cause of the pain. They may treat you if blood or urine tests, X-rays, and other tests were normal.  °CAUSES °There is often not a definite cause or reason for these pains. These pains may be caused by a type of germ (virus). The discomfort may also come from overuse. Overuse includes working out too hard when your body is not fit. Boney aches also come from weather changes. Bone is sensitive to atmospheric pressure changes. °HOME CARE INSTRUCTIONS  °· Ask when your test results will be ready. Make sure you get your test results. °· Only take over-the-counter or prescription medicines for pain, discomfort, or fever as directed by your caregiver. If you were given medications for your condition, do not drive, operate machinery or power tools, or sign legal documents for 24 hours. Do not drink alcohol. Do not take sleeping pills or other medications that may interfere with treatment. °· Continue all activities unless the activities cause   more pain. When the pain lessens, slowly resume normal activities. Gradually increase the intensity and duration of the activities or exercise. °· During periods of severe pain, bed rest may be helpful. Lay or sit in any position that is comfortable. °· Putting ice on the injured area. °¨ Put ice in a bag. °¨ Place a towel between your skin and the  bag. °¨ Leave the ice on for 15 to 20 minutes, 3 to 4 times a day. °· Follow up with your caregiver for continued problems and no reason can be found for the pain. If the pain becomes worse or does not go away, it may be necessary to repeat tests or do additional testing. Your caregiver may need to look further for a possible cause. °SEEK IMMEDIATE MEDICAL CARE IF: °· You have pain that is getting worse and is not relieved by medications. °· You develop chest pain that is associated with shortness or breath, sweating, feeling sick to your stomach (nauseous), or throw up (vomit). °· Your pain becomes localized to the abdomen. °· You develop any new symptoms that seem different or that concern you. °MAKE SURE YOU:  °· Understand these instructions. °· Will watch your condition. °· Will get help right away if you are not doing well or get worse. °  °This information is not intended to replace advice given to you by your health care provider. Make sure you discuss any questions you have with your health care provider. °  °Document Released: 08/16/2005 Document Revised: 11/08/2011 Document Reviewed: 04/20/2013 °Elsevier Interactive Patient Education ©2016 [employer] ° °

## 2015-11-15 NOTE — ED Notes (Signed)
PT c/o right shoulder blade pain worsening x3 days picking up 5 gallon buckets of paint per pt. And worsening in pain with ROM of right shoulder.

## 2015-12-16 ENCOUNTER — Other Ambulatory Visit (HOSPITAL_COMMUNITY): Payer: Self-pay

## 2015-12-16 ENCOUNTER — Emergency Department
Admission: EM | Admit: 2015-12-16 | Discharge: 2015-12-16 | Disposition: A | Discharge status: Home / Self Care | Payer: Medicaid Other | Attending: Emergency Medicine | Admitting: Emergency Medicine

## 2015-12-16 ENCOUNTER — Other Ambulatory Visit: Payer: Self-pay

## 2015-12-16 ENCOUNTER — Encounter: Payer: Self-pay | Admitting: Emergency Medicine

## 2015-12-16 DIAGNOSIS — G43909 Migraine, unspecified, not intractable, without status migrainosus: Secondary | ICD-10-CM | POA: Insufficient documentation

## 2015-12-16 DIAGNOSIS — K0889 Other specified disorders of teeth and supporting structures: Secondary | ICD-10-CM | POA: Diagnosis present

## 2015-12-16 DIAGNOSIS — K029 Dental caries, unspecified: Secondary | ICD-10-CM | POA: Diagnosis not present

## 2015-12-16 DIAGNOSIS — F17210 Nicotine dependence, cigarettes, uncomplicated: Secondary | ICD-10-CM | POA: Insufficient documentation

## 2015-12-16 DIAGNOSIS — K047 Periapical abscess without sinus: Secondary | ICD-10-CM

## 2015-12-16 DIAGNOSIS — J45909 Unspecified asthma, uncomplicated: Secondary | ICD-10-CM | POA: Diagnosis not present

## 2015-12-16 DIAGNOSIS — G43809 Other migraine, not intractable, without status migrainosus: Secondary | ICD-10-CM

## 2015-12-16 MED ORDER — IBUPROFEN 800 MG PO TABS: 800.0000 mg | ORAL_TABLET | Freq: Three times a day (TID) | ORAL | Status: DC | PRN

## 2015-12-16 MED ORDER — AMOXICILLIN 500 MG PO TABS: 500.0000 mg | ORAL_TABLET | Freq: Two times a day (BID) | ORAL | Status: DC

## 2015-12-16 MED ORDER — ONDANSETRON 4 MG PO TBDP
4.0000 mg | ORAL_TABLET | Freq: Once | ORAL | Status: AC
  Administered 2015-12-16: 4 mg via ORAL

## 2015-12-16 MED ORDER — HYDROMORPHONE HCL 1 MG/ML IJ SOLN
1.0000 mg | Freq: Once | INTRAMUSCULAR | Status: AC
  Administered 2015-12-16: 1 mg via INTRAMUSCULAR
  Filled 2015-12-16: qty 1

## 2015-12-16 MED ORDER — ONDANSETRON 4 MG PO TBDP
ORAL_TABLET | ORAL | Status: AC
  Filled 2015-12-16: qty 1

## 2015-12-16 MED ORDER — ALBUTEROL SULFATE HFA 108 (90 BASE) MCG/ACT IN AERS: 2.0000 | INHALATION_SPRAY | Freq: Four times a day (QID) | RESPIRATORY_TRACT | Status: DC | PRN

## 2015-12-16 MED ORDER — ONDANSETRON HCL 4 MG/2ML IJ SOLN: 4.0000 mg | Freq: Once | INTRAMUSCULAR | Status: DC

## 2015-12-16 MED ORDER — OXYCODONE-ACETAMINOPHEN 5-325 MG PO TABS: 1.0000 | ORAL_TABLET | ORAL | Status: DC | PRN

## 2015-12-16 NOTE — ED Notes (Signed)
Reports getting 6 teeth pulled but not until 4 weeks from now.  Having dental pain and headache.

## 2015-12-16 NOTE — Discharge Instructions (Signed)
OPTIONS FOR DENTAL FOLLOW UP CARE ° °[place] [facility] Health and Human Services - Local Safety Net Dental Clinics °http://www.ncdhhs.gov/dph/oralhealth/services/safetynetclinics.htm °  °[facility] (336-562-3123) ° °[facility] (919-933-9087) ° °[facility] (919-663-1744 ext [phone]) ° °[facility] ([phone]) ° °[facility] ([phone]) °This clinic caters to the indigent population and is on a lottery system. °Location: °[facility], [address], [place] °Clinic Hours: °Wednesdays from 6pm - 9pm, patients seen by a lottery system. °For dates, call or go to [email or web address].[facility].edu/[facility]/patients/Dental-[facility] °Services: °Cleanings, fillings and simple extractions. °Payment Options: °DENTAL WORK IS FREE OF CHARGE. Bring proof of income or support. °Best way to get seen: °Arrive at 5:15 pm - this is a lottery, NOT first come/first serve, so arriving earlier will not increase your chances of being seen. °  °  °[facility] °[phone] °Select option 1 for emergencies °  °Location: °[facility], [address], [place] °Clinic Hours: °No walk-ins accepted - call the day before to schedule an appointment. °Check in times are 9:30 am and 1:30 pm. °Services: °Simple extractions, temporary fillings, pulpectomy/pulp debridement, uncomplicated abscess drainage. °Payment Options: °PAYMENT IS DUE AT THE TIME OF SERVICE.  Fee is usually $100-200, additional surgical procedures (e.g. abscess drainage) may be extra. °Cash, checks, Visa/MasterCard accepted.  Can file Medicaid if patient is covered for dental - patient should call case worker to check. °No discount for [facility] patients. °Best way to get seen: °MUST call the day before and get onto the schedule. Can usually be seen the next 1-2 days. No walk-ins accepted. °  °  °[facility] °[phone] °   °Location: °[facility], [address], [place] °Clinic Hours: °M, W, Th, F 8am or 1:30pm, Tues 9a or 1:30 - first come/first served. °Services: °Simple extractions, temporary fillings, uncomplicated abscess drainage.  You do not need to be an [place] resident. °Payment Options: °PAYMENT IS DUE AT THE TIME OF SERVICE. °Dental insurance, otherwise sliding scale - bring proof of income or support. °Depending on income and treatment needed, cost is usually $50-200. °Best way to get seen: °Arrive early as it is first come/first served. °  °  °[facility] °[phone] °  °Location: °[address] °Clinic Hours: °Mon-Thu 8a-5p °Services: °Most basic dental services including extractions and fillings. °Payment Options: °PAYMENT IS DUE AT THE TIME OF SERVICE. °Sliding scale, up to 50% off - bring proof if income or support. °Medicaid with dental option accepted. °Best way to get seen: °Call to schedule an appointment, can usually be seen within 2 weeks OR they will try to see walk-ins - show up at 8a or 2p (you may have to wait). °  °  °[facility] °[phone] °[place] RESIDENTS ONLY °  °Location: °[facility], [address], [place],  [address] °Clinic Hours: By appointment only. °Monday - Thursday 8am-5pm, Friday 8am-12pm °Services: Cleanings, fillings, extractions. °Payment Options: °PAYMENT IS DUE AT THE TIME OF SERVICE. °Cash, Visa or MasterCard. Sliding scale - $30 minimum per service. °Best way to get seen: °Come in to office, complete packet and make an appointment - need proof of income °or support monies for each household member and proof of [place] residence. °Usually takes about a month to get in. °  °  °[facility] °[phone] °  °Location: °[address]   [place] °Clinic Hours: Walk-in Urgent Care Dental Services are offered Monday-Friday  mornings only. °The numbers of emergencies accepted daily is limited to the number of °providers available. °Maximum 15 - Mondays, Wednesdays & Thursdays °Maximum 10 - Tuesdays & Fridays °Services: °You do not need to be a [place] resident to be seen for a dental emergency. °Emergencies are defined as pain, swelling, abnormal bleeding, or dental trauma. Walkins will receive x-rays if needed. °NOTE: Dental cleaning is not an emergency. °Payment Options: °PAYMENT IS DUE AT THE TIME OF SERVICE. °Minimum co-pay is $40.00 for uninsured patients. °Minimum co-pay is $3.00 for Medicaid with dental coverage. °Dental Insurance is accepted and must be presented at time of visit. °Medicare does not cover dental. °Forms of payment: Cash, credit card, checks. °Best way to get seen: °If not previously registered with the clinic, walk-in dental registration begins at 7:15 am and is on a first come/first serve basis. °If previously registered with the clinic, call to make an appointment. °  °  °[facility] °[phone] °[place] RESIDENTS ONLY °  °Location: °[address], [place], [place] °Clinic Hours: °Mon-Thu 10a-2p °Services: Extractions only! °Payment Options: °FREE (donations accepted) - bring proof of income or support °Best way to get seen: °Call and schedule an appointment OR come at 8am on the 1st Monday of every month (except for holidays) when it is first come/first served. °  °  °[facility] °[phone] °  °Location: °[address], [place] °Clinic Hours: °Friday mornings °Services, Payment Options, Best way to get seen: °Call for info °Dental Caries °Dental caries (also called tooth decay) is the most common oral disease. It can occur at any age but is more common in children and young adults.  °HOW DENTAL CARIES DEVELOPS  °The process of decay begins when bacteria and foods (particularly sugars and starches) combine in your mouth to produce plaque. Plaque is a substance that sticks to the  hard, outer surface of a tooth (enamel). The bacteria in plaque produce acids that attack enamel. These acids may also attack the root surface of a tooth (cementum) if it is exposed. Repeated attacks dissolve these surfaces and create holes in the tooth (cavities). If left untreated, the acids destroy the other layers of the tooth.  °RISK FACTORS °· Frequent sipping of sugary beverages.   °· Frequent snacking on sugary and starchy foods, especially those that easily get stuck in the teeth.   °· Poor oral hygiene.   °· Dry mouth.   °· Substance abuse such as methamphetamine abuse.   °· Broken or poor-fitting dental restorations.   °· Eating disorders.   °· Gastroesophageal reflux disease (GERD).   °· Certain radiation treatments to the head and neck. °SYMPTOMS °In the early stages of dental caries, symptoms are seldom present. Sometimes white, chalky areas may be seen on the enamel or other tooth layers. In later stages, symptoms may include: °· Pits and holes on the enamel. °· Toothache after sweet, hot, or cold foods or drinks are consumed. °· Pain around the tooth. °· Swelling around the tooth. °DIAGNOSIS  °Most of the time, dental caries is detected during a regular dental checkup. A diagnosis is made after a thorough medical and dental history is taken and the surfaces of your teeth are checked for signs of dental caries. Sometimes special instruments, such as lasers, are used to check for dental caries. Dental X-ray exams may be taken so that areas not visible to the eye (such as between the contact areas of the   teeth) can be checked for cavities.  TREATMENT  If dental caries is in its early stages, it may be reversed with a fluoride treatment or an application of a remineralizing agent at the dental office. Thorough brushing and flossing at home is needed to aid these treatments. If it is in its later stages, treatment depends on the location and extent of tooth destruction:   If a small area of the tooth  has been destroyed, the destroyed area will be removed and cavities will be filled with a material such as gold, silver amalgam, or composite resin.   If a large area of the tooth has been destroyed, the destroyed area will be removed and a cap (crown) will be fitted over the remaining tooth structure.   If the center part of the tooth (pulp) is affected, a procedure called a root canal will be needed before a filling or crown can be placed.   If most of the tooth has been destroyed, the tooth may need to be pulled (extracted). HOME CARE INSTRUCTIONS You can prevent, stop, or reverse dental caries at home by practicing good oral hygiene. Good oral hygiene includes:  Thoroughly cleaning your teeth at least twice a day with a toothbrush and dental floss.   Using a fluoride toothpaste. A fluoride mouth rinse may also be used if recommended by your dentist or health care provider.   Restricting the amount of sugary and starchy foods and sugary liquids you consume.   Avoiding frequent snacking on these foods and sipping of these liquids.   Keeping regular visits with a dentist for checkups and cleanings. PREVENTION   Practice good oral hygiene.  Consider a dental sealant. A dental sealant is a coating material that is applied by your dentist to the pits and grooves of teeth. The sealant prevents food from being trapped in them. It may protect the teeth for several years.  Ask about fluoride supplements if you live in a community without fluorinated water or with water that has a low fluoride content. Use fluoride supplements as directed by your dentist or health care provider.  Allow fluoride varnish applications to teeth if directed by your dentist or health care provider.   This information is not intended to replace advice given to you by your health care provider. Make sure you discuss any questions you have with your health care provider.   Document Released: 05/08/2002 Document  Revised: 09/06/2014 Document Reviewed: 08/18/2012 Elsevier Interactive Patient Education 2016 [employer].  Migraine Headache A migraine headache is an intense, throbbing pain on one or both sides of your head. A migraine can last for 30 minutes to several hours. CAUSES  The exact cause of a migraine headache is not always known. However, a migraine may be caused when nerves in the brain become irritated and release chemicals that cause inflammation. This causes pain. Certain things may also trigger migraines, such as:  Alcohol.  Smoking.  Stress.  Menstruation.  Aged cheeses.  Foods or drinks that contain nitrates, glutamate, aspartame, or tyramine.  Lack of sleep.  Chocolate.  Caffeine.  Hunger.  Physical exertion.  Fatigue.  Medicines used to treat chest pain (nitroglycerine), birth control pills, estrogen, and some blood pressure medicines. SIGNS AND SYMPTOMS  Pain on one or both sides of your head.  Pulsating or throbbing pain.  Severe pain that prevents daily activities.  Pain that is aggravated by any physical activity.  Nausea, vomiting, or both.  Dizziness.  Pain with exposure to  bright lights, loud noises, or activity.  General sensitivity to bright lights, loud noises, or smells. Before you get a migraine, you may get warning signs that a migraine is coming (aura). An aura may include:  Seeing flashing lights.  Seeing bright spots, halos, or zigzag lines.  Having tunnel vision or blurred vision.  Having feelings of numbness or tingling.  Having trouble talking.  Having muscle weakness. DIAGNOSIS  A migraine headache is often diagnosed based on:  Symptoms.  Physical exam.  A CT scan or MRI of your head. These imaging tests cannot diagnose migraines, but they can help rule out other causes of headaches. TREATMENT Medicines may be given for pain and nausea. Medicines can also be given to help prevent recurrent migraines.  HOME CARE  INSTRUCTIONS  Only take over-the-counter or prescription medicines for pain or discomfort as directed by your health care provider. The use of long-term narcotics is not recommended.  Lie down in a dark, quiet room when you have a migraine.  Keep a journal to find out what may trigger your migraine headaches. For example, write down:  What you eat and drink.  How much sleep you get.  Any change to your diet or medicines.  Limit alcohol consumption.  Quit smoking if you smoke.  Get 7-9 hours of sleep, or as recommended by your health care provider.  Limit stress.  Keep lights dim if bright lights bother you and make your migraines worse. SEEK IMMEDIATE MEDICAL CARE IF:   Your migraine becomes severe.  You have a fever.  You have a stiff neck.  You have vision loss.  You have muscular weakness or loss of muscle control.  You start losing your balance or have trouble walking.  You feel faint or pass out.  You have severe symptoms that are different from your first symptoms. MAKE SURE YOU:   Understand these instructions.  Will watch your condition.  Will get help right away if you are not doing well or get worse.   This information is not intended to replace advice given to you by your health care provider. Make sure you discuss any questions you have with your health care provider.   Document Released: 08/16/2005 Document Revised: 09/06/2014 Document Reviewed: 04/23/2013 Elsevier Interactive Patient Education [employer]2016 Elsevier Inc.

## 2015-12-16 NOTE — ED Provider Notes (Signed)
[facility] Emergency Department Provider Note  ____________________________________________  Time seen: Approximately 10:25 AM  I have reviewed the triage vital signs and the nursing notes.   HISTORY  Chief Complaint Dental Pain    HPI David Leach is a 40 y.o. male who presents with multiple complaints. First complaint is got severe dental pain with an abscess in his lower incisors. Patient has 6 remaining teeth both on the lower front with erythema and tenderness noted around the gums. Multiple dental caries on all the teeth noted. In addition patient complains of an acute exacerbation of migraine headache. Patient states last migraine was about a month ago. Describes his pain as 10 over 10 with photophobia and phonophobia. Nonradiating. Mild nausea no vomiting. This is not considered to worst headache of his life. A dental appointment coming up in another couple of weeks.   Past Medical History  Diagnosis Date  . Asthma   . Right-sided back pain   . Headache   . Chronic dental pain   . Chronic shoulder pain     There are no active problems to display for this patient.   Past Surgical History  Procedure Laterality Date  . Tonsillectomy      Current Outpatient Rx  Name  Route  Sig  Dispense  Refill  . albuterol (PROVENTIL HFA;VENTOLIN HFA) 108 (90 Base) MCG/ACT inhaler   Inhalation   Inhale 2 puffs into the lungs every 6 (six) hours as needed for wheezing or shortness of breath.   1 Inhaler   2   . amoxicillin (AMOXIL) 500 MG tablet   Oral   Take 1 tablet (500 mg total) by mouth 2 (two) times daily.   20 tablet   0   . ibuprofen (ADVIL,MOTRIN) 800 MG tablet   Oral   Take 1 tablet (800 mg total) by mouth every 8 (eight) hours as needed.   30 tablet   0   . oxyCODONE-acetaminophen (ROXICET) 5-325 MG tablet   Oral   Take 1-2 tablets by mouth every 4 (four) hours as needed for severe pain.   15 tablet   0      Allergies Hydrocodone  Family History  Problem Relation Age of Onset  . Cancer Mother   . Diabetes Father   . Cancer Other   . Stroke Other   . Diabetes Other     Social History Social History  Substance Use Topics  . Smoking status: Current Every Day Smoker -- 1.00 packs/day for 20 years    Types: Cigarettes  . Smokeless tobacco: Former NeurosurgeonUser    Types: Chew  . Alcohol Use: Yes     Comment: occ    Review of Systems Constitutional: No fever/chills Eyes: No visual changes. ENT: Positive for dental pain. Positive for migraine headache Cardiovascular: Denies chest pain. Respiratory: Denies shortness of breath. Gastrointestinal: No abdominal pain.  Mild nausea, no vomiting.  No diarrhea.  No constipation. Genitourinary: Negative for dysuria. Musculoskeletal: Negative for back pain. Skin: Negative for rash. Neurological: Positive for headaches, negative for focal weakness or numbness.  10-point ROS otherwise negative.  ____________________________________________   PHYSICAL EXAM:  VITAL SIGNS: ED Triage Vitals  Enc Vitals Group     BP 12/16/15 0923 135/81 mmHg     Pulse Rate 12/16/15 0923 88     Resp 12/16/15 0923 18     Temp 12/16/15 0923 98.2 F (36.8 C)     Temp Source 12/16/15 0923 Oral  SpO2 12/16/15 0923 99 %     Weight 12/16/15 0923 175 lb (79.379 kg)     Height 12/16/15 0923  (1.803 m)     Head Cir --      Peak Flow --      Pain Score 12/16/15 0923 9     Pain Loc --      Pain Edu? --      Excl. in GC? --     Constitutional: Alert and oriented. Well appearing and in no acute distress. Eyes: Conjunctivae are normal. PERRL. EOMI. Head: Atraumatic. Nose: No congestion/rhinnorhea. Mouth/Throat: Mucous membranes are moist.  Oropharynx non-erythematous.Multiple dental caries noted to all 6 remaining lower front teeth. Gums are erythematous and swollen Neck: No stridor.   Cardiovascular: Normal rate, regular rhythm. Grossly normal heart  sounds.  Good peripheral circulation. Respiratory: Normal respiratory effort.  No retractions. Lungs CTAB. Gastrointestinal: Soft and nontender. No distention. No CVA tenderness. Musculoskeletal: No lower extremity tenderness nor edema.  No joint effusions. Neurologic:  Normal speech and language. No gross focal neurologic deficits are appreciated. No gait instability. Skin:  Skin is warm, dry and intact. No rash noted. Psychiatric: Mood and affect are normal. Speech and behavior are normal.  ____________________________________________   LABS (all labs ordered are listed, but only abnormal results are displayed)  Labs Reviewed - No data to display ____________________________________________     PROCEDURES  Procedure(s) performed: None  Critical Care performed: No  ____________________________________________   INITIAL IMPRESSION / ASSESSMENT AND PLAN / ED COURSE  Pertinent labs & imaging results that were available during my care of the patient were reviewed by me and considered in my medical decision making (see chart for details).  1. Acute dental caries. 2. Acute migraine headache exacerbation. Rx given for Dilaudid 1 mg IM while in the ED and Zofran 4 mg. Patient discharged home on amoxicillin and ibuprofen. Refills given for albuterol inhaler at patient's request. Patient was given #8 Percocet. Patient follow-up PCP or return to the ER as needed. ____________________________________________   FINAL CLINICAL IMPRESSION(S) / ED DIAGNOSES  Final diagnoses:  Migraine variant  Infected dental carries     This chart was dictated using voice recognition software/Dragon. Despite best efforts to proofread, errors can occur which can change the meaning. Any change was purely unintentional.   Evangeline Dakin, PA-C 12/16/15 1125  Myrna Blazer, MD 12/16/15 [phone]

## 2015-12-16 NOTE — ED Notes (Signed)
States he has several teeth that are bad and need to be extracted.  Has dental appt in 3 weeks  But now having increased pain and headache

## 2015-12-27 ENCOUNTER — Encounter: Payer: Self-pay | Admitting: Emergency Medicine

## 2015-12-27 ENCOUNTER — Emergency Department
Admission: EM | Admit: 2015-12-27 | Discharge: 2015-12-27 | Disposition: A | Discharge status: Home / Self Care | Payer: Medicaid Other | Attending: Emergency Medicine | Admitting: Emergency Medicine

## 2015-12-27 DIAGNOSIS — J45909 Unspecified asthma, uncomplicated: Secondary | ICD-10-CM | POA: Diagnosis not present

## 2015-12-27 DIAGNOSIS — R519 Headache, unspecified: Secondary | ICD-10-CM

## 2015-12-27 DIAGNOSIS — F17210 Nicotine dependence, cigarettes, uncomplicated: Secondary | ICD-10-CM | POA: Insufficient documentation

## 2015-12-27 DIAGNOSIS — Z79899 Other long term (current) drug therapy: Secondary | ICD-10-CM | POA: Diagnosis not present

## 2015-12-27 DIAGNOSIS — K0889 Other specified disorders of teeth and supporting structures: Secondary | ICD-10-CM | POA: Insufficient documentation

## 2015-12-27 DIAGNOSIS — R51 Headache: Secondary | ICD-10-CM

## 2015-12-27 MED ORDER — NAPROXEN 500 MG PO TABS: 500.0000 mg | ORAL_TABLET | Freq: Two times a day (BID) | ORAL | Status: DC

## 2015-12-27 NOTE — ED Provider Notes (Signed)
[facility] Emergency Department Provider Note ____________________________________________  Time seen: Approximately 5:46 PM  I have reviewed the triage vital signs and the nursing notes.   HISTORY  Chief Complaint Dental Pain   HPI David Leach is a 40 y.o. male who presents to the emergency department for a prescription for Percocet. He states that he had 6 teeth pulled a few days ago and is having intense pain. He states that prior to leaving the dentist's office, he told them that he was going to have to have Percocet. He states that they tried to only give him ibuprofen, but family talked him into giving him a prescription for Vicodin. He states he took the prescription even though he knew it would cause itching. He states that it did in fact cause itching and he is having pain in his mouth. He also states that he is having migraine headaches that are triggered by the pain in his jaw.  Past Medical History  Diagnosis Date  . Asthma   . Right-sided back pain   . Headache   . Chronic dental pain   . Chronic shoulder pain     There are no active problems to display for this patient.   Past Surgical History  Procedure Laterality Date  . Tonsillectomy      Current Outpatient Rx  Name  Route  Sig  Dispense  Refill  . albuterol (PROVENTIL HFA;VENTOLIN HFA) 108 (90 Base) MCG/ACT inhaler   Inhalation   Inhale 2 puffs into the lungs every 6 (six) hours as needed for wheezing or shortness of breath.   1 Inhaler   2   . amoxicillin (AMOXIL) 500 MG tablet   Oral   Take 1 tablet (500 mg total) by mouth 2 (two) times daily.   20 tablet   0   . naproxen (NAPROSYN) 500 MG tablet   Oral   Take 1 tablet (500 mg total) by mouth 2 (two) times daily with a meal.   60 tablet   0   . oxyCODONE-acetaminophen (ROXICET) 5-325 MG tablet   Oral   Take 1-2 tablets by mouth every 4 (four) hours as needed for severe pain.   15 tablet   0      Allergies Hydrocodone  Family History  Problem Relation Age of Onset  . Cancer Mother   . Diabetes Father   . Cancer Other   . Stroke Other   . Diabetes Other     Social History Social History  Substance Use Topics  . Smoking status: Current Every Day Smoker -- 1.00 packs/day for 20 years    Types: Cigarettes  . Smokeless tobacco: Former Neurosurgeon    Types: Chew  . Alcohol Use: Yes     Comment: occ    Review of Systems Constitutional: No fever/chills.  ENT: Positive for dental pain Gastrointestinal: No nausea, no vomiting.  Skin: Negative for swelling or bleeding gums. Neurological: Positive for headaches, negative for focal weakness or numbness ___________________________________________   PHYSICAL EXAM:  VITAL SIGNS: ED Triage Vitals  Enc Vitals Group     BP 12/27/15 1702 132/73 mmHg     Pulse Rate 12/27/15 1702 94     Resp 12/27/15 1702 20     Temp 12/27/15 1702 99.6 F (37.6 C)     Temp Source 12/27/15 1702 Oral     SpO2 12/27/15 1702 97 %     Weight 12/27/15 1702 175 lb (79.379 kg)     Height  12/27/15 1702 5\' 11"  (1.803 m)     Head Cir --      Peak Flow --      Pain Score --      Pain Loc --      Pain Edu? --      Excl. in GC? --     Constitutional: Alert and oriented. Well appearing and in no acute distress. Eyes: Conjunctivae are normal. EOMI. Head: Atraumatic. Mouth/Throat: Mucous membranes are moist.  Oropharynx non-erythematous. Periodontal Exam: No gingival edema or dry sockets identified. No abscess noted. No facial swelling or trismus. Neck: No stridor.  Hematological/Lymphatic/Immunilogical: No cervical lymphadenopathy. Respiratory: Normal respiratory effort.  No retractions. Musculoskeletal: Full ROM x 4 extremities. Neurologic:  Normal speech and language. No gross focal neurologic deficits are appreciated. Speech is normal. No gait instability. No cranial nerve deficits identified.  Skin:  Skin is warm, dry and  intact. ____________________________________________   LABS (all labs ordered are listed, but only abnormal results are displayed)  Labs Reviewed - No data to display ____________________________________________   RADIOLOGY  Not indicated. ____________________________________________   PROCEDURES  Procedure(s) performed: None  Critical Care performed: No  ____________________________________________   INITIAL IMPRESSION / ASSESSMENT AND PLAN / ED COURSE  Pertinent labs & imaging results that were available during my care of the patient were reviewed by me and considered in my medical decision making (see chart for details).  Patient was advised he will need to consult with his dentist for pain that does not respond to Naprosyn. He was advised to follow up with the PCP of his choice for headaches that do not respond to naprosyn as well. Patient became angry and upset that he would not receive Percocet prescription today. He left the department speaking very loudly about his displeasure and his demeanor of dental pain and headache was no longer detectable.Included in his discharge instructions were community resources for primary care and medication management.  ____________________________________________   FINAL CLINICAL IMPRESSION(S) / ED DIAGNOSES  Final diagnoses:  Pain, dental  Nonintractable episodic headache, unspecified headache type       Chinita PesterCari B Kazzandra Desaulniers, FNP 12/27/15 1906  Jene Everyobert Kinner, MD 12/27/15 2004

## 2015-12-27 NOTE — Discharge Instructions (Signed)
If you need stronger pain medication, you will need to call your dentist. Follow up with the primary care provider of your choice for recurrent headaches.

## 2015-12-27 NOTE — ED Notes (Signed)
Reports 6 teeth pulled, states they gave him vicodin for pain but he is allergic to it.  States the only thing that works is Microbiologistpercocet.

## 2015-12-27 NOTE — ED Notes (Signed)
Pt in room agitated, stomping feet on floor. States "This is crazy, that woman [NP] is crazy. [facility] a [facility]." Pt given prescription for pain medication, left with discharge instructions and prescription. Pt refused to have vital signs rechecked. Pt stable at discharge, ambulatory, A&Ox4.

## 2016-01-31 ENCOUNTER — Emergency Department: Payer: Medicaid Other

## 2016-01-31 ENCOUNTER — Emergency Department
Admission: EM | Admit: 2016-01-31 | Discharge: 2016-01-31 | Disposition: A | Discharge status: Home / Self Care | Payer: Medicaid Other | Attending: Emergency Medicine | Admitting: Emergency Medicine

## 2016-01-31 ENCOUNTER — Encounter: Payer: Self-pay | Admitting: *Deleted

## 2016-01-31 DIAGNOSIS — Y9389 Activity, other specified: Secondary | ICD-10-CM | POA: Diagnosis not present

## 2016-01-31 DIAGNOSIS — J45909 Unspecified asthma, uncomplicated: Secondary | ICD-10-CM | POA: Diagnosis not present

## 2016-01-31 DIAGNOSIS — F17210 Nicotine dependence, cigarettes, uncomplicated: Secondary | ICD-10-CM | POA: Insufficient documentation

## 2016-01-31 DIAGNOSIS — X58XXXA Exposure to other specified factors, initial encounter: Secondary | ICD-10-CM | POA: Diagnosis not present

## 2016-01-31 DIAGNOSIS — S0101XA Laceration without foreign body of scalp, initial encounter: Secondary | ICD-10-CM | POA: Insufficient documentation

## 2016-01-31 DIAGNOSIS — Y990 Civilian activity done for income or pay: Secondary | ICD-10-CM | POA: Insufficient documentation

## 2016-01-31 DIAGNOSIS — F17290 Nicotine dependence, other tobacco product, uncomplicated: Secondary | ICD-10-CM | POA: Diagnosis not present

## 2016-01-31 DIAGNOSIS — F1410 Cocaine abuse, uncomplicated: Secondary | ICD-10-CM | POA: Diagnosis not present

## 2016-01-31 DIAGNOSIS — Y92511 Restaurant or cafe as the place of occurrence of the external cause: Secondary | ICD-10-CM | POA: Diagnosis not present

## 2016-01-31 DIAGNOSIS — R55 Syncope and collapse: Secondary | ICD-10-CM | POA: Diagnosis present

## 2016-01-31 LAB — CBC WITH DIFFERENTIAL/PLATELET
Basophils Absolute: 0.1 10*3/uL (ref 0–0.1)
Basophils Relative: 1 %
EOS PCT: 3 %
Eosinophils Absolute: 0.3 10*3/uL (ref 0–0.7)
HCT: 40.4 % (ref 40.0–52.0)
HEMOGLOBIN: 13.7 g/dL (ref 13.0–18.0)
LYMPHS ABS: 2.8 10*3/uL (ref 1.0–3.6)
LYMPHS PCT: 31 %
MCH: 30.2 pg (ref 26.0–34.0)
MCHC: 33.9 g/dL (ref 32.0–36.0)
MCV: 88.9 fL (ref 80.0–100.0)
Monocytes Absolute: 1 10*3/uL (ref 0.2–1.0)
Monocytes Relative: 11 %
Neutro Abs: 5 10*3/uL (ref 1.4–6.5)
Neutrophils Relative %: 54 %
PLATELETS: 207 10*3/uL (ref 150–440)
RBC: 4.54 MIL/uL (ref 4.40–5.90)
RDW: 14.4 % (ref 11.5–14.5)
WBC: 9.2 10*3/uL (ref 3.8–10.6)

## 2016-01-31 LAB — URINE DRUG SCREEN, QUALITATIVE (ARMC ONLY)
AMPHETAMINES, UR SCREEN: NOT DETECTED
Barbiturates, Ur Screen: NOT DETECTED
Benzodiazepine, Ur Scrn: NOT DETECTED
Cannabinoid 50 Ng, Ur ~~LOC~~: NOT DETECTED
Cocaine Metabolite,Ur ~~LOC~~: POSITIVE — AB
MDMA (ECSTASY) UR SCREEN: NOT DETECTED
METHADONE SCREEN, URINE: NOT DETECTED
OPIATE, UR SCREEN: NOT DETECTED
PHENCYCLIDINE (PCP) UR S: NOT DETECTED
Tricyclic, Ur Screen: NOT DETECTED

## 2016-01-31 LAB — COMPREHENSIVE METABOLIC PANEL
ALK PHOS: 63 U/L (ref 38–126)
ALT: 43 U/L (ref 17–63)
AST: 190 U/L — ABNORMAL HIGH (ref 15–41)
Albumin: 3.6 g/dL (ref 3.5–5.0)
Anion gap: 9 (ref 5–15)
BILIRUBIN TOTAL: 0.6 mg/dL (ref 0.3–1.2)
BUN: 15 mg/dL (ref 6–20)
CALCIUM: 8.5 mg/dL — AB (ref 8.9–10.3)
CO2: 25 mmol/L (ref 22–32)
CREATININE: 1.23 mg/dL (ref 0.61–1.24)
Chloride: 104 mmol/L (ref 101–111)
Glucose, Bld: 82 mg/dL (ref 65–99)
Potassium: 4.6 mmol/L (ref 3.5–5.1)
Sodium: 138 mmol/L (ref 135–145)
Total Protein: 6.2 g/dL — ABNORMAL LOW (ref 6.5–8.1)

## 2016-01-31 LAB — URINALYSIS COMPLETE WITH MICROSCOPIC (ARMC ONLY)
BILIRUBIN URINE: NEGATIVE
Bacteria, UA: NONE SEEN
Glucose, UA: NEGATIVE mg/dL
Hgb urine dipstick: NEGATIVE
Leukocytes, UA: NEGATIVE
Nitrite: NEGATIVE
PROTEIN: NEGATIVE mg/dL
Specific Gravity, Urine: 1.017 (ref 1.005–1.030)
Squamous Epithelial / LPF: NONE SEEN
pH: 5 (ref 5.0–8.0)

## 2016-01-31 LAB — TROPONIN I

## 2016-01-31 MED ORDER — KETOROLAC TROMETHAMINE 30 MG/ML IJ SOLN
30.0000 mg | Freq: Once | INTRAMUSCULAR | Status: AC
  Administered 2016-01-31: 30 mg via INTRAVENOUS

## 2016-01-31 MED ORDER — LAMOTRIGINE 200 MG PO TABS: 400.0000 mg | ORAL_TABLET | Freq: Once | ORAL | Status: DC

## 2016-01-31 MED ORDER — OXYCODONE-ACETAMINOPHEN 5-325 MG PO TABS
2.0000 | ORAL_TABLET | Freq: Once | ORAL | Status: AC
  Administered 2016-01-31: 2 via ORAL
  Filled 2016-01-31: qty 2

## 2016-01-31 MED ORDER — SODIUM CHLORIDE 0.9 % IV SOLN
Freq: Once | INTRAVENOUS | Status: AC
  Administered 2016-01-31: 12:00:00 via INTRAVENOUS

## 2016-01-31 MED ORDER — LIDOCAINE-EPINEPHRINE (PF) 1 %-1:200000 IJ SOLN: 30.0000 mL | Freq: Once | INTRAMUSCULAR | Status: DC

## 2016-01-31 MED ORDER — TRAMADOL HCL 50 MG PO TABS: 50.0000 mg | ORAL_TABLET | Freq: Four times a day (QID) | ORAL | Status: DC | PRN

## 2016-01-31 MED ORDER — KETOROLAC TROMETHAMINE 30 MG/ML IJ SOLN
INTRAMUSCULAR | Status: AC
  Administered 2016-01-31: 30 mg via INTRAVENOUS
  Filled 2016-01-31: qty 1

## 2016-01-31 NOTE — Discharge Instructions (Signed)
Head Injury, Adult You have a head injury. Headaches and throwing up (vomiting) are common after a head injury. It should be easy to wake up from sleeping. Sometimes you must stay in the hospital. Most problems happen within the first 24 hours. Side effects may occur up to 7-10 days after the injury.  WHAT ARE THE TYPES OF HEAD INJURIES? Head injuries can be as minor as a bump. Some head injuries can be more severe. More severe head injuries include:  A jarring injury to the brain (concussion).  A bruise of the brain (contusion). This mean there is bleeding in the brain that can cause swelling.  A cracked skull (skull fracture).  Bleeding in the brain that collects, clots, and forms a bump (hematoma). WHEN SHOULD I GET HELP RIGHT AWAY?   You are confused or sleepy.  You cannot be woken up.  You feel sick to your stomach (nauseous) or keep throwing up (vomiting).  Your dizziness or unsteadiness is getting worse.  You have very bad, lasting headaches that are not helped by medicine. Take medicines only as told by your doctor.  You cannot use your arms or legs like normal.  You cannot walk.  You notice changes in the black spots in the center of the colored part of your eye (pupil).  You have clear or bloody fluid coming from your nose or ears.  You have trouble seeing. During the next 24 hours after the injury, you must stay with someone who can watch you. This person should get help right away (call 911 in the [place] if you start to shake and are not able to control it (have seizures), you pass out, or you are unable to wake up. HOW CAN I PREVENT A HEAD INJURY IN THE FUTURE?  Wear seat belts.  Wear a helmet while bike riding and playing sports like football.  Stay away from dangerous activities around the house. WHEN CAN I RETURN TO NORMAL ACTIVITIES AND ATHLETICS? See your doctor before doing these activities. You should not do normal activities or play contact sports until 1  week after the following symptoms have stopped:  Headache that does not go away.  Dizziness.  Poor attention.  Confusion.  Memory problems.  Sickness to your stomach or throwing up.  Tiredness.  Fussiness.  Bothered by bright lights or loud noises.  Anxiousness or depression.  Restless sleep. MAKE SURE YOU:   Understand these instructions.  Will watch your condition.  Will get help right away if you are not doing well or get worse.   This information is not intended to replace advice given to you by your health care provider. Make sure you discuss any questions you have with your health care provider.   Document Released: 07/29/2008 Document Revised: 09/06/2014 Document Reviewed: 04/23/2013 Elsevier Interactive Patient Education 2016 [place], Adult A laceration is a cut that goes through all of the layers of the skin and into the tissue that is right under the skin. Some lacerations heal on their own. Others need to be closed with stitches (sutures), staples, skin adhesive strips, or skin glue. Proper laceration care minimizes the risk of infection and helps the laceration to heal better. HOW TO CARE FOR YOUR LACERATION If sutures or staples were used:  Keep the wound clean and dry.  If you were given a bandage (dressing), you should change it at least one time per day or as told by your health care provider. You should also  change it if it becomes wet or dirty.  Keep the wound completely dry for the first 24 hours or as told by your health care provider. After that time, you may shower or bathe. However, make sure that the wound is not soaked in water until after the sutures or staples have been removed.  Clean the wound one time each day or as told by your health care provider:  Wash the wound with soap and water.  Rinse the wound with water to remove all soap.  Pat the wound dry with a clean towel. Do not rub the wound.  After cleaning  the wound, apply a thin layer of antibiotic ointmentas told by your health care provider. This will help to prevent infection and keep the dressing from sticking to the wound.  Have the sutures or staples removed as told by your health care provider. If skin adhesive strips were used:  Keep the wound clean and dry.  If you were given a bandage (dressing), you should change it at least one time per day or as told by your health care provider. You should also change it if it becomes dirty or wet.  Do not get the skin adhesive strips wet. You may shower or bathe, but be careful to keep the wound dry.  If the wound gets wet, pat it dry with a clean towel. Do not rub the wound.  Skin adhesive strips fall off on their own. You may trim the strips as the wound heals. Do not remove skin adhesive strips that are still stuck to the wound. They will fall off in time. If skin glue was used:  Try to keep the wound dry, but you may briefly wet it in the shower or bath. Do not soak the wound in water, such as by swimming.  After you have showered or bathed, gently pat the wound dry with a clean towel. Do not rub the wound.  Do not do any activities that will make you sweat heavily until the skin glue has fallen off on its own.  Do not apply liquid, cream, or ointment medicine to the wound while the skin glue is in place. Using those may loosen the film before the wound has healed.  If you were given a bandage (dressing), you should change it at least one time per day or as told by your health care provider. You should also change it if it becomes dirty or wet.  If a dressing is placed over the wound, be careful not to apply tape directly over the skin glue. Doing that may cause the glue to be pulled off before the wound has healed.  Do not pick at the glue. The skin glue usually remains in place for 5-10 days, then it falls off of the skin. General Instructions  Take over-the-counter and  prescription medicines only as told by your health care provider.  If you were prescribed an antibiotic medicine or ointment, take or apply it as told by your doctor. Do not stop using it even if your condition improves.  To help prevent scarring, make sure to cover your wound with sunscreen whenever you are outside after stitches are removed, after adhesive strips are removed, or when glue remains in place and the wound is healed. Make sure to wear a sunscreen of at least 30 SPF.  Do not scratch or pick at the wound.  Keep all follow-up visits as told by your health care provider. This is important.  Check your wound every day for signs of infection. Watch for:  Redness, swelling, or pain.  Fluid, blood, or pus.  Raise (elevate) the injured area above the level of your heart while you are sitting or lying down, if possible. SEEK MEDICAL CARE IF:  You received a tetanus shot and you have swelling, severe pain, redness, or bleeding at the injection site.  You have a fever.  A wound that was closed breaks open.  You notice a bad smell coming from your wound or your dressing.  You notice something coming out of the wound, such as wood or glass.  Your pain is not controlled with medicine.  You have increased redness, swelling, or pain at the site of your wound.  You have fluid, blood, or pus coming from your wound.  You notice a change in the color of your skin near your wound.  You need to change the dressing frequently due to fluid, blood, or pus draining from the wound.  You develop a new rash.  You develop numbness around the wound. SEEK IMMEDIATE MEDICAL CARE IF:  You develop severe swelling around the wound.  Your pain suddenly increases and is severe.  You develop painful lumps near the wound or on skin that is anywhere on your body.  You have a red streak going away from your wound.  The wound is on your hand or foot and you cannot properly move a finger or  toe.  The wound is on your hand or foot and you notice that your fingers or toes look pale or bluish.   This information is not intended to replace advice given to you by your health care provider. Make sure you discuss any questions you have with your health care provider.   Document Released: 08/16/2005 Document Revised: 12/31/2014 Document Reviewed: 08/12/2014 Elsevier Interactive Patient Education 2016 [employer]  Syncope Syncope means a person passes out (faints). The person usually wakes up in less than 5 minutes. It is important to seek medical care for syncope. HOME CARE  Have someone stay with you until you feel normal.  Do not drive, use machines, or play sports until your doctor says it is okay.  Keep all doctor visits as told.  Lie down when you feel like you might pass out. Take deep breaths. Wait until you feel normal before standing up.  Drink enough fluids to keep your pee (urine) clear or pale yellow.  If you take blood pressure or heart medicine, get up slowly. Take several minutes to sit and then stand. GET HELP RIGHT AWAY IF:   You have a severe headache.  You have pain in the chest, belly (abdomen), or back.  You are bleeding from the mouth or butt (rectum).  You have black or tarry poop (stool).  You have an irregular or very fast heartbeat.  You have pain with breathing.  You keep passing out, or you have shaking (seizures) when you pass out.  You pass out when sitting or lying down.  You feel confused.  You have trouble walking.  You have severe weakness.  You have vision problems. If you fainted, call for help (911 in [place] Do not drive yourself to the hospital.   This information is not intended to replace advice given to you by your health care provider. Make sure you discuss any questions you have with your health care provider.   Document Released: 02/02/2008 Document Revised: 12/31/2014 Document Reviewed: 10/15/2011 Elsevier  Interactive Patient Education  2016 Elsevier Inc. ° °

## 2016-01-31 NOTE — ED Notes (Signed)
Pt returned from Ct, resting in bed in no distress, mother at bedside

## 2016-01-31 NOTE — ED Notes (Signed)
Pt asking for more pain medication, MD made aware and no pain medicine ordered at this time, resting in bed, given water

## 2016-01-31 NOTE — ED Provider Notes (Signed)
[facility] Emergency Department Provider Note        Time seen: ----------------------------------------- 11:39 AM on 01/31/2016 -----------------------------------------    I have reviewed the triage vital signs and the nursing notes.   HISTORY  Chief Complaint Loss of Consciousness    HPI David Leach is a 40 y.o. male who presents to ER after having had a syncopal event while working at [employer]. Patient states he was working outside and subsequent he passed out. She reports she's been under a lot of stress is not eating well. He has not had anything to eat or drink today. Patient states he ate last night, he has had a syncopal event in the past from dehydration he states. He is complaining of headache and right jaw pain.   Past Medical History  Diagnosis Date  . Asthma   . Right-sided back pain   . Headache   . Chronic dental pain   . Chronic shoulder pain     There are no active problems to display for this patient.   Past Surgical History  Procedure Laterality Date  . Tonsillectomy      Allergies Hydrocodone  Social History Social History  Substance Use Topics  . Smoking status: Current Every Day Smoker -- 1.00 packs/day for 20 years    Types: Cigarettes  . Smokeless tobacco: Former NeurosurgeonUser    Types: Chew  . Alcohol Use: Yes     Comment: occ    Review of Systems Constitutional: Negative for fever. Eyes: Negative for visual changes. ENT: Negative for sore throat. Cardiovascular: Negative for chest pain. Respiratory: Negative for shortness of breath. Gastrointestinal: Negative for abdominal pain, vomiting and diarrhea. Genitourinary: Negative for dysuria. Musculoskeletal: Positive for right jaw pain Skin: Negative for rash. Neurological: Positive for headache Psychiatric: Positive for anxiety  10-point ROS otherwise negative.  ____________________________________________   PHYSICAL EXAM:  VITAL SIGNS: ED Triage  Vitals  Enc Vitals Group     BP 01/31/16 1132 125/95 mmHg     Pulse Rate 01/31/16 1132 99     Resp 01/31/16 1132 18     Temp 01/31/16 1132 98.6 F (37 C)     Temp Source 01/31/16 1132 Oral     SpO2 01/31/16 1129 100 %     Weight 01/31/16 1132 165 lb (74.844 kg)     Height 01/31/16 1132 5\' 11"  (1.803 m)     Head Cir --      Peak Flow --      Pain Score 01/31/16 1132 10     Pain Loc --      Pain Edu? --      Excl. in GC? --     Constitutional: Alert and oriented. No acute distress Eyes: Conjunctivae are normal. Chronic right pupil irregularity. Normal extraocular movements. ENT   Head: Midline parieto-occipital scalp laceration of 2 cm, scattered abrasions on the face   Nose: No congestion/rhinnorhea.   Mouth/Throat: Mucous membranes are moist.   Neck: No stridor. Cardiovascular: Normal rate, regular rhythm. No murmurs, rubs, or gallops. Respiratory: Normal respiratory effort without tachypnea nor retractions. Breath sounds are clear and equal bilaterally. No wheezes/rales/rhonchi. Gastrointestinal: Soft and nontender. Normal bowel sounds Musculoskeletal: Nontender with normal range of motion in all extremities. No lower extremity tenderness nor edema. Neurologic:  Normal speech and language. No gross focal neurologic deficits are appreciated.  Skin:  Skin is warm, dry and intact. No rash noted. Psychiatric: Mood and affect are normal. Speech and behavior are normal.  ____________________________________________  EKG: Interpreted by me.Sinus rhythm with a rate of 99 bpm, normal PR interval, normal QRS width, normal QT interval. Normal axis. Possible inferior infarct age and determinate  ____________________________________________  ED COURSE:  Pertinent labs & imaging results that were available during my care of the patient were reviewed by me and considered in my medical decision making (see chart for details). Patient present to the ER after syncope likely from  dehydration. Patient states he feels worse with standing. He will receive IV fluids, will require laceration repair.  LACERATION REPAIR Performed by: Emily Filbert Authorized by: Daryel November E Consent: Verbal consent obtained. Risks and benefits: risks, benefits and alternatives were discussed Consent given by: patient Patient identity confirmed: provided demographic data Prepped and Draped in normal sterile fashion Wound explored  Laceration Location: Posterior midline parietal scalp  Laceration Length: 3 cm  No Foreign Bodies seen or palpated  Anesthesia: None   Irrigation method: syringe Amount of cleaning: standard  Skin closure: Staples   Number of staples: 4  Patient tolerance: Patient tolerated the procedure well with no immediate complications. ____________________________________________    LABS (pertinent positives/negatives)  Labs Reviewed  COMPREHENSIVE METABOLIC PANEL - Abnormal; Notable for the following:    Calcium 8.5 (*)    Total Protein 6.2 (*)    AST 190 (*)    All other components within normal limits  URINALYSIS COMPLETEWITH MICROSCOPIC ([facility] ONLY) - Abnormal; Notable for the following:    Color, Urine YELLOW (*)    APPearance CLEAR (*)    Ketones, ur TRACE (*)    All other components within normal limits  URINE DRUG SCREEN, QUALITATIVE ([facility] ONLY) - Abnormal; Notable for the following:    Cocaine Metabolite,Ur [place] POSITIVE (*)    All other components within normal limits  CBC WITH DIFFERENTIAL/PLATELET  TROPONIN I    RADIOLOGY Images were viewed by me   CT head, maxillofacial FINDINGS: CT HEAD FINDINGS  No acute intracranial abnormality. Specifically, no hemorrhage, hydrocephalus, mass lesion, acute infarction, or significant intracranial injury. No acute calvarial abnormality.  CT MAXILLOFACIAL FINDINGS  Mucosal thickening in the maxillary sinuses. Air-fluid level within the left maxillary sinus. No evidence of  facial fracture or orbital fracture. Mandible and zygomatic arches are intact.  IMPRESSION: No intracranial abnormality.  No evidence of facial fracture. Acute on chronic sinusitis.  ____________________________________________  FINAL ASSESSMENT AND PLAN  Syncope, head injury, scalp laceration  Plan: Patient with labs and imaging as dictated above. Patient presents to ER after syncopal event, head injury requiring scalp laceration repair. Scan of his head and face are unremarkable, drug screen is positive for cocaine. Syncope today was likely multifactorial including dehydration and substance abuse. Patient is stable for outpatient follow-up.   Emily Filbert, MD   Note: This dictation was prepared with Dragon dictation. Any transcriptional errors that result from this process are unintentional   Emily Filbert, MD 01/31/16 1428

## 2016-01-31 NOTE — ED Notes (Signed)
Pt arrives via EMS from work at [employer], states he was working outside and then had a syncopal episode when he came inside, states he has been under a lot of stress recently and not eating well, arrives with laceration to back of head from the syncopal episode, arrives awake and alert, states right jaw pain and a headache

## 2016-01-31 NOTE — ED Notes (Signed)
Patient transported to CT 

## 2016-02-05 ENCOUNTER — Emergency Department
Admission: EM | Admit: 2016-02-05 | Discharge: 2016-02-05 | Disposition: A | Discharge status: Home / Self Care | Payer: Medicaid Other | Attending: Emergency Medicine | Admitting: Emergency Medicine

## 2016-02-05 DIAGNOSIS — F17210 Nicotine dependence, cigarettes, uncomplicated: Secondary | ICD-10-CM | POA: Diagnosis not present

## 2016-02-05 DIAGNOSIS — J45909 Unspecified asthma, uncomplicated: Secondary | ICD-10-CM | POA: Diagnosis not present

## 2016-02-05 DIAGNOSIS — Z7951 Long term (current) use of inhaled steroids: Secondary | ICD-10-CM | POA: Diagnosis not present

## 2016-02-05 DIAGNOSIS — M62838 Other muscle spasm: Secondary | ICD-10-CM

## 2016-02-05 DIAGNOSIS — G44319 Acute post-traumatic headache, not intractable: Secondary | ICD-10-CM | POA: Diagnosis not present

## 2016-02-05 DIAGNOSIS — G43909 Migraine, unspecified, not intractable, without status migrainosus: Secondary | ICD-10-CM | POA: Diagnosis present

## 2016-02-05 MED ORDER — METHOCARBAMOL 500 MG PO TABS: 500.0000 mg | ORAL_TABLET | Freq: Four times a day (QID) | ORAL | Status: DC

## 2016-02-05 MED ORDER — DIPHENHYDRAMINE HCL 50 MG/ML IJ SOLN
50.0000 mg | Freq: Once | INTRAMUSCULAR | Status: AC
  Administered 2016-02-05: 50 mg via INTRAMUSCULAR
  Filled 2016-02-05: qty 1

## 2016-02-05 MED ORDER — DIPHENHYDRAMINE HCL 50 MG/ML IJ SOLN
INTRAMUSCULAR | Status: AC
  Administered 2016-02-05: 50 mg via INTRAMUSCULAR
  Filled 2016-02-05: qty 1

## 2016-02-05 MED ORDER — KETOROLAC TROMETHAMINE 10 MG PO TABS
10.0000 mg | ORAL_TABLET | Freq: Four times a day (QID) | ORAL | Status: DC | PRN
Start: 2016-02-05 — End: 2016-09-06

## 2016-02-05 MED ORDER — KETOROLAC TROMETHAMINE 30 MG/ML IJ SOLN
30.0000 mg | Freq: Once | INTRAMUSCULAR | Status: AC
  Administered 2016-02-05: 30 mg via INTRAMUSCULAR
  Filled 2016-02-05: qty 1

## 2016-02-05 MED ORDER — KETOROLAC TROMETHAMINE 30 MG/ML IJ SOLN
INTRAMUSCULAR | Status: AC
  Administered 2016-02-05: 30 mg via INTRAMUSCULAR
  Filled 2016-02-05: qty 1

## 2016-02-05 MED ORDER — PROCHLORPERAZINE EDISYLATE 5 MG/ML IJ SOLN
10.0000 mg | Freq: Once | INTRAMUSCULAR | Status: AC
  Administered 2016-02-05: 10 mg via INTRAMUSCULAR
  Filled 2016-02-05: qty 2

## 2016-02-05 MED ORDER — PROMETHAZINE HCL 12.5 MG PO TABS: 12.5000 mg | ORAL_TABLET | Freq: Four times a day (QID) | ORAL | Status: DC | PRN

## 2016-02-05 NOTE — Discharge Instructions (Signed)
Head Injury, Adult °You have received a head injury. It does not appear serious at this time. Headaches and vomiting are common following head injury. It should be easy to awaken from sleeping. Sometimes it is necessary for you to stay in the emergency department for a while for observation. Sometimes admission to the hospital may be needed. After injuries such as yours, most problems occur within the first 24 hours, but side effects may occur up to 7-10 days after the injury. It is important for you to carefully monitor your condition and contact your health care provider or seek immediate medical care if there is a change in your condition. °WHAT ARE THE TYPES OF HEAD INJURIES? °Head injuries can be as minor as a bump. Some head injuries can be more severe. More severe head injuries include: °· A jarring injury to the brain (concussion). °· A bruise of the brain (contusion). This mean there is bleeding in the brain that can cause swelling. °· A cracked skull (skull fracture). °· Bleeding in the brain that collects, clots, and forms a bump (hematoma). °WHAT CAUSES A HEAD INJURY? °A serious head injury is most likely to happen to someone who is in a car wreck and is not wearing a seat belt. Other causes of major head injuries include bicycle or motorcycle accidents, sports injuries, and falls. °HOW ARE HEAD INJURIES DIAGNOSED? °A complete history of the event leading to the injury and your current symptoms will be helpful in diagnosing head injuries. Many times, pictures of the brain, such as CT or MRI are needed to see the extent of the injury. Often, an overnight hospital stay is necessary for observation.  °WHEN SHOULD I SEEK IMMEDIATE MEDICAL CARE?  °You should get help right away if: °· You have confusion or drowsiness. °· You feel sick to your stomach (nauseous) or have continued, forceful vomiting. °· You have dizziness or unsteadiness that is getting worse. °· You have severe, continued headaches not  relieved by medicine. Only take over-the-counter or prescription medicines for pain, fever, or discomfort as directed by your health care provider. °· You do not have normal function of the arms or legs or are unable to walk. °· You notice changes in the black spots in the center of the colored part of your eye (pupil). °· You have a clear or bloody fluid coming from your nose or ears. °· You have a loss of vision. °During the next 24 hours after the injury, you must stay with someone who can watch you for the warning signs. This person should contact local emergency services (911 in the [place] if you have seizures, you become unconscious, or you are unable to wake up. °HOW CAN I PREVENT A HEAD INJURY IN THE FUTURE? °The most important factor for preventing major head injuries is avoiding motor vehicle accidents.  To minimize the potential for damage to your head, it is crucial to wear seat belts while riding in motor vehicles. Wearing helmets while bike riding and playing collision sports (like football) is also helpful. Also, avoiding dangerous activities around the house will further help reduce your risk of head injury.  °WHEN CAN I RETURN TO NORMAL ACTIVITIES AND ATHLETICS? °You should be reevaluated by your health care provider before returning to these activities. If you have any of the following symptoms, you should not return to activities or contact sports until 1 week after the symptoms have stopped: °· Persistent headache. °· Dizziness or vertigo. °· Poor attention and concentration. °· Confusion. °·   Memory problems.  Nausea or vomiting.  Fatigue or tire easily.  Irritability.  Intolerant of bright lights or loud noises.  Anxiety or depression.  Disturbed sleep. MAKE SURE YOU:   Understand these instructions.  Will watch your condition.  Will get help right away if you are not doing well or get worse.   This information is not intended to replace advice given to you by your health  care provider. Make sure you discuss any questions you have with your health care provider.   Document Released: 08/16/2005 Document Revised: 09/06/2014 Document Reviewed: 04/23/2013 Elsevier Interactive Patient Education 2016 [employer] Muscle Cramps and Spasms Muscle cramps and spasms occur when a muscle or muscles tighten and you have no control over this tightening (involuntary muscle contraction). They are a common problem and can develop in any muscle. The most common place is in the calf muscles of the leg. Both muscle cramps and muscle spasms are involuntary muscle contractions, but they also have differences:   Muscle cramps are sporadic and painful. They may last a few seconds to a quarter of an hour. Muscle cramps are often more forceful and last longer than muscle spasms.  Muscle spasms may or may not be painful. They may also last just a few seconds or much longer. CAUSES  It is uncommon for cramps or spasms to be due to a serious underlying problem. In many cases, the cause of cramps or spasms is unknown. Some common causes are:   Overexertion.   Overuse from repetitive motions (doing the same thing over and over).   Remaining in a certain position for a long period of time.   Improper preparation, form, or technique while performing a sport or activity.   Dehydration.   Injury.   Side effects of some medicines.   Abnormally low levels of the salts and ions in your blood (electrolytes), especially potassium and calcium. This could happen if you are taking water pills (diuretics) or you are pregnant.  Some underlying medical problems can make it more likely to develop cramps or spasms. These include, but are not limited to:   Diabetes.   Parkinson disease.   Hormone disorders, such as thyroid problems.   Alcohol abuse.   Diseases specific to muscles, joints, and bones.   Blood vessel disease where not enough blood is getting to the muscles.   HOME CARE INSTRUCTIONS   Stay well hydrated. Drink enough water and fluids to keep your urine clear or pale yellow.  It may be helpful to massage, stretch, and relax the affected muscle.  For tight or tense muscles, use a warm towel, heating pad, or hot shower water directed to the affected area.  If you are sore or have pain after a cramp or spasm, applying ice to the affected area may relieve discomfort.  Put ice in a plastic bag.  Place a towel between your skin and the bag.  Leave the ice on for 15-20 minutes, 03-04 times a day.  Medicines used to treat a known cause of cramps or spasms may help reduce their frequency or severity. Only take over-the-counter or prescription medicines as directed by your caregiver. SEEK MEDICAL CARE IF:  Your cramps or spasms get more severe, more frequent, or do not improve over time.  MAKE SURE YOU:   Understand these instructions.  Will watch your condition.  Will get help right away if you are not doing well or get worse.   This information is not intended to  replace advice given to you by your health care provider. Make sure you discuss any questions you have with your health care provider.   Document Released: 02/05/2002 Document Revised: 12/11/2012 Document Reviewed: 08/02/2012 Elsevier Interactive Patient Education [employer]

## 2016-02-05 NOTE — ED Notes (Signed)
Pt in with co migraine since this am hx of the same. Was seen here Saturday for syncope states all tests including CT scan were WNL.

## 2016-02-05 NOTE — ED Provider Notes (Signed)
[facility] Emergency Department Provider Note  ____________________________________________  Time seen: Approximately 9:26 PM  I have reviewed the triage vital signs and the nursing notes.   HISTORY  Chief Complaint Migraine    HPI David Leach is a 40 y.o. male who presents to emergency department complaining of generalized headache and left-sided neck/shoulder pain. Patient was seen in this department on 01/31/2016 after loss of consciousness and striking head. Patient states that he has had a headache since this event. He endorses photophobia, mild nausea, and left-sided neck pain. Patient denies any visual changes, emesis, chest pain, shortness of breath. Patient denies any repeat syncopal episodes.   Past Medical History  Diagnosis Date  . Asthma   . Right-sided back pain   . Headache   . Chronic dental pain   . Chronic shoulder pain     There are no active problems to display for this patient.   Past Surgical History  Procedure Laterality Date  . Tonsillectomy      Current Outpatient Rx  Name  Route  Sig  Dispense  Refill  . albuterol (PROVENTIL HFA;VENTOLIN HFA) 108 (90 Base) MCG/ACT inhaler   Inhalation   Inhale 2 puffs into the lungs every 6 (six) hours as needed for wheezing or shortness of breath.   1 Inhaler   2   . amoxicillin (AMOXIL) 500 MG tablet   Oral   Take 1 tablet (500 mg total) by mouth 2 (two) times daily.   20 tablet   0   . ketorolac (TORADOL) 10 MG tablet   Oral   Take 1 tablet (10 mg total) by mouth every 6 (six) hours as needed.   20 tablet   0   . methocarbamol (ROBAXIN) 500 MG tablet   Oral   Take 1 tablet (500 mg total) by mouth 4 (four) times daily.   16 tablet   0   . naproxen (NAPROSYN) 500 MG tablet   Oral   Take 1 tablet (500 mg total) by mouth 2 (two) times daily with a meal.   60 tablet   0   . oxyCODONE-acetaminophen (ROXICET) 5-325 MG tablet   Oral   Take 1-2 tablets by mouth  every 4 (four) hours as needed for severe pain.   15 tablet   0   . promethazine (PHENERGAN) 12.5 MG tablet   Oral   Take 1 tablet (12.5 mg total) by mouth every 6 (six) hours as needed for nausea or vomiting.   20 tablet   0   . traMADol (ULTRAM) 50 MG tablet   Oral   Take 1 tablet (50 mg total) by mouth every 6 (six) hours as needed.   12 tablet   0     Allergies Hydrocodone  Family History  Problem Relation Age of Onset  . Cancer Mother   . Diabetes Father   . Cancer Other   . Stroke Other   . Diabetes Other     Social History Social History  Substance Use Topics  . Smoking status: Current Every Day Smoker -- 1.00 packs/day for 20 years    Types: Cigarettes  . Smokeless tobacco: Former Neurosurgeon    Types: Chew  . Alcohol Use: Yes     Comment: occ     Review of Systems  Constitutional: No fever/chills Eyes: No visual changes.  Cardiovascular: no chest pain. Respiratory: no cough. No SOB. Gastrointestinal: No abdominal pain.  Positive for nausea but denies vomiting.  Musculoskeletal: As above for left-sided neck pain. Skin: Negative for rash, abrasions, lacerations, ecchymosis. Neurological: Positive for headache but denies focal weakness or numbness. 10-point ROS otherwise negative.  ____________________________________________   PHYSICAL EXAM:  VITAL SIGNS: ED Triage Vitals  Enc Vitals Group     BP 02/05/16 2033 125/84 mmHg     Pulse Rate 02/05/16 2033 97     Resp 02/05/16 2033 18     Temp 02/05/16 2033 98 F (36.7 C)     Temp Source 02/05/16 2033 Oral     SpO2 02/05/16 2033 99 %     Weight 02/05/16 2033 165 lb (74.844 kg)     Height 02/05/16 2033 5\' 11"  (1.803 m)     Head Cir --      Peak Flow --      Pain Score 02/05/16 2034 10     Pain Loc --      Pain Edu? --      Excl. in GC? --      Constitutional: Alert and oriented. Well appearing and in no acute distress. Eyes: Conjunctivae are normal. PERRL. EOMI. Head: In place staples to the  right parietal scalp region. No other signs of trauma. Neck: No stridor.  No Midline cervical spine tenderness to palpation. Patient is tender to palpation over the paraspinal muscle group on the left paraspinal cervical region. No palpable abnormality. Full range of motion to neck.  Cardiovascular: Normal rate, regular rhythm. Normal S1 and S2.  Good peripheral circulation. Respiratory: Normal respiratory effort without tachypnea or retractions. Lungs CTAB. Good air entry to the bases with no decreased or absent breath sounds. Musculoskeletal: Full range of motion to all extremities. No gross deformities appreciated. Neurologic:  Normal speech and language. No gross focal neurologic deficits are appreciated. Cranial nerves II through XII are grossly intact. No Skin:  Skin is warm, dry and intact. No rash noted. Psychiatric: Mood and affect are normal. Speech and behavior are normal. Patient exhibits appropriate insight and judgement.   ____________________________________________   LABS (all labs ordered are listed, but only abnormal results are displayed)  Labs Reviewed - No data to display ____________________________________________  EKG   ____________________________________________  RADIOLOGY   CT results are reviewed from 01/31/2016. ____________________________________________    PROCEDURES  Procedure(s) performed:       Medications  prochlorperazine (COMPAZINE) injection 10 mg (10 mg Intramuscular Given 02/05/16 2306)  ketorolac (TORADOL) 30 MG/ML injection 30 mg (30 mg Intramuscular Given 02/05/16 2307)  diphenhydrAMINE (BENADRYL) injection 50 mg (50 mg Intramuscular Given 02/05/16 2307)     ____________________________________________   INITIAL IMPRESSION / ASSESSMENT AND PLAN / ED COURSE  Pertinent labs & imaging results that were available during my care of the patient were reviewed by me and considered in my medical decision making (see chart for  details).  Patient's diagnosis is consistent with Posttraumatic headache and cervical paraspinal muscle spasms. Exam is reassuring. Patient had CT scans after initial event with no acute abnormality. Patient is given migraine cocktail here in the emergency department with good symptom relief. Patient will be discharged home with prescriptions for symptomatically medications of Toradol tablets, Robaxin, and Phenergan. Patient is to follow up with primary care provider as needed or otherwise directed. Patient is given ED precautions to return to the ED for any worsening or new symptoms.     ____________________________________________  FINAL CLINICAL IMPRESSION(S) / ED DIAGNOSES  Final diagnoses:  Acute post-traumatic headache, not intractable  Cervical paraspinal muscle spasm  NEW MEDICATIONS STARTED DURING THIS VISIT:  Discharge Medication List as of 02/05/2016 10:53 PM    START taking these medications   Details  ketorolac (TORADOL) 10 MG tablet Take 1 tablet (10 mg total) by mouth every 6 (six) hours as needed., Starting 02/05/2016, Until Discontinued, Print    methocarbamol (ROBAXIN) 500 MG tablet Take 1 tablet (500 mg total) by mouth 4 (four) times daily., Starting 02/05/2016, Until Discontinued, Print    promethazine (PHENERGAN) 12.5 MG tablet Take 1 tablet (12.5 mg total) by mouth every 6 (six) hours as needed for nausea or vomiting., Starting 02/05/2016, Until Discontinued, Print            This chart was dictated using voice recognition software/Dragon. Despite best efforts to proofread, errors can occur which can change the meaning. Any change was purely unintentional.    Racheal Patches, PA-C 02/06/16 0007  Loleta Rose, MD 02/06/16 1610

## 2016-02-05 NOTE — ED Notes (Signed)
Pt reports falling on Saturday and having a head laceration repaired here. Pt reports since his fall her has had head pain. Pt reports hx of migraines but has been having dizziness and intermittent nausea since Saturday as well. Pt denies vomiting at this time.

## 2016-09-06 ENCOUNTER — Emergency Department: Payer: Medicaid Other

## 2016-09-06 ENCOUNTER — Emergency Department
Admission: EM | Admit: 2016-09-06 | Discharge: 2016-09-06 | Disposition: A | Discharge status: Home / Self Care | Payer: Medicaid Other | Attending: Emergency Medicine | Admitting: Emergency Medicine

## 2016-09-06 DIAGNOSIS — J45909 Unspecified asthma, uncomplicated: Secondary | ICD-10-CM | POA: Insufficient documentation

## 2016-09-06 DIAGNOSIS — Y939 Activity, unspecified: Secondary | ICD-10-CM | POA: Insufficient documentation

## 2016-09-06 DIAGNOSIS — R52 Pain, unspecified: Secondary | ICD-10-CM | POA: Insufficient documentation

## 2016-09-06 DIAGNOSIS — R05 Cough: Secondary | ICD-10-CM | POA: Insufficient documentation

## 2016-09-06 DIAGNOSIS — R0981 Nasal congestion: Secondary | ICD-10-CM | POA: Insufficient documentation

## 2016-09-06 DIAGNOSIS — Z791 Long term (current) use of non-steroidal anti-inflammatories (NSAID): Secondary | ICD-10-CM | POA: Insufficient documentation

## 2016-09-06 DIAGNOSIS — W01198A Fall on same level from slipping, tripping and stumbling with subsequent striking against other object, initial encounter: Secondary | ICD-10-CM | POA: Insufficient documentation

## 2016-09-06 DIAGNOSIS — G43801 Other migraine, not intractable, with status migrainosus: Secondary | ICD-10-CM | POA: Insufficient documentation

## 2016-09-06 DIAGNOSIS — R6889 Other general symptoms and signs: Secondary | ICD-10-CM

## 2016-09-06 DIAGNOSIS — Y929 Unspecified place or not applicable: Secondary | ICD-10-CM | POA: Insufficient documentation

## 2016-09-06 DIAGNOSIS — Y999 Unspecified external cause status: Secondary | ICD-10-CM | POA: Insufficient documentation

## 2016-09-06 DIAGNOSIS — R509 Fever, unspecified: Secondary | ICD-10-CM | POA: Insufficient documentation

## 2016-09-06 DIAGNOSIS — F17210 Nicotine dependence, cigarettes, uncomplicated: Secondary | ICD-10-CM | POA: Insufficient documentation

## 2016-09-06 LAB — RAPID INFLUENZA A&B ANTIGENS (ARMC ONLY): INFLUENZA A (ARMC): NEGATIVE

## 2016-09-06 LAB — RAPID INFLUENZA A&B ANTIGENS: Influenza B (ARMC): NEGATIVE

## 2016-09-06 MED ORDER — DIPHENHYDRAMINE HCL 25 MG PO CAPS
50.0000 mg | ORAL_CAPSULE | Freq: Once | ORAL | Status: AC
  Administered 2016-09-06: 50 mg via ORAL
  Filled 2016-09-06: qty 2

## 2016-09-06 MED ORDER — KETOROLAC TROMETHAMINE 60 MG/2ML IM SOLN
15.0000 mg | Freq: Once | INTRAMUSCULAR | Status: AC
  Administered 2016-09-06: 15 mg via INTRAMUSCULAR
  Filled 2016-09-06: qty 2

## 2016-09-06 MED ORDER — ONDANSETRON 4 MG PO TBDP: 4.0000 mg | ORAL_TABLET | Freq: Three times a day (TID) | ORAL | 0 refills | Status: DC | PRN

## 2016-09-06 MED ORDER — METOCLOPRAMIDE HCL 10 MG PO TABS
10.0000 mg | ORAL_TABLET | Freq: Once | ORAL | Status: AC
  Administered 2016-09-06: 10 mg via ORAL
  Filled 2016-09-06: qty 1

## 2016-09-06 MED ORDER — ONDANSETRON 4 MG PO TBDP
8.0000 mg | ORAL_TABLET | ORAL | Status: AC
  Administered 2016-09-06: 8 mg via ORAL
  Filled 2016-09-06: qty 2

## 2016-09-06 MED ORDER — DIPHENHYDRAMINE HCL 25 MG PO CAPS: 50.0000 mg | ORAL_CAPSULE | Freq: Four times a day (QID) | ORAL | 0 refills | Status: DC | PRN

## 2016-09-06 MED ORDER — METOCLOPRAMIDE HCL 10 MG PO TABS: 10.0000 mg | ORAL_TABLET | Freq: Four times a day (QID) | ORAL | 0 refills | Status: DC | PRN

## 2016-09-06 MED ORDER — NAPROXEN 500 MG PO TABS: 500.0000 mg | ORAL_TABLET | Freq: Two times a day (BID) | ORAL | 0 refills | Status: DC

## 2016-09-06 NOTE — ED Notes (Signed)
Sleeping. Waiting on disposition

## 2016-09-06 NOTE — ED Triage Notes (Signed)
Patient reports woke Saturday feeling "bad" with fever, body aches and nasal congestion.  Also reports that he fell at work on Saturday and hit his head and thinks he may have had loss of consciousness.

## 2016-09-06 NOTE — ED Notes (Signed)
Lights dim. Resting.

## 2016-09-06 NOTE — ED Provider Notes (Signed)
[facility] Emergency Department Provider Note  ____________________________________________  Time seen: Approximately 8:32 AM  I have reviewed the triage vital signs and the nursing notes.   HISTORY  Chief Complaint Generalized Body Aches; Fever; and Head Injury    HPI David Leach is a 41 y.o. male reports that he had a trip and fall over an electrical cord at work 2 days ago and hit his head. He complains of pain in the back of the head around the occiput. Nonradiating, feels like it triggered his migraines and that he's had photosensitivity since then. No paresthesias or weakness. Also complains of generalized body aches nasal congestion and nonproductive cough for the past 3 days. Also has a fever which she feels is waxing and waning with chills. No vomiting or diarrhea.     Past Medical History:  Diagnosis Date  . Asthma   . Chronic dental pain   . Chronic shoulder pain   . Headache   . Right-sided back pain      There are no active problems to display for this patient.    Past Surgical History:  Procedure Laterality Date  . TONSILLECTOMY       Prior to Admission medications   Medication Sig Start Date End Date Taking? Authorizing Provider  albuterol (PROVENTIL HFA;VENTOLIN HFA) 108 (90 Base) MCG/ACT inhaler Inhale 2 puffs into the lungs every 6 (six) hours as needed for wheezing or shortness of breath. 12/16/15  Yes Charmayne Sheer Beers, PA-C  ibuprofen (ADVIL,MOTRIN) 800 MG tablet Take 800 mg by mouth every 8 (eight) hours as needed for pain. 11/04/14  Yes Historical Provider, MD  diphenhydrAMINE (BENADRYL) 25 mg capsule Take 2 capsules (50 mg total) by mouth every 6 (six) hours as needed. 09/06/16   Sharman Cheek, MD  metoCLOPramide (REGLAN) 10 MG tablet Take 1 tablet (10 mg total) by mouth every 6 (six) hours as needed. 09/06/16   Sharman Cheek, MD  naproxen (NAPROSYN) 500 MG tablet Take 1 tablet (500 mg total) by mouth 2 (two) times daily  with a meal. 09/06/16   Sharman Cheek, MD  ondansetron (ZOFRAN ODT) 4 MG disintegrating tablet Take 1 tablet (4 mg total) by mouth every 8 (eight) hours as needed for nausea or vomiting. 09/06/16   Sharman Cheek, MD     Allergies Hydrocodone   Family History  Problem Relation Age of Onset  . Cancer Mother   . Diabetes Father   . Cancer Other   . Stroke Other   . Diabetes Other     Social History Social History  Substance Use Topics  . Smoking status: Current Every Day Smoker    Packs/day: 1.00    Years: 20.00    Types: Cigarettes  . Smokeless tobacco: Former Neurosurgeon    Types: Chew  . Alcohol use Yes     Comment: occ    Review of Systems  Constitutional:   Positive fever and chills.  ENT:   No sore throat. Positive rhinorrhea. Cardiovascular:   No chest pain. Respiratory:   No dyspnea positive cough. Gastrointestinal:   Negative for abdominal pain, vomiting and diarrhea.  Genitourinary:   Negative for dysuria or difficulty urinating. Musculoskeletal:   Negative for focal pain or swelling. Positive diffuse myalgia Neurological:   Positive as above headache 10-point ROS otherwise negative.  ____________________________________________   PHYSICAL EXAM:  VITAL SIGNS: ED Triage Vitals [09/06/16 0619]  Enc Vitals Group     BP 132/75     Pulse Rate Marland Kitchen)  110     Resp (!) 22     Temp (!) 100.5 F (38.1 C)     Temp Source Oral     SpO2 96 %     Weight 175 lb (79.4 kg)     Height 5\' 11"  (1.803 m)     Head Circumference      Peak Flow      Pain Score      Pain Loc      Pain Edu?      Excl. in GC?   Temp 99.7 on my exam, heart rate 95  Vital signs reviewed, nursing assessments reviewed.   Constitutional:   Alert and oriented. Well appearing and in no distress. Eyes:   No scleral icterus. No conjunctival pallor. PERRL. EOMI.  No nystagmus. ENT   Head:   Normocephalic and atraumatic.   Nose:   No congestion/rhinnorhea. No septal hematoma    Mouth/Throat:   MMM, no pharyngeal erythema. No peritonsillar mass.    Neck:   No stridor. No SubQ emphysema. No meningismus. Hematological/Lymphatic/Immunilogical:   No cervical lymphadenopathy. No midline spinal tenderness, full range of motion Cardiovascular:   RRR. Symmetric bilateral radial and DP pulses.  No murmurs.  Respiratory:   Normal respiratory effort without tachypnea nor retractions. Breath sounds are clear and equal bilaterally. No wheezes/rales/rhonchi. Gastrointestinal:   Soft and nontender. Non distended. There is no CVA tenderness.  No rebound, rigidity, or guarding. Genitourinary:   deferred Musculoskeletal:   Nontender with normal range of motion in all extremities. No joint effusions.  No lower extremity tenderness.  No edema. Neurologic:   Normal speech and language.  CN 2-10 normal. Motor grossly intact. No gross focal neurologic deficits are appreciated.  Skin:    Skin is warm, dry and intact. No rash noted.  No petechiae, purpura, or bullae.  ____________________________________________    LABS (pertinent positives/negatives) (all labs ordered are listed, but only abnormal results are displayed) Labs Reviewed  RAPID INFLUENZA A&B ANTIGENS ([facility] ONLY)   ____________________________________________   EKG    ____________________________________________    RADIOLOGY  CT head and neck unremarkable  ____________________________________________   PROCEDURES Procedures  ____________________________________________   INITIAL IMPRESSION / ASSESSMENT AND PLAN / ED COURSE  Pertinent labs & imaging results that were available during my care of the patient were reviewed by me and considered in my medical decision making (see chart for details).  Patient well appearing no acute distress, presents with headache after a fall and blunt head trauma. CT scan negative. No evidence of trauma clinically. Patient denies any specific concussive symptoms such as  change in mood sleep or appetite or difficulty concentrating, feels this is more consistent with his migraines. I'll treat symptomatically and plan for discharge home.    ----------------------------------------- 11:14 AM on 09/06/2016 -----------------------------------------  Patient feeling better, sleeping, calm and comfortable. Vital signs normal at this time. We'll discharge home, symptomatic management. Return precautions given, follow-up with primary care. Low suspicion for meningitis encephalitis, significant traumatic brain injury, or sepsis.   Clinical Course    ____________________________________________   FINAL CLINICAL IMPRESSION(S) / ED DIAGNOSES  Final diagnoses:  Other migraine with status migrainosus, not intractable  Flu-like symptoms      New Prescriptions   DIPHENHYDRAMINE (BENADRYL) 25 MG CAPSULE    Take 2 capsules (50 mg total) by mouth every 6 (six) hours as needed.   METOCLOPRAMIDE (REGLAN) 10 MG TABLET    Take 1 tablet (10 mg total) by mouth every 6 (six)  hours as needed.   NAPROXEN (NAPROSYN) 500 MG TABLET    Take 1 tablet (500 mg total) by mouth 2 (two) times daily with a meal.   ONDANSETRON (ZOFRAN ODT) 4 MG DISINTEGRATING TABLET    Take 1 tablet (4 mg total) by mouth every 8 (eight) hours as needed for nausea or vomiting.     Portions of this note were generated with dragon dictation software. Dictation errors may occur despite best attempts at proofreading.    Sharman Cheek, MD 09/06/16 1115

## 2016-09-20 ENCOUNTER — Encounter: Payer: Self-pay | Admitting: Emergency Medicine

## 2016-09-20 ENCOUNTER — Emergency Department
Admission: EM | Admit: 2016-09-20 | Discharge: 2016-09-20 | Disposition: A | Discharge status: Home / Self Care | Payer: Medicaid Other | Attending: Emergency Medicine | Admitting: Emergency Medicine

## 2016-09-20 DIAGNOSIS — J45909 Unspecified asthma, uncomplicated: Secondary | ICD-10-CM | POA: Insufficient documentation

## 2016-09-20 DIAGNOSIS — F17210 Nicotine dependence, cigarettes, uncomplicated: Secondary | ICD-10-CM | POA: Insufficient documentation

## 2016-09-20 DIAGNOSIS — M5441 Lumbago with sciatica, right side: Secondary | ICD-10-CM

## 2016-09-20 DIAGNOSIS — Z79899 Other long term (current) drug therapy: Secondary | ICD-10-CM | POA: Insufficient documentation

## 2016-09-20 MED ORDER — PREDNISONE 10 MG (21) PO TBPK: 10.0000 mg | ORAL_TABLET | Freq: Every day | ORAL | 0 refills | Status: DC
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
## 2016-11-06 ENCOUNTER — Encounter: Payer: Self-pay | Admitting: *Deleted

## 2016-11-06 ENCOUNTER — Inpatient Hospital Stay
Admission: EM | Admit: 2016-11-06 | Discharge: 2016-11-07 | DRG: 176 | Disposition: A | Discharge status: Home / Self Care | Payer: No Typology Code available for payment source | Attending: Internal Medicine | Admitting: Internal Medicine

## 2016-11-06 ENCOUNTER — Emergency Department: Payer: No Typology Code available for payment source

## 2016-11-06 DIAGNOSIS — Z79899 Other long term (current) drug therapy: Secondary | ICD-10-CM

## 2016-11-06 DIAGNOSIS — M7989 Other specified soft tissue disorders: Secondary | ICD-10-CM

## 2016-11-06 DIAGNOSIS — I2699 Other pulmonary embolism without acute cor pulmonale: Secondary | ICD-10-CM | POA: Diagnosis present

## 2016-11-06 DIAGNOSIS — J45909 Unspecified asthma, uncomplicated: Secondary | ICD-10-CM | POA: Diagnosis present

## 2016-11-06 DIAGNOSIS — I82492 Acute embolism and thrombosis of other specified deep vein of left lower extremity: Secondary | ICD-10-CM | POA: Diagnosis present

## 2016-11-06 DIAGNOSIS — S93402A Sprain of unspecified ligament of left ankle, initial encounter: Secondary | ICD-10-CM | POA: Diagnosis present

## 2016-11-06 DIAGNOSIS — F17210 Nicotine dependence, cigarettes, uncomplicated: Secondary | ICD-10-CM | POA: Diagnosis present

## 2016-11-06 DIAGNOSIS — G8929 Other chronic pain: Secondary | ICD-10-CM | POA: Diagnosis present

## 2016-11-06 DIAGNOSIS — I82452 Acute embolism and thrombosis of left peroneal vein: Secondary | ICD-10-CM

## 2016-11-06 DIAGNOSIS — M79605 Pain in left leg: Secondary | ICD-10-CM

## 2016-11-06 DIAGNOSIS — I82409 Acute embolism and thrombosis of unspecified deep veins of unspecified lower extremity: Secondary | ICD-10-CM | POA: Diagnosis present

## 2016-11-06 LAB — CBC WITH DIFFERENTIAL/PLATELET
Basophils Absolute: 0.1 10*3/uL (ref 0–0.1)
Basophils Relative: 1 %
Eosinophils Absolute: 0.3 10*3/uL (ref 0–0.7)
Eosinophils Relative: 5 %
HEMATOCRIT: 41.7 % (ref 40.0–52.0)
HEMOGLOBIN: 14 g/dL (ref 13.0–18.0)
LYMPHS ABS: 2.4 10*3/uL (ref 1.0–3.6)
LYMPHS PCT: 36 %
MCH: 29.8 pg (ref 26.0–34.0)
MCHC: 33.6 g/dL (ref 32.0–36.0)
MCV: 88.7 fL (ref 80.0–100.0)
Monocytes Absolute: 0.9 10*3/uL (ref 0.2–1.0)
Monocytes Relative: 13 %
NEUTROS ABS: 3.1 10*3/uL (ref 1.4–6.5)
Neutrophils Relative %: 45 %
PLATELETS: 290 10*3/uL (ref 150–440)
RBC: 4.7 MIL/uL (ref 4.40–5.90)
RDW: 15.1 % — ABNORMAL HIGH (ref 11.5–14.5)
WBC: 6.8 10*3/uL (ref 3.8–10.6)

## 2016-11-06 LAB — APTT: aPTT: 30 seconds (ref 24–36)

## 2016-11-06 LAB — COMPREHENSIVE METABOLIC PANEL
ALK PHOS: 67 U/L (ref 38–126)
ALT: 19 U/L (ref 17–63)
AST: 29 U/L (ref 15–41)
Albumin: 3.6 g/dL (ref 3.5–5.0)
Anion gap: 5 (ref 5–15)
BILIRUBIN TOTAL: 0.8 mg/dL (ref 0.3–1.2)
BUN: 11 mg/dL (ref 6–20)
CALCIUM: 8.7 mg/dL — AB (ref 8.9–10.3)
CHLORIDE: 105 mmol/L (ref 101–111)
CO2: 30 mmol/L (ref 22–32)
CREATININE: 1.03 mg/dL (ref 0.61–1.24)
GFR calc Af Amer: >60 mL/min (ref >60)
GFR calc non Af Amer: >60 mL/min (ref >60)
Glucose, Bld: 85 mg/dL (ref 65–99)
Potassium: 3.9 mmol/L (ref 3.5–5.1)
Sodium: 140 mmol/L (ref 135–145)
Total Protein: 6.9 g/dL (ref 6.5–8.1)

## 2016-11-06 LAB — PROTIME-INR
INR: 0.95
Prothrombin Time: 12.7 seconds (ref 11.4–15.2)

## 2016-11-06 LAB — CKMB (ARMC ONLY): CK, MB: 2.4 ng/mL (ref 0.5–5.0)

## 2016-11-06 LAB — TROPONIN I: Troponin I: <0.03 ng/mL (ref <0.03)

## 2016-11-06 LAB — FIBRIN DERIVATIVES D-DIMER (ARMC ONLY): FIBRIN DERIVATIVES D-DIMER (ARMC): 794.54 — AB (ref 0.00–499.00)

## 2016-11-06 MED ORDER — IOPAMIDOL (ISOVUE-370) INJECTION 76%
75.0000 mL | Freq: Once | INTRAVENOUS | Status: AC | PRN
  Administered 2016-11-06: 75 mL via INTRAVENOUS
  Filled 2016-11-06: qty 75

## 2016-11-06 MED ORDER — HEPARIN SODIUM (PORCINE) 5000 UNIT/ML IJ SOLN: 4000.0000 [IU] | Freq: Once | INTRAMUSCULAR | Status: DC

## 2016-11-06 MED ORDER — MORPHINE SULFATE (PF) 4 MG/ML IV SOLN
4.0000 mg | Freq: Once | INTRAVENOUS | Status: AC
  Administered 2016-11-07: 4 mg via INTRAVENOUS
  Filled 2016-11-06: qty 1

## 2016-11-06 MED ORDER — MORPHINE SULFATE (PF) 2 MG/ML IV SOLN
2.0000 mg | Freq: Once | INTRAVENOUS | Status: AC
  Administered 2016-11-06: 2 mg via INTRAVENOUS
  Filled 2016-11-06: qty 1
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
## 2016-11-06 NOTE — ED Provider Notes (Signed)
ED ECG REPORT I, Willy EddyPatrick Jelisa , the attending physician, personally viewed and interpreted this ECG.   Date: 11/06/2016  EKG Time: 18:24  Rate: 80  Rhythm: sinus  Axis: normal Interpretation and Intervals: RBBB, no STEMI, normal intervals     Willy EddyPatrick Onedia Vargus, MD 11/06/16 1826

## 2016-11-06 NOTE — H&P (Signed)
[facility] [place] [facility]   PATIENT NAME: David Leach    MR#:  [id number]  DATE OF BIRTH:  05/21/1976  DATE OF ADMISSION:  11/06/2016  PRIMARY CARE PHYSICIAN: Pcp Not In System   REQUESTING/REFERRING PHYSICIAN: Shaune PollackLord, MD  CHIEF COMPLAINT:   Chief Complaint  Patient presents with  . Leg Injury    HISTORY OF PRESENT ILLNESS:  David Leach  is a 41 y.o. male who presents with 10 days leg swelling and pain in his left lower extremity, about a week of progressive chest pain and shortness of breath. Here he was found to have left lower extremity DVT as well as pulmonary embolism. Hospitalists were called for admission  PAST MEDICAL HISTORY:   Past Medical History:  Diagnosis Date  . Asthma   . Chronic dental pain   . Chronic shoulder pain   . Headache   . Right-sided back pain     PAST SURGICAL HISTORY:   Past Surgical History:  Procedure Laterality Date  . TONSILLECTOMY      SOCIAL HISTORY:   Social History  Substance Use Topics  . Smoking status: Current Every Day Smoker    Packs/day: 1.00    Years: 20.00    Types: Cigarettes  . Smokeless tobacco: Former NeurosurgeonUser    Types: Chew  . Alcohol use Yes     Comment: occ    FAMILY HISTORY:   Family History  Problem Relation Age of Onset  . Cancer Mother   . Diabetes Father   . Cancer Other   . Stroke Other   . Diabetes Other     DRUG ALLERGIES:   Allergies  Allergen Reactions  . Hydrocodone Rash    MEDICATIONS AT HOME:   Prior to Admission medications   Medication Sig Start Date End Date Taking? Authorizing Provider  albuterol (PROVENTIL HFA;VENTOLIN HFA) 108 (90 Base) MCG/ACT inhaler Inhale 2 puffs into the lungs every 6 (six) hours as needed for wheezing or shortness of breath. 12/16/15   Evangeline Dakinharles M Beers, PA-C  diphenhydrAMINE (BENADRYL) 25 mg capsule Take 2 capsules (50 mg total) by mouth every 6 (six) hours as needed. 09/06/16   Sharman CheekPhillip Stafford, MD  ibuprofen  (ADVIL,MOTRIN) 800 MG tablet Take 800 mg by mouth every 8 (eight) hours as needed for pain. 11/04/14   Historical Provider, MD  metoCLOPramide (REGLAN) 10 MG tablet Take 1 tablet (10 mg total) by mouth every 6 (six) hours as needed. 09/06/16   Sharman CheekPhillip Stafford, MD  naproxen (NAPROSYN) 500 MG tablet Take 1 tablet (500 mg total) by mouth 2 (two) times daily with a meal. 09/06/16   Sharman CheekPhillip Stafford, MD  ondansetron (ZOFRAN ODT) 4 MG disintegrating tablet Take 1 tablet (4 mg total) by mouth every 8 (eight) hours as needed for nausea or vomiting. 09/06/16   Sharman CheekPhillip Stafford, MD  predniSONE (STERAPRED UNI-PAK 21 TAB) 10 MG (21) TBPK tablet Take 1 tablet (10 mg total) by mouth daily. Take 6 tablets the first day. Take 5 tablets the second day. Take 4 tablets the third day.Take 3 tablets the fourth day. Take 2 tablets the fifth day. Take 1 tablet the 6 day. 09/20/16   Orvil FeilJaclyn M Woods, PA-C    REVIEW OF SYSTEMS:  Review of Systems  Constitutional: Negative for chills, fever, malaise/fatigue and weight loss.  HENT: Negative for ear pain, hearing loss and tinnitus.   Eyes: Negative for blurred vision, double vision, pain and redness.  Respiratory: Negative for cough, hemoptysis and  shortness of breath.   Cardiovascular: Positive for chest pain and leg swelling. Negative for palpitations and orthopnea.  Gastrointestinal: Negative for abdominal pain, constipation, diarrhea, nausea and vomiting.  Genitourinary: Negative for dysuria, frequency and hematuria.  Musculoskeletal: Negative for back pain, joint pain and neck pain.  Skin:       No acne, rash, or lesions  Neurological: Negative for dizziness, tremors, focal weakness and weakness.  Endo/Heme/Allergies: Negative for polydipsia. Does not bruise/bleed easily.  Psychiatric/Behavioral: Negative for depression. The patient is not nervous/anxious and does not have insomnia.      VITAL SIGNS:   Vitals:   11/06/16 1743  BP: 125/83  Pulse: 89  Resp: 15  Temp:  97.8 F (36.6 C)  TempSrc: Oral  SpO2: 98%  Weight: 80.7 kg (178 lb)  Height: 5\' 11"  (1.803 m)   Wt Readings from Last 3 Encounters:  11/06/16 80.7 kg (178 lb)  09/20/16 80.7 kg (178 lb)  09/06/16 79.4 kg (175 lb)    PHYSICAL EXAMINATION:  Physical Exam  Vitals reviewed. Constitutional: He is oriented to person, place, and time. He appears well-developed and well-nourished. No distress.  HENT:  Head: Normocephalic and atraumatic.  Mouth/Throat: Oropharynx is clear and moist.  Eyes: Conjunctivae and EOM are normal. Pupils are equal, round, and reactive to light. No scleral icterus.  Neck: Normal range of motion. Neck supple. No JVD present. No thyromegaly present.  Cardiovascular: Normal rate, regular rhythm and intact distal pulses.  Exam reveals no gallop and no friction rub.   No murmur heard. Respiratory: Effort normal. No respiratory distress. He has wheezes. He has no rales.  GI: Soft. Bowel sounds are normal. He exhibits no distension. There is no tenderness.  Musculoskeletal: Normal range of motion. He exhibits edema.  No arthritis, no gout  Lymphadenopathy:    He has no cervical adenopathy.  Neurological: He is alert and oriented to person, place, and time. No cranial nerve deficit.  No dysarthria, no aphasia  Skin: Skin is warm and dry. No rash noted. No erythema.  Psychiatric: He has a normal mood and affect. His behavior is normal. Judgment and thought content normal.    LABORATORY PANEL:   CBC  Recent Labs Lab 11/06/16 1819  WBC 6.8  HGB 14.0  HCT 41.7  PLT 290   ------------------------------------------------------------------------------------------------------------------  Chemistries   Recent Labs Lab 11/06/16 1819  NA 140  K 3.9  CL 105  CO2 30  GLUCOSE 85  BUN 11  CREATININE 1.03  CALCIUM 8.7*  AST 29  ALT 19  ALKPHOS 67  BILITOT 0.8    ------------------------------------------------------------------------------------------------------------------  Cardiac Enzymes  Recent Labs Lab 11/06/16 1819  TROPONINI <0.03   ------------------------------------------------------------------------------------------------------------------  RADIOLOGY:  Dg Chest 2 View  Result Date: 11/06/2016 CLINICAL DATA:  Chest pain. EXAM: CHEST  2 VIEW COMPARISON:  11/18/2014 and multiple prior radiograph FINDINGS: The cardiomediastinal silhouette is unremarkable. An 8 mm nodular opacity overlying the mid -lower left lung is noted. There is no evidence of focal airspace disease, pulmonary edema, mass, pleural effusion, or pneumothorax. No acute bony abnormalities are identified. IMPRESSION: 8 mm nodular opacity overlying the mid -lower left lung, which may represent nipple shadow. Recommend frontal chest radiograph with nipple markers. No other significant abnormality. Electronically Signed   By: Harmon Pier M.D.   On: 11/06/2016 19:15   Dg Tibia/fibula Left  Result Date: 11/06/2016 CLINICAL DATA:  Left lower leg pain and swelling. Initial encounter. EXAM: LEFT TIBIA AND FIBULA - 2 VIEW COMPARISON:  None. FINDINGS: There is no evidence of fracture, subluxation, or dislocation. No focal bony lesions are identified. No radiopaque foreign bodies are noted. Diffuse soft tissue swelling is noted. IMPRESSION: Soft tissue swelling without bony abnormality. Electronically Signed   By: Harmon Pier M.D.   On: 11/06/2016 19:16   Ct Angio Chest Pe W And/or Wo Contrast  Result Date: 11/06/2016 CLINICAL DATA:  Chest pain and shortness of breath. DVT diagnosed in left calf veins today. Possible left lung nodule on today's chest radiograph. EXAM: CT ANGIOGRAPHY CHEST WITH CONTRAST TECHNIQUE: Multidetector CT imaging of the chest was performed using the standard protocol during bolus administration of intravenous contrast. Multiplanar CT image reconstructions and  MIPs were obtained to evaluate the vascular anatomy. CONTRAST:  75 cc intravenous Isovue 370 COMPARISON:  11/06/2016 left lower extremity ultrasound and chest radiograph. 06/01/2006 chest CT FINDINGS: Cardiovascular: Segmental pulmonary emboli within both lower lobes and the right middle lobe identified, best identified on the thin sections. RV/LV ratio is 0.86. Upper limits normal heart size noted. There is no evidence of thoracic aortic aneurysm. No pericardial effusion identified. Mediastinum/Nodes: No enlarged mediastinal, hilar, or axillary lymph nodes. Thyroid gland, trachea, and esophagus demonstrate no significant findings. Lungs/Pleura: There is no evidence of airspace disease, consolidation, nodule, mass, pleural effusion or pneumothorax. Nodular opacity identified on earlier chest radiograph is compatible with nipple. Upper Abdomen: No acute abnormality. Musculoskeletal: No chest wall abnormality. No acute or significant osseous findings. Review of the MIP images confirms the above findings. IMPRESSION: Bilateral lower lobe and right middle lobe segmental pulmonary emboli. No CT evidence of right heart strain. Nodular opacity on earlier chest x-ray represent nipple shadow. Critical Value/emergent results were called by telephone at the time of interpretation on 11/06/2016 at 9:33 pm to [facility] , who verbally acknowledged these results. Electronically Signed   By: Harmon Pier M.D.   On: 11/06/2016 20:35   US Venous Img Lower Unilateral Left  Result Date: 11/06/2016 CLINICAL DATA:  41 year old male with left lower extremity pain and swelling for 3 weeks. EXAM: LEFT LOWER EXTREMITY VENOUS DOPPLER ULTRASOUND TECHNIQUE: Gray-scale sonography with graded compression, as well as color Doppler and duplex ultrasound were performed to evaluate the lower extremity deep venous systems from the level of the common femoral vein and including the common femoral, femoral, profunda femoral, popliteal and  calf veins including the posterior tibial, peroneal and gastrocnemius veins when visible. The superficial great saphenous vein was also interrogated. Spectral Doppler was utilized to evaluate flow at rest and with distal augmentation maneuvers in the common femoral, femoral and popliteal veins. COMPARISON:  None. FINDINGS: Nonocclusive thrombus is identified within the left peroneal veins. No other DVT is identified within the left lower extremity. A 1.1 cm popliteal/Baker's cyst is noted. IMPRESSION: Nonocclusive DVT within the left peroneal/calf veins. Critical Value/emergent results were called by telephone at the time of interpretation on 11/06/2016 at 8:16 pm to [facility] , who verbally acknowledged these results. Electronically Signed   By: Harmon Pier M.D.   On: 11/06/2016 20:17    EKG:   Orders placed or performed during the hospital encounter of 11/06/16  . ED EKG  . ED EKG    IMPRESSION AND PLAN:  Principal Problem:   Pulmonary embolism (HCC) - no heart strain seen on CT. IV heparin started.  Unclear what risk factors patient had other than smoking Active Problems:   DVT (deep venous thrombosis) (HCC) - IV anticoagulation started as above.   Asthma -  home dose inhaler  All the records are reviewed and case discussed with ED provider. Management plans discussed with the patient and/or family.  DVT PROPHYLAXIS: Systemic anticoagulation  GI PROPHYLAXIS: None  ADMISSION STATUS: Inpatient  CODE STATUS: Full Code Status History    This patient does not have a recorded code status. Please follow your organizational policy for patients in this situation.      TOTAL TIME TAKING CARE OF THIS PATIENT: 45 minutes.    Bowe Sidor FIELDING 11/06/2016, 11:56 PM  [facility]  Office  [phone]  CC: Primary care physician; Pcp Not In System
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
## 2016-11-07 ENCOUNTER — Inpatient Hospital Stay: Admit: 2016-11-07 | Payer: No Typology Code available for payment source

## 2016-11-07 LAB — CBC
HEMATOCRIT: 37.5 % — AB (ref 40.0–52.0)
Hemoglobin: 12.7 g/dL — ABNORMAL LOW (ref 13.0–18.0)
MCH: 30.1 pg (ref 26.0–34.0)
MCHC: 33.8 g/dL (ref 32.0–36.0)
MCV: 89 fL (ref 80.0–100.0)
PLATELETS: 277 10*3/uL (ref 150–440)
RBC: 4.22 MIL/uL — ABNORMAL LOW (ref 4.40–5.90)
RDW: 15.1 % — AB (ref 11.5–14.5)
WBC: 6.8 10*3/uL (ref 3.8–10.6)

## 2016-11-07 LAB — BASIC METABOLIC PANEL
Anion gap: 4 — ABNORMAL LOW (ref 5–15)
BUN: 13 mg/dL (ref 6–20)
CHLORIDE: 106 mmol/L (ref 101–111)
CO2: 31 mmol/L (ref 22–32)
CREATININE: 1.14 mg/dL (ref 0.61–1.24)
Calcium: 8.5 mg/dL — ABNORMAL LOW (ref 8.9–10.3)
GFR calc Af Amer: >60 mL/min (ref >60)
GFR calc non Af Amer: >60 mL/min (ref >60)
Glucose, Bld: 88 mg/dL (ref 65–99)
POTASSIUM: 4.4 mmol/L (ref 3.5–5.1)
Sodium: 141 mmol/L (ref 135–145)

## 2016-11-07 LAB — HEPARIN LEVEL (UNFRACTIONATED): Heparin Unfractionated: 0.75 IU/mL — ABNORMAL HIGH (ref 0.30–0.70)

## 2016-11-07 MED ORDER — OXYCODONE-ACETAMINOPHEN 5-325 MG PO TABS: 1.0000 | ORAL_TABLET | Freq: Four times a day (QID) | ORAL | 0 refills | Status: DC | PRN

## 2016-11-07 MED ORDER — APIXABAN 5 MG PO TABS: 10.0000 mg | ORAL_TABLET | Freq: Two times a day (BID) | ORAL | 0 refills | Status: DC

## 2016-11-07 MED ORDER — NICOTINE 14 MG/24HR TD PT24: 14.0000 mg | MEDICATED_PATCH | Freq: Every day | TRANSDERMAL | 0 refills | Status: DC

## 2016-11-07 MED ORDER — ACETAMINOPHEN 650 MG RE SUPP
650.0000 mg | Freq: Four times a day (QID) | RECTAL | Status: DC | PRN
Start: 2016-11-07 — End: 2016-11-07

## 2016-11-07 MED ORDER — ACETAMINOPHEN 325 MG PO TABS: 650.0000 mg | ORAL_TABLET | Freq: Four times a day (QID) | ORAL | Status: DC | PRN

## 2016-11-07 MED ORDER — ONDANSETRON HCL 4 MG PO TABS: 4.0000 mg | ORAL_TABLET | Freq: Four times a day (QID) | ORAL | Status: DC | PRN

## 2016-11-07 MED ORDER — APIXABAN 5 MG PO TABS: 5.0000 mg | ORAL_TABLET | Freq: Two times a day (BID) | ORAL | 0 refills | Status: DC

## 2016-11-07 MED ORDER — SODIUM CHLORIDE 0.9% FLUSH
3.0000 mL | Freq: Two times a day (BID) | INTRAVENOUS | Status: DC
  Administered 2016-11-07 (×2): 3 mL via INTRAVENOUS

## 2016-11-07 MED ORDER — MORPHINE SULFATE (PF) 4 MG/ML IV SOLN: 2.0000 mg | INTRAVENOUS | Status: DC | PRN

## 2016-11-07 MED ORDER — NICOTINE 14 MG/24HR TD PT24: 14.0000 mg | MEDICATED_PATCH | Freq: Every day | TRANSDERMAL | Status: DC

## 2016-11-07 MED ORDER — IPRATROPIUM-ALBUTEROL 0.5-2.5 (3) MG/3ML IN SOLN: 3.0000 mL | RESPIRATORY_TRACT | Status: DC | PRN

## 2016-11-07 MED ORDER — PNEUMOCOCCAL VAC POLYVALENT 25 MCG/0.5ML IJ INJ
0.5000 mL | INJECTION | INTRAMUSCULAR | Status: AC
  Administered 2016-11-07: 0.5 mL via INTRAMUSCULAR

## 2016-11-07 MED ORDER — APIXABAN 5 MG PO TABS
10.0000 mg | ORAL_TABLET | Freq: Two times a day (BID) | ORAL | Status: DC
  Administered 2016-11-07: 10 mg via ORAL
  Filled 2016-11-07 (×2): qty 2

## 2016-11-07 MED ORDER — HEPARIN (PORCINE) IN NACL 100-0.45 UNIT/ML-% IJ SOLN
1400.0000 [IU]/h | INTRAMUSCULAR | Status: DC
  Administered 2016-11-07: 1400 [IU]/h via INTRAVENOUS
  Filled 2016-11-07: qty 250

## 2016-11-07 MED ORDER — MORPHINE SULFATE (PF) 4 MG/ML IV SOLN
4.0000 mg | INTRAVENOUS | Status: DC | PRN
  Administered 2016-11-07: 4 mg via INTRAVENOUS
  Filled 2016-11-07: qty 1

## 2016-11-07 MED ORDER — ONDANSETRON HCL 4 MG/2ML IJ SOLN: 4.0000 mg | Freq: Four times a day (QID) | INTRAMUSCULAR | Status: DC | PRN

## 2016-11-07 MED ORDER — OXYCODONE-ACETAMINOPHEN 5-325 MG PO TABS
1.0000 | ORAL_TABLET | Freq: Four times a day (QID) | ORAL | Status: DC | PRN
  Administered 2016-11-07: 1 via ORAL
  Filled 2016-11-07: qty 1

## 2016-11-07 MED ORDER — HEPARIN BOLUS VIA INFUSION
5000.0000 [IU] | Freq: Once | INTRAVENOUS | Status: AC
  Administered 2016-11-07: 5000 [IU] via INTRAVENOUS
  Filled 2016-11-07: qty 5000

## 2016-11-07 MED ORDER — DIPHENHYDRAMINE HCL 50 MG/ML IJ SOLN
12.5000 mg | Freq: Once | INTRAMUSCULAR | Status: AC
  Administered 2016-11-07: 12.5 mg via INTRAVENOUS
  Filled 2016-11-07: qty 0.25

## 2016-11-07 MED ORDER — ALBUTEROL SULFATE (2.5 MG/3ML) 0.083% IN NEBU: 3.0000 mL | INHALATION_SOLUTION | Freq: Four times a day (QID) | RESPIRATORY_TRACT | Status: DC | PRN

## 2016-11-07 MED ORDER — BENZONATATE 100 MG PO CAPS: 200.0000 mg | ORAL_CAPSULE | Freq: Three times a day (TID) | ORAL | Status: DC | PRN

## 2016-11-07 MED ORDER — APIXABAN 5 MG PO TABS: 5.0000 mg | ORAL_TABLET | Freq: Two times a day (BID) | ORAL | Status: DC

## 2016-11-07 MED ORDER — HYDROCODONE-ACETAMINOPHEN 5-325 MG PO TABS
1.0000 | ORAL_TABLET | ORAL | Status: DC | PRN
  Administered 2016-11-07: 2 via ORAL
  Filled 2016-11-07: qty 2

## 2016-11-07 MED ORDER — INFLUENZA VAC SPLIT QUAD 0.5 ML IM SUSY
0.5000 mL | PREFILLED_SYRINGE | INTRAMUSCULAR | Status: AC
  Administered 2016-11-07: 0.5 mL via INTRAMUSCULAR

## 2016-11-07 MED ORDER — ALPRAZOLAM 0.5 MG PO TABS
0.5000 mg | ORAL_TABLET | Freq: Once | ORAL | Status: AC
  Administered 2016-11-07: 0.5 mg via ORAL
  Filled 2016-11-07: qty 1

## 2016-11-07 NOTE — Discharge Summary (Addendum)
[facility] [place] at [facility]   PATIENT NAME: David Leach    MR#:  [id number]  DATE OF BIRTH:  1975-11-30  DATE OF ADMISSION:  11/06/2016 ADMITTING PHYSICIAN: Oralia Manis, MD  DATE OF DISCHARGE: 11/07/2016  PRIMARY CARE PHYSICIAN: [facility]  patient was referred    ADMISSION DIAGNOSIS:  Bilateral pulmonary embolism (HCC) [I26.99] Pain and swelling of lower extremity, left [M79.605, M79.89] Acute deep vein thrombosis (DVT) of left peroneal vein (HCC) [I82.492]  DISCHARGE DIAGNOSIS:  Principal Problem:   Pulmonary embolism (HCC) Active Problems:   DVT (deep venous thrombosis) (HCC)   Asthma   SECONDARY DIAGNOSIS:   Past Medical History:  Diagnosis Date  . Asthma   . Chronic dental pain   . Chronic shoulder pain   . Headache   . Right-sided back pain     HOSPITAL COURSE:   41 year old male with no significant past medical history presents with left leg pain and found to have DVT and PE.   1. Bilateral lower lobe and right middle lobe segmental pulmonary emboli/Nonocclusive DVT within the left peroneal/calf veins: He has not had recent surgery, travel or bedbound. He has no underlying cancer. He was started on heparin drip and transitioned to oral Eliquis. He will need treatment for at least 6 month. He will have follow-up with oncology.  2. Ankle sprain after WUJ:[id number] with ACE bandage, compression, rest and ice.  DISCHARGE CONDITIONS AND DIET:   Stable for discharge on regular diet  CONSULTS OBTAINED:    DRUG ALLERGIES:   Allergies  Allergen Reactions  . Hydrocodone Rash    DISCHARGE MEDICATIONS:   Current Discharge Medication List    START taking these medications   Details  !! apixaban (ELIQUIS) 5 MG TABS tablet Take 2 tablets (10 mg total) by mouth 2 (two) times daily. Then take 5 mg twice a day Qty: 28 tablet, Refills: 0    !! apixaban (ELIQUIS) 5 MG TABS tablet Take 1 tablet (5 mg total) by mouth 2  (two) times daily. Qty: 60 tablet, Refills: 0    nicotine (NICODERM CQ - DOSED IN MG/24 HOURS) 14 mg/24hr patch Place 1 patch (14 mg total) onto the skin daily. Qty: 28 patch, Refills: 0    oxyCODONE-acetaminophen (PERCOCET/ROXICET) 5-325 MG tablet Take 1 tablet by mouth every 6 (six) hours as needed for moderate pain or severe pain. Qty: 10 tablet, Refills: 0     !! - Potential duplicate medications found. Please discuss with provider.    CONTINUE these medications which have NOT CHANGED   Details  albuterol (PROVENTIL HFA;VENTOLIN HFA) 108 (90 Base) MCG/ACT inhaler Inhale 2 puffs into the lungs every 6 (six) hours as needed for wheezing or shortness of breath. Qty: 1 Inhaler, Refills: 2          Today   CHIEF COMPLAINT:  Pain in left ankle no shortness of breath or chest pain   VITAL SIGNS:  Blood pressure 109/62, pulse 73, temperature 97.4 F (36.3 C), temperature source Oral, resp. rate 18, height 5\' 11"  (1.803 m), weight 76.7 kg (169 lb 3.2 oz), SpO2 100 %.   REVIEW OF SYSTEMS:  Review of Systems  Constitutional: Negative.  Negative for chills, fever and malaise/fatigue.  HENT: Negative.  Negative for ear discharge, ear pain, hearing loss, nosebleeds and sore throat.   Eyes: Negative.  Negative for blurred vision and pain.  Respiratory: Negative.  Negative for cough, hemoptysis, shortness of breath and wheezing.   Cardiovascular:  Negative.  Negative for chest pain, palpitations and leg swelling.  Gastrointestinal: Negative.  Negative for abdominal pain, blood in stool, diarrhea, nausea and vomiting.  Genitourinary: Negative.  Negative for dysuria.  Musculoskeletal: Positive for joint pain. Negative for back pain.       Leg pain ankle pain  Skin: Negative.   Neurological: Negative for dizziness, tremors, speech change, focal weakness, seizures and headaches.  Endo/Heme/Allergies: Negative.  Does not bruise/bleed easily.  Psychiatric/Behavioral: Negative.  Negative  for depression, hallucinations and suicidal ideas.     PHYSICAL EXAMINATION:  GENERAL:  41 y.o.-year-old patient lying in the bed with no acute distress.  NECK:  Supple, no jugular venous distention. No thyroid enlargement, no tenderness.  LUNGS: Normal breath sounds bilaterally, no wheezing, rales,rhonchi  No use of accessory muscles of respiration.  CARDIOVASCULAR: S1, S2 normal. No murmurs, rubs, or gallops.  ABDOMEN: Soft, non-tender, non-distended. Bowel sounds present. No organomegaly or mass.  EXTREMITIES: No pedal edema, cyanosis, or clubbing.  PSYCHIATRIC: The patient is alert and oriented x 3.  SKIN: No obvious rash, lesion, or ulcer.  Left ankle with some tenderness and mild swelling he does have range of motion of the ankle however decreased positive Homans sign  DATA REVIEW:   CBC  Recent Labs Lab 11/07/16 0749  WBC 6.8  HGB 12.7*  HCT 37.5*  PLT 277    Chemistries   Recent Labs Lab 11/06/16 1819 11/07/16 0749  NA 140 141  K 3.9 4.4  CL 105 106  CO2 30 31  GLUCOSE 85 88  BUN 11 13  CREATININE 1.03 1.14  CALCIUM 8.7* 8.5*  AST 29  --   ALT 19  --   ALKPHOS 67  --   BILITOT 0.8  --     Cardiac Enzymes  Recent Labs Lab 11/06/16 1819  TROPONINI <0.03    Microbiology Results  @[email or web address] @  RADIOLOGY:  Dg Chest 2 View  Result Date: 11/06/2016 CLINICAL DATA:  Chest pain. EXAM: CHEST  2 VIEW COMPARISON:  11/18/2014 and multiple prior radiograph FINDINGS: The cardiomediastinal silhouette is unremarkable. An 8 mm nodular opacity overlying the mid -lower left lung is noted. There is no evidence of focal airspace disease, pulmonary edema, mass, pleural effusion, or pneumothorax. No acute bony abnormalities are identified. IMPRESSION: 8 mm nodular opacity overlying the mid -lower left lung, which may represent nipple shadow. Recommend frontal chest radiograph with nipple markers. No other significant abnormality. Electronically Signed   By: Harmon Pier  M.D.   On: 11/06/2016 19:15   Dg Tibia/fibula Left  Result Date: 11/06/2016 CLINICAL DATA:  Left lower leg pain and swelling. Initial encounter. EXAM: LEFT TIBIA AND FIBULA - 2 VIEW COMPARISON:  None. FINDINGS: There is no evidence of fracture, subluxation, or dislocation. No focal bony lesions are identified. No radiopaque foreign bodies are noted. Diffuse soft tissue swelling is noted. IMPRESSION: Soft tissue swelling without bony abnormality. Electronically Signed   By: Harmon Pier M.D.   On: 11/06/2016 19:16   Ct Angio Chest Pe W And/or Wo Contrast  Result Date: 11/06/2016 CLINICAL DATA:  Chest pain and shortness of breath. DVT diagnosed in left calf veins today. Possible left lung nodule on today's chest radiograph. EXAM: CT ANGIOGRAPHY CHEST WITH CONTRAST TECHNIQUE: Multidetector CT imaging of the chest was performed using the standard protocol during bolus administration of intravenous contrast. Multiplanar CT image reconstructions and MIPs were obtained to evaluate the vascular anatomy. CONTRAST:  75 cc intravenous Isovue 370 COMPARISON:  11/06/2016  left lower extremity ultrasound and chest radiograph. 06/01/2006 chest CT FINDINGS: Cardiovascular: Segmental pulmonary emboli within both lower lobes and the right middle lobe identified, best identified on the thin sections. RV/LV ratio is 0.86. Upper limits normal heart size noted. There is no evidence of thoracic aortic aneurysm. No pericardial effusion identified. Mediastinum/Nodes: No enlarged mediastinal, hilar, or axillary lymph nodes. Thyroid gland, trachea, and esophagus demonstrate no significant findings. Lungs/Pleura: There is no evidence of airspace disease, consolidation, nodule, mass, pleural effusion or pneumothorax. Nodular opacity identified on earlier chest radiograph is compatible with nipple. Upper Abdomen: No acute abnormality. Musculoskeletal: No chest wall abnormality. No acute or significant osseous findings. Review of the MIP  images confirms the above findings. IMPRESSION: Bilateral lower lobe and right middle lobe segmental pulmonary emboli. No CT evidence of right heart strain. Nodular opacity on earlier chest x-ray represent nipple shadow. Critical Value/emergent results were called by telephone at the time of interpretation on 11/06/2016 at 9:33 pm to [facility] , who verbally acknowledged these results. Electronically Signed   By: Harmon PierJeffrey  Hu M.D.   On: 11/06/2016 20:35   [place] Venous Img Lower Unilateral Left  Result Date: 11/06/2016 CLINICAL DATA:  41 year old male with left lower extremity pain and swelling for 3 weeks. EXAM: LEFT LOWER EXTREMITY VENOUS DOPPLER ULTRASOUND TECHNIQUE: Gray-scale sonography with graded compression, as well as color Doppler and duplex ultrasound were performed to evaluate the lower extremity deep venous systems from the level of the common femoral vein and including the common femoral, femoral, profunda femoral, popliteal and calf veins including the posterior tibial, peroneal and gastrocnemius veins when visible. The superficial great saphenous vein was also interrogated. Spectral Doppler was utilized to evaluate flow at rest and with distal augmentation maneuvers in the common femoral, femoral and popliteal veins. COMPARISON:  None. FINDINGS: Nonocclusive thrombus is identified within the left peroneal veins. No other DVT is identified within the left lower extremity. A 1.1 cm popliteal/Baker's cyst is noted. IMPRESSION: Nonocclusive DVT within the left peroneal/calf veins. Critical Value/emergent results were called by telephone at the time of interpretation on 11/06/2016 at 8:16 pm to [facility] , who verbally acknowledged these results. Electronically Signed   By: Harmon PierJeffrey  Hu M.D.   On: 11/06/2016 20:17      Current Discharge Medication List    START taking these medications   Details  !! apixaban (ELIQUIS) 5 MG TABS tablet Take 2 tablets (10 mg total) by mouth 2 (two)  times daily. Then take 5 mg twice a day Qty: 28 tablet, Refills: 0    !! apixaban (ELIQUIS) 5 MG TABS tablet Take 1 tablet (5 mg total) by mouth 2 (two) times daily. Qty: 60 tablet, Refills: 0    nicotine (NICODERM CQ - DOSED IN MG/24 HOURS) 14 mg/24hr patch Place 1 patch (14 mg total) onto the skin daily. Qty: 28 patch, Refills: 0    oxyCODONE-acetaminophen (PERCOCET/ROXICET) 5-325 MG tablet Take 1 tablet by mouth every 6 (six) hours as needed for moderate pain or severe pain. Qty: 10 tablet, Refills: 0     !! - Potential duplicate medications found. Please discuss with provider.    CONTINUE these medications which have NOT CHANGED   Details  albuterol (PROVENTIL HFA;VENTOLIN HFA) 108 (90 Base) MCG/ACT inhaler Inhale 2 puffs into the lungs every 6 (six) hours as needed for wheezing or shortness of breath. Qty: 1 Inhaler, Refills: 2          Management plans discussed with the patient and  he is in agreement. Stable for discharge home  [facility] with [facility]  CODE STATUS:     Code Status Orders        Start     Ordered   11/07/16 0129  Full code  Continuous     11/07/16 0128    Code Status History    Date Active Date Inactive Code Status Order ID Comments User Context   This patient has a current code status but no historical code status.      TOTAL TIME TAKING CARE OF THIS PATIENT: 38 minutes.    Note: This dictation was prepared with Dragon dictation along with smaller phrase technology. Any transcriptional errors that result from this process are unintentional.  Kylil Swopes M.D on 11/07/2016 at 10:24 AM  Between 7am to 6pm - Pager - [phone] After 6pm go to [email or web address] - password [email or web address]  [facility]  Office  [phone]  CC: Primary care physician; opened her clinic at [facility]

## 2016-11-07 NOTE — Progress Notes (Signed)
[facility] [place] at [facility]        David Leach was admitted to the Hospital on 11/06/2016 and Discharged  11/07/2016 and should be excused from work/school   for 8 days starting 11/06/2016 , may return to work/school without any restrictions.  Call David SaranSital Laquana Villari MD with questions.  David Leach M.D on 11/07/2016,at 8:23 AM  [facility] [place] at [facility]    Office  [phone]

## 2016-11-07 NOTE — Progress Notes (Signed)
Discharge paperwork reviewed with patient. Information regarding follow-up appointments, medications and education for all newly prescribed meds was provided, all questions answered. Eliquis coupon given to patient from care management. Peripheral IV removed, catheter intact. Heart monitor removed, returned to the nurse station. Left ankle wrapped with ace wrap per Dr. Camillia HerterMody's instructions. Transport requested via wheelchair to the lobby for discharge.

## 2016-11-07 NOTE — Plan of Care (Signed)
Problem: Pain Managment: Goal: General experience of comfort will improve Outcome: Progressing Minimal relief with IV of po pain meds per patient.  Slleping afte Hydrocodone and Benadry given.  (MD made aware of allergy and order for Benadryl given to administer prior to Hydrocodone).

## 2016-11-07 NOTE — Progress Notes (Addendum)
ANTICOAGULATION CONSULT NOTE - Initial Consult  Pharmacy Consult for apixaban Indication: DVT and PE  Allergies  Allergen Reactions  . Hydrocodone Rash   Patient Measurements: Height: 5\' 11"  (180.3 cm) Weight: 169 lb 3.2 oz (76.7 kg) IBW/kg (Calculated) : 75.3  Vital Signs: Temp: 97.4 F (36.3 C) (03/11 0744) Temp Source: Oral (03/11 0744) BP: 109/62 (03/11 0744) Pulse Rate: 73 (03/11 0744)  Labs:  Recent Labs  11/06/16 1819 11/07/16 0749  HGB 14.0 12.7*  HCT 41.7 37.5*  PLT 290 277  APTT 30  --   LABPROT 12.7  --   INR 0.95  --   HEPARINUNFRC  --  0.75*  CREATININE 1.03 1.14  CKMB 2.4  --   TROPONINI <0.03  --    Estimated Creatinine Clearance: 92.7 mL/min (by C-G formula based on SCr of 1.14 mg/dL).  Medical History: Past Medical History:  Diagnosis Date  . Asthma   . Chronic dental pain   . Chronic shoulder pain   . Headache   . Right-sided back pain    Assessment: Pharmacy consulted to dose and monitor apixaban for treatment of a DVT and PE. Patient is currently on heparin drip which is being converted to apixaban.   Plan:  Heparin drip to stop at 1000 this morning Apixaban 10 mg PO BID x 7 days followed by apixaban 5 mg PO BID. CBC to be monitored at least every 72 hours per protocol.  Cindi CarbonMary M Rella Egelston, PharmD, BCPS Clinical Pharmacist 11/07/2016,8:35 AM  Addendum: HL this morning supratherapeutic 0.75. Spoke with RN. Will go ahead and discontinue heparin drip and change to apixaban.  Cindi CarbonMary M Liliana Dang, PharmD, BCPS

## 2016-12-27 ENCOUNTER — Encounter: Payer: Self-pay | Admitting: Emergency Medicine

## 2016-12-27 ENCOUNTER — Emergency Department: Payer: Self-pay

## 2016-12-27 ENCOUNTER — Observation Stay
Admission: EM | Admit: 2016-12-27 | Discharge: 2016-12-28 | Disposition: A | Discharge status: Home / Self Care | Payer: Self-pay | Attending: Internal Medicine | Admitting: Internal Medicine

## 2016-12-27 ENCOUNTER — Observation Stay: Payer: Self-pay

## 2016-12-27 DIAGNOSIS — Z7901 Long term (current) use of anticoagulants: Secondary | ICD-10-CM | POA: Insufficient documentation

## 2016-12-27 DIAGNOSIS — J449 Chronic obstructive pulmonary disease, unspecified: Secondary | ICD-10-CM | POA: Insufficient documentation

## 2016-12-27 DIAGNOSIS — M19079 Primary osteoarthritis, unspecified ankle and foot: Secondary | ICD-10-CM

## 2016-12-27 DIAGNOSIS — Z86711 Personal history of pulmonary embolism: Secondary | ICD-10-CM | POA: Insufficient documentation

## 2016-12-27 DIAGNOSIS — M79673 Pain in unspecified foot: Secondary | ICD-10-CM | POA: Diagnosis present

## 2016-12-27 DIAGNOSIS — M79672 Pain in left foot: Secondary | ICD-10-CM

## 2016-12-27 DIAGNOSIS — R0781 Pleurodynia: Secondary | ICD-10-CM

## 2016-12-27 DIAGNOSIS — M79671 Pain in right foot: Secondary | ICD-10-CM

## 2016-12-27 DIAGNOSIS — L03119 Cellulitis of unspecified part of limb: Secondary | ICD-10-CM

## 2016-12-27 DIAGNOSIS — N289 Disorder of kidney and ureter, unspecified: Secondary | ICD-10-CM

## 2016-12-27 DIAGNOSIS — Z86718 Personal history of other venous thrombosis and embolism: Secondary | ICD-10-CM | POA: Insufficient documentation

## 2016-12-27 DIAGNOSIS — F17210 Nicotine dependence, cigarettes, uncomplicated: Secondary | ICD-10-CM | POA: Insufficient documentation

## 2016-12-27 DIAGNOSIS — M109 Gout, unspecified: Principal | ICD-10-CM | POA: Insufficient documentation

## 2016-12-27 LAB — COMPREHENSIVE METABOLIC PANEL
ALBUMIN: 3.9 g/dL (ref 3.5–5.0)
ALT: 25 U/L (ref 17–63)
AST: 32 U/L (ref 15–41)
Alkaline Phosphatase: 95 U/L (ref 38–126)
Anion gap: 7 (ref 5–15)
BUN: 18 mg/dL (ref 6–20)
CHLORIDE: 106 mmol/L (ref 101–111)
CO2: 28 mmol/L (ref 22–32)
Calcium: 9 mg/dL (ref 8.9–10.3)
Creatinine, Ser: 1.27 mg/dL — ABNORMAL HIGH (ref 0.61–1.24)
GFR calc Af Amer: >60 mL/min (ref >60)
Glucose, Bld: 82 mg/dL (ref 65–99)
POTASSIUM: 4.1 mmol/L (ref 3.5–5.1)
SODIUM: 141 mmol/L (ref 135–145)
Total Bilirubin: 1 mg/dL (ref 0.3–1.2)
Total Protein: 7.3 g/dL (ref 6.5–8.1)

## 2016-12-27 LAB — TROPONIN I: Troponin I: <0.03 ng/mL (ref <0.03)

## 2016-12-27 LAB — URINALYSIS, COMPLETE (UACMP) WITH MICROSCOPIC
BACTERIA UA: NONE SEEN
BILIRUBIN URINE: NEGATIVE
GLUCOSE, UA: NEGATIVE mg/dL
HGB URINE DIPSTICK: NEGATIVE
Ketones, ur: NEGATIVE mg/dL
LEUKOCYTES UA: NEGATIVE
NITRITE: NEGATIVE
PROTEIN: NEGATIVE mg/dL
RBC / HPF: NONE SEEN RBC/hpf (ref 0–5)
Specific Gravity, Urine: 1.025 (ref 1.005–1.030)
Squamous Epithelial / LPF: NONE SEEN
WBC UA: NONE SEEN WBC/hpf (ref 0–5)
pH: 5 (ref 5.0–8.0)

## 2016-12-27 LAB — CBC
HCT: 45.9 % (ref 40.0–52.0)
Hemoglobin: 15.1 g/dL (ref 13.0–18.0)
MCH: 29.2 pg (ref 26.0–34.0)
MCHC: 33 g/dL (ref 32.0–36.0)
MCV: 88.7 fL (ref 80.0–100.0)
PLATELETS: 291 10*3/uL (ref 150–440)
RBC: 5.18 MIL/uL (ref 4.40–5.90)
RDW: 15.2 % — AB (ref 11.5–14.5)
WBC: 9.9 10*3/uL (ref 3.8–10.6)

## 2016-12-27 LAB — PROTIME-INR
INR: 1.14
PROTHROMBIN TIME: 14.7 s (ref 11.4–15.2)

## 2016-12-27 LAB — APTT: aPTT: 31 seconds (ref 24–36)

## 2016-12-27 LAB — URIC ACID: URIC ACID, SERUM: 9.3 mg/dL — AB (ref 4.4–7.6)

## 2016-12-27 MED ORDER — MORPHINE SULFATE (PF) 2 MG/ML IV SOLN
2.0000 mg | Freq: Once | INTRAVENOUS | Status: AC
  Administered 2016-12-27: 2 mg via INTRAVENOUS
  Filled 2016-12-27: qty 1

## 2016-12-27 MED ORDER — KETOROLAC TROMETHAMINE 30 MG/ML IJ SOLN
30.0000 mg | Freq: Once | INTRAMUSCULAR | Status: AC
  Administered 2016-12-27: 30 mg via INTRAVENOUS
  Filled 2016-12-27: qty 1

## 2016-12-27 MED ORDER — RIVAROXABAN 20 MG PO TABS
20.0000 mg | ORAL_TABLET | Freq: Every day | ORAL | Status: DC
  Administered 2016-12-27: 20 mg via ORAL
  Filled 2016-12-27: qty 1

## 2016-12-27 MED ORDER — CEFAZOLIN SODIUM-DEXTROSE 1-4 GM/50ML-% IV SOLN
1.0000 g | Freq: Once | INTRAVENOUS | Status: AC
  Administered 2016-12-27: 1 g via INTRAVENOUS
  Filled 2016-12-27: qty 50

## 2016-12-27 MED ORDER — ONDANSETRON HCL 4 MG/2ML IJ SOLN: 4.0000 mg | Freq: Four times a day (QID) | INTRAMUSCULAR | Status: DC | PRN

## 2016-12-27 MED ORDER — MORPHINE SULFATE (PF) 4 MG/ML IV SOLN
4.0000 mg | INTRAVENOUS | Status: DC | PRN
  Administered 2016-12-27 – 2016-12-28 (×4): 4 mg via INTRAVENOUS
  Filled 2016-12-27 (×5): qty 1

## 2016-12-27 MED ORDER — PREDNISONE 50 MG PO TABS
50.0000 mg | ORAL_TABLET | Freq: Every day | ORAL | Status: DC
  Administered 2016-12-28: 50 mg via ORAL
  Filled 2016-12-27: qty 1

## 2016-12-27 MED ORDER — TRAMADOL HCL 50 MG PO TABS
ORAL_TABLET | ORAL | Status: AC
  Administered 2016-12-27: 50 mg via ORAL
  Filled 2016-12-27: qty 1

## 2016-12-27 MED ORDER — ACETAMINOPHEN 325 MG PO TABS: 650.0000 mg | ORAL_TABLET | Freq: Four times a day (QID) | ORAL | Status: DC | PRN

## 2016-12-27 MED ORDER — OXYCODONE HCL 5 MG PO TABS
5.0000 mg | ORAL_TABLET | ORAL | Status: DC | PRN
  Administered 2016-12-28: 5 mg via ORAL
  Filled 2016-12-27 (×2): qty 1

## 2016-12-27 MED ORDER — SODIUM CHLORIDE 0.9 % IV SOLN
INTRAVENOUS | Status: DC
  Administered 2016-12-27 – 2016-12-28 (×2): via INTRAVENOUS

## 2016-12-27 MED ORDER — ALBUTEROL SULFATE HFA 108 (90 BASE) MCG/ACT IN AERS: 2.0000 | INHALATION_SPRAY | Freq: Four times a day (QID) | RESPIRATORY_TRACT | Status: DC | PRN

## 2016-12-27 MED ORDER — NICOTINE 14 MG/24HR TD PT24
14.0000 mg | MEDICATED_PATCH | Freq: Every day | TRANSDERMAL | Status: DC
  Administered 2016-12-27: 14 mg via TRANSDERMAL
  Filled 2016-12-27 (×2): qty 1

## 2016-12-27 MED ORDER — ONDANSETRON HCL 4 MG PO TABS: 4.0000 mg | ORAL_TABLET | Freq: Four times a day (QID) | ORAL | Status: DC | PRN

## 2016-12-27 MED ORDER — ACETAMINOPHEN 650 MG RE SUPP: 650.0000 mg | Freq: Four times a day (QID) | RECTAL | Status: DC | PRN

## 2016-12-27 MED ORDER — ALBUTEROL SULFATE (2.5 MG/3ML) 0.083% IN NEBU: 2.5000 mg | INHALATION_SOLUTION | Freq: Four times a day (QID) | RESPIRATORY_TRACT | Status: DC | PRN

## 2016-12-27 MED ORDER — METHYLPREDNISOLONE SODIUM SUCC 125 MG IJ SOLR
60.0000 mg | Freq: Once | INTRAMUSCULAR | Status: AC
  Administered 2016-12-27: 60 mg via INTRAVENOUS
  Filled 2016-12-27: qty 2

## 2016-12-27 MED ORDER — TRAMADOL HCL 50 MG PO TABS
50.0000 mg | ORAL_TABLET | Freq: Once | ORAL | Status: AC
  Administered 2016-12-27: 50 mg via ORAL

## 2016-12-27 MED ORDER — OXYCODONE-ACETAMINOPHEN 5-325 MG PO TABS
1.0000 | ORAL_TABLET | Freq: Four times a day (QID) | ORAL | Status: DC | PRN
  Administered 2016-12-27: 1 via ORAL
  Filled 2016-12-27: qty 1

## 2016-12-27 NOTE — Progress Notes (Signed)
Called Dr. Winona Legato about patient still having lots of pain in his feet and the Percocet wasn't working.  I gave her the patient's uric acid results which were elevated and she gave me a verbal order for Morphine and Oxycodone.

## 2016-12-27 NOTE — ED Provider Notes (Signed)
[facility] Emergency Department Provider Note   ____________________________________________    I have reviewed the triage vital signs and the nursing notes.   HISTORY  Chief Complaint Leg Swelling     HPI David Leach is a 41 y.o. male who presents with complaints of bilateral lower extremity swelling and pain. He reports this started last night. He reports primarily pain in his feet on both sides which is where the majority of the swelling is however he reports pain traveling up both calfs as well. Recent diagnosis earlier this year of blood clots and he is on Xarelto, reports compliance with his medication. Also reports he has been out "in the woods" and may been exposed to insects, he has been itching primarily in his lower cavities. He also describes a rash on his right chest. No fevers or chills. No shortness of breath. Denies drug use   Past Medical History:  Diagnosis Date  . Asthma   . Chronic dental pain   . Chronic shoulder pain   . Headache   . Right-sided back pain     Patient Active Problem List   Diagnosis Date Noted  . Pulmonary embolism (HCC) 11/06/2016  . DVT (deep venous thrombosis) (HCC) 11/06/2016  . Asthma 11/06/2016    Past Surgical History:  Procedure Laterality Date  . TONSILLECTOMY      Prior to Admission medications   Medication Sig Start Date End Date Taking? Authorizing Provider  albuterol (PROVENTIL HFA;VENTOLIN HFA) 108 (90 Base) MCG/ACT inhaler Inhale 2 puffs into the lungs every 6 (six) hours as needed for wheezing or shortness of breath. 12/16/15  Yes Charmayne Sheer Beers, PA-C  rivaroxaban (XARELTO) 20 MG TABS tablet Take 20 mg by mouth every evening. 11/29/16 11/29/17 Yes Historical Provider, MD  apixaban (ELIQUIS) 5 MG TABS tablet Take 2 tablets (10 mg total) by mouth 2 (two) times daily. Then take 5 mg twice a day Patient not taking: Reported on 12/27/2016 11/07/16 11/14/16  Adrian Saran, MD  apixaban (ELIQUIS) 5  MG TABS tablet Take 1 tablet (5 mg total) by mouth 2 (two) times daily. Patient not taking: Reported on 12/27/2016 11/14/16   Adrian Saran, MD  nicotine (NICODERM CQ - DOSED IN MG/24 HOURS) 14 mg/24hr patch Place 1 patch (14 mg total) onto the skin daily. Patient not taking: Reported on 12/27/2016 11/07/16   Adrian Saran, MD  oxyCODONE-acetaminophen (PERCOCET/ROXICET) 5-325 MG tablet Take 1 tablet by mouth every 6 (six) hours as needed for moderate pain or severe pain. Patient not taking: Reported on 12/27/2016 11/07/16   Adrian Saran, MD     Allergies Hydrocodone  Family History  Problem Relation Age of Onset  . Cancer Mother   . Diabetes Father   . Cancer Other   . Stroke Other   . Diabetes Other     Social History Social History  Substance Use Topics  . Smoking status: Current Every Day Smoker    Packs/day: 1.00    Years: 20.00    Types: Cigarettes  . Smokeless tobacco: Former Neurosurgeon    Types: Chew  . Alcohol use Yes     Comment: occ    Review of Systems  Constitutional: No fever/chills Eyes: No visual changes.  ENT: No sore throat. Cardiovascular: Denies chest pain. Respiratory: Denies shortness of breath. Gastrointestinal: No abdominal pain.  No nausea, no vomiting.   Genitourinary: Negative for dysuria. Musculoskeletal: As above Skin: As above Neurological: Negative for headaches or weakness  ____________________________________________   PHYSICAL EXAM:  VITAL SIGNS: ED Triage Vitals  Enc Vitals Group     BP 12/27/16 0847 136/82     Pulse Rate 12/27/16 0847 98     Resp 12/27/16 0847 16     Temp 12/27/16 0847 98.8 F (37.1 C)     Temp Source 12/27/16 0847 Oral     SpO2 12/27/16 0847 100 %     Weight 12/27/16 0831 169 lb (76.7 kg)     Height 12/27/16 0836  (1.803 m)     Head Circumference --      Peak Flow --      Pain Score 12/27/16 0830 10     Pain Loc --      Pain Edu? --      Excl. in GC? --     Constitutional: Alert and oriented. No acute  distress. Pleasant and interactive Eyes: Conjunctivae are normal.   Nose: No congestion/rhinnorhea. Mouth/Throat: Mucous membranes are moist.    Cardiovascular: Normal rate, regular rhythm. Grossly normal heart sounds.  Good peripheral circulation. Respiratory: Normal respiratory effort.  No retractions. Lungs CTAB. Gastrointestinal: Soft and nontender. No distention.  No CVA tenderness. Genitourinary: deferred Musculoskeletal:See below.  Warm and well perfused Neurologic:  Normal speech and language. No gross focal neurologic deficits are appreciated.  Skin:  Skin is warm, dry and intact. Approximately 6 x 6 cm erythematous mildly raised rash on the right lateral chest wall, no evidence of other rashes or herpetic appearance. Both feet are erythematous and swollen. Warm and well perfused. No significant swelling of the calves Psychiatric: Mood and affect are normal. Speech and behavior are normal.  ____________________________________________   LABS (all labs ordered are listed, but only abnormal results are displayed)  Labs Reviewed  CBC - Abnormal; Notable for the following:       Result Value   RDW 15.2 (*)    All other components within normal limits  COMPREHENSIVE METABOLIC PANEL - Abnormal; Notable for the following:    Creatinine, Ser 1.27 (*)    All other components within normal limits  CULTURE, BLOOD (ROUTINE X 2)  CULTURE, BLOOD (ROUTINE X 2)  PROTIME-INR  APTT   ____________________________________________  EKG  None ____________________________________________  RADIOLOGY  Ultrasound ____________________________________________   PROCEDURES  Procedure(s) performed: No    Critical Care performed: No ____________________________________________   INITIAL IMPRESSION / ASSESSMENT AND PLAN / ED COURSE  Pertinent labs & imaging results that were available during my care of the patient were reviewed by me and considered in my medical decision making  (see chart for details).  Patient presents with complaints of bilateral swelling primarily in his feet with pain. Recent diagnosis of blood clots on blood thinners. Also with a rash in his right lateral chest of unclear origin.  ----------------------------------------- 10:10 AM on 12/27/2016 -----------------------------------------  Patient is upset that he was given by mouth tramadol for pain. I discussed with him that he we are working to determine the origin of his foot pain but I do not think narcotics are appropriate at this time, I will order IV Toradol.   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  ----------------------------------------- 12:52 PM on 12/27/2016 -----------------------------------------  Ultrasound is unremarkable. Patient remains with a swollen erythematous feet bilaterally he is unable to ambulate. I will admit him for further evaluation, suspect cellulitis although is unusual to be bilateral. I'll start him on Ancef    ____________________________________________   FINAL CLINICAL IMPRESSION(S) /  ED DIAGNOSES  Final diagnoses:  Cellulitis of lower extremity, unspecified laterality      NEW MEDICATIONS STARTED DURING THIS VISIT:  New Prescriptions   No medications on file     Note:  This document was prepared using Dragon voice recognition software and may include unintentional dictation errors.    Jene Every, MD 12/27/16 [phone]

## 2016-12-27 NOTE — ED Notes (Signed)
Pt reports ome chest pain, MD aware and EKG performed. No acute distress noted.

## 2016-12-27 NOTE — ED Triage Notes (Signed)
bitlateral leg and feet swelling with pain started last night. Pt with hx of DVT.

## 2016-12-27 NOTE — H&P (Addendum)
[facility] - [place] at [facility]   PATIENT NAME: David Leach    MR#:  [id number]  DATE OF BIRTH:  31-Jan-1976  DATE OF ADMISSION:  12/27/2016  PRIMARY CARE PHYSICIAN: Pcp Not In System   REQUESTING/REFERRING PHYSICIAN:   CHIEF COMPLAINT:   Chief Complaint  Patient presents with  . Leg Swelling    HISTORY OF PRESENT ILLNESS: David Leach  is a 41 y.o. male with a known history of Pulmonary embolism, DVT, COPD, who presents to the hospital with complaints of bilateral lower extremity, mostly feet pain, swelling, which started yesterday. Apparently the patient, he was doing well up until yesterday when he started having significant bilateral feet pain, he cannot even walk, he felt that he has developed also some redness on the bottom of the feet. Pain is so significant that he cannot even touch his feet. He presents to the hospital for further evaluation and treatment, where he was noted to have a little bit more swelling in left lower extremity and on the right, ultrasound of bilateral lower extremity is revealed nonocclusive thrombus in the left peroneal area, stable from before. Patient is also complaining of sharp pains in the chest on the left side of the chest, especially with deep breathing, which is nothing new, going on for the past 1 months. He's been using Xarelto. Admits of feeling somewhat chilly week and the because he was not able to walk due to pain, hospitalist services were contacted for admission  PAST MEDICAL HISTORY:   Past Medical History:  Diagnosis Date  . Asthma   . Chronic dental pain   . Chronic shoulder pain   . Headache   . Right-sided back pain     PAST SURGICAL HISTORY: Past Surgical History:  Procedure Laterality Date  . TONSILLECTOMY      SOCIAL HISTORY:  Social History  Substance Use Topics  . Smoking status: Current Every Day Smoker    Packs/day: 1.00    Years: 20.00    Types: Cigarettes  . Smokeless tobacco:  Former Neurosurgeon    Types: Chew  . Alcohol use Yes     Comment: occ    FAMILY HISTORY:  Family History  Problem Relation Age of Onset  . Cancer Mother   . Diabetes Father   . Cancer Other   . Stroke Other   . Diabetes Other     DRUG ALLERGIES:  Allergies  Allergen Reactions  . Hydrocodone Rash    Review of Systems  Constitutional: Positive for chills. Negative for fever and weight loss.  HENT: Positive for congestion.   Eyes: Positive for blurred vision. Negative for double vision.  Respiratory: Positive for wheezing. Negative for cough, sputum production and shortness of breath.   Cardiovascular: Positive for chest pain and leg swelling. Negative for palpitations, orthopnea and PND.  Gastrointestinal: Negative for abdominal pain, blood in stool, constipation, diarrhea, nausea and vomiting.  Genitourinary: Negative for dysuria, frequency, hematuria and urgency.  Musculoskeletal: Positive for joint pain. Negative for falls.  Neurological: Positive for weakness. Negative for dizziness, tremors, focal weakness and headaches.  Endo/Heme/Allergies: Does not bruise/bleed easily.  Psychiatric/Behavioral: Negative for depression. The patient does not have insomnia.     MEDICATIONS AT HOME:  Prior to Admission medications   Medication Sig Start Date End Date Taking? Authorizing Provider  albuterol (PROVENTIL HFA;VENTOLIN HFA) 108 (90 Base) MCG/ACT inhaler Inhale 2 puffs into the lungs every 6 (six) hours as needed for wheezing or shortness of  breath. 12/16/15  Yes Charmayne Sheer Beers, PA-C  rivaroxaban (XARELTO) 20 MG TABS tablet Take 20 mg by mouth every evening. 11/29/16 11/29/17 Yes Historical Provider, MD  apixaban (ELIQUIS) 5 MG TABS tablet Take 2 tablets (10 mg total) by mouth 2 (two) times daily. Then take 5 mg twice a day Patient not taking: Reported on 12/27/2016 11/07/16 11/14/16  Adrian Saran, MD  apixaban (ELIQUIS) 5 MG TABS tablet Take 1 tablet (5 mg total) by mouth 2 (two) times  daily. Patient not taking: Reported on 12/27/2016 11/14/16   Adrian Saran, MD  nicotine (NICODERM CQ - DOSED IN MG/24 HOURS) 14 mg/24hr patch Place 1 patch (14 mg total) onto the skin daily. Patient not taking: Reported on 12/27/2016 11/07/16   Adrian Saran, MD  oxyCODONE-acetaminophen (PERCOCET/ROXICET) 5-325 MG tablet Take 1 tablet by mouth every 6 (six) hours as needed for moderate pain or severe pain. Patient not taking: Reported on 12/27/2016 11/07/16   Adrian Saran, MD      PHYSICAL EXAMINATION:   VITAL SIGNS: Blood pressure 102/68, pulse 72, temperature 98.8 F (37.1 C), temperature source Oral, resp. rate 16, height  (1.803 m), weight 80.7 kg (178 lb), SpO2 99 %.  GENERAL:  41 y.o.-year-old patient lying in the bed In moderate distress due to significant bilateral feet pain. Marland Kitchen  EYES: Pupils equal, round, reactive to light and accommodation. No scleral icterus. Extraocular muscles intact.  HEENT: Head atraumatic, normocephalic. Oropharynx and nasopharynx clear.  NECK:  Supple, no jugular venous distention. No thyroid enlargement, no tenderness.  LUNGS: Some diminished breath sounds bilaterally, scattered wheezing, no rales,rhonchi or crepitation. Intermittent use of accessory muscles of respiration.  CARDIOVASCULAR: S1, S2 . Rhythm was regular. No murmurs, rubs, or gallops.  ABDOMEN: Soft, nontender, nondistended. Bowel sounds present. No organomegaly or mass.  EXTREMITIES: Trace lower extremity and pedal edema, more pronounced on the left, no cyanosis, or clubbing. Erythematous heels bilaterally, significant tenderness on palpation of bilateral feet and ankles, even with her slightest touch, no increased warmth was noted NEUROLOGIC: Cranial nerves II through XII are intact. Muscle strength 5/5 in all extremities. Sensation intact. Gait not checked.  PSYCHIATRIC: The patient is alert and oriented x 3.  SKIN: No obvious rash, lesion, or ulcer.   LABORATORY PANEL:   CBC  Recent Labs Lab  12/27/16 0854  WBC 9.9  HGB 15.1  HCT 45.9  PLT 291  MCV 88.7  MCH 29.2  MCHC 33.0  RDW 15.2*   ------------------------------------------------------------------------------------------------------------------  Chemistries   Recent Labs Lab 12/27/16 0854  NA 141  K 4.1  CL 106  CO2 28  GLUCOSE 82  BUN 18  CREATININE 1.27*  CALCIUM 9.0  AST 32  ALT 25  ALKPHOS 95  BILITOT 1.0   ------------------------------------------------------------------------------------------------------------------  Cardiac Enzymes No results for input(s): TROPONINI in the last 168 hours. ------------------------------------------------------------------------------------------------------------------  RADIOLOGY: US Venous Img Lower Bilateral  Result Date: 12/27/2016 CLINICAL DATA:  Swelling, pain.  History of DVT. EXAM: BILATERAL LOWER EXTREMITY VENOUS DOPPLER ULTRASOUND TECHNIQUE: Gray-scale sonography with graded compression, as well as color Doppler and duplex ultrasound were performed to evaluate the lower extremity deep venous systems from the level of the common femoral vein and including the common femoral, femoral, profunda femoral, popliteal and calf veins including the posterior tibial, peroneal and gastrocnemius veins when visible. The superficial great saphenous vein was also interrogated. Spectral Doppler was utilized to evaluate flow at rest and with distal augmentation maneuvers in the common femoral, femoral and popliteal veins.  COMPARISON:  None. FINDINGS: RIGHT LOWER EXTREMITY Common Femoral Vein: No evidence of thrombus. Normal compressibility, respiratory phasicity and response to augmentation. Saphenofemoral Junction: No evidence of thrombus. Normal compressibility and flow on color Doppler imaging. Profunda Femoral Vein: No evidence of thrombus. Normal compressibility and flow on color Doppler imaging. Femoral Vein: No evidence of thrombus. Normal compressibility, respiratory  phasicity and response to augmentation. Popliteal Vein: No evidence of thrombus. Normal compressibility, respiratory phasicity and response to augmentation. Calf Veins: No evidence of thrombus. Normal compressibility and flow on color Doppler imaging. Superficial Great Saphenous Vein: No evidence of thrombus. Normal compressibility and flow on color Doppler imaging. Venous Reflux:  None. Other Findings:  None. LEFT LOWER EXTREMITY Common Femoral Vein: No evidence of thrombus. Normal compressibility, respiratory phasicity and response to augmentation. Saphenofemoral Junction: No evidence of thrombus. Normal compressibility and flow on color Doppler imaging. Profunda Femoral Vein: No evidence of thrombus. Normal compressibility and flow on color Doppler imaging. Femoral Vein: No evidence of thrombus. Normal compressibility, respiratory phasicity and response to augmentation. Popliteal Vein: No evidence of thrombus. Normal compressibility, respiratory phasicity and response to augmentation. Calf Veins: Nonocclusive thrombus noted within the left peroneal veins, similar to prior study. Superficial Great Saphenous Vein: No evidence of thrombus. Normal compressibility and flow on color Doppler imaging. Venous Reflux:  None. Other Findings:  None. IMPRESSION: Continued nonocclusive thrombus within the left peroneal veins in the left calf. Otherwise no additional lower extremity deep venous thrombosis. Electronically Signed   By: Charlett Nose M.D.   On: 12/27/2016 11:05    EKG: Orders placed or performed during the hospital encounter of 12/27/16  . EKG 12-Lead  . EKG 12-Lead  , It EKG in emergency room reveals sinus rhythm at 95 bpm RSR prime in V1, V2, no acute ST-T changes   IMPRESSION AND PLAN:  Active Problems:   Arthritis of foot   Foot pain, bilateral   Pleuritic chest pain   Acute renal insufficiency   Pain of foot #1. Bilateral feet pain of unclear etiology, suspected gouty arthritis or tendinitis,  get the uric acid level, x-rays of both feet, get physical therapist involved, initiate patient medications, discussed with emergency room MDs #2. Pleuritic chest pain due to pulmonary embolism, supportive therapy, oxygen therapy as needed, check O2 sats on exertion on room air #3. Acute renal insufficiency, initiate IV fluids, follow creatinine in the morning, get urinalysis #4. Tobacco abuse. Counseling, discussed this patient for 4 minutes, nicotine replacement therapy will be initiated, he is agreeable All the records are reviewed and case discussed with ED provider. Management plans discussed with the patient, family and they are in agreement.  CODE STATUS: Code Status History    Date Active Date Inactive Code Status Order ID Comments User Context   11/07/2016  1:28 AM 11/07/2016  2:48 PM Full Code [id number]  Oralia Manis, MD Inpatient       TOTAL TIME TAKING CARE OF THIS PATIENT: 50 minutes.    Katharina Caper M.D on 12/27/2016 at 3:01 PM  Between 7am to 6pm - Pager - [phone] After 6pm go to [email or web address] - password [email or web address]  [facility]  Office  [phone]  CC: Primary care physician; Pcp Not In System

## 2016-12-27 NOTE — ED Notes (Signed)
Pt complaining of chest pain. EKG performed per Tiffany.

## 2016-12-27 NOTE — ED Notes (Signed)
Pt brought to room 14. Pt informed to remove all clothes except underwear for doctor. Vitals taken and warm blanket given to pt. RN notified pt in room.

## 2016-12-28 LAB — CBC
HEMATOCRIT: 40.2 % (ref 40.0–52.0)
Hemoglobin: 13.3 g/dL (ref 13.0–18.0)
MCH: 29.4 pg (ref 26.0–34.0)
MCHC: 33 g/dL (ref 32.0–36.0)
MCV: 89.1 fL (ref 80.0–100.0)
PLATELETS: 241 10*3/uL (ref 150–440)
RBC: 4.51 MIL/uL (ref 4.40–5.90)
RDW: 15 % — AB (ref 11.5–14.5)
WBC: 5.3 10*3/uL (ref 3.8–10.6)

## 2016-12-28 LAB — HIV ANTIBODY (ROUTINE TESTING W REFLEX): HIV Screen 4th Generation wRfx: NONREACTIVE

## 2016-12-28 LAB — BASIC METABOLIC PANEL
Anion gap: 3 — ABNORMAL LOW (ref 5–15)
BUN: 18 mg/dL (ref 6–20)
CHLORIDE: 108 mmol/L (ref 101–111)
CO2: 28 mmol/L (ref 22–32)
Calcium: 8.7 mg/dL — ABNORMAL LOW (ref 8.9–10.3)
Creatinine, Ser: 0.97 mg/dL (ref 0.61–1.24)
GFR calc non Af Amer: >60 mL/min (ref >60)
Glucose, Bld: 146 mg/dL — ABNORMAL HIGH (ref 65–99)
POTASSIUM: 5.1 mmol/L (ref 3.5–5.1)
SODIUM: 139 mmol/L (ref 135–145)

## 2016-12-28 LAB — TROPONIN I: Troponin I: <0.03 ng/mL (ref <0.03)

## 2016-12-28 LAB — GLUCOSE, CAPILLARY: GLUCOSE-CAPILLARY: 186 mg/dL — AB (ref 65–99)

## 2016-12-28 MED ORDER — ALLOPURINOL 100 MG PO TABS: 100.0000 mg | ORAL_TABLET | Freq: Every day | ORAL | 0 refills | Status: DC

## 2016-12-28 MED ORDER — PREDNISONE 50 MG PO TABS: 50.0000 mg | ORAL_TABLET | Freq: Every day | ORAL | 0 refills | Status: AC

## 2016-12-28 MED ORDER — OXYCODONE HCL 5 MG PO TABS: 5.0000 mg | ORAL_TABLET | Freq: Four times a day (QID) | ORAL | 0 refills | Status: DC | PRN

## 2016-12-28 NOTE — Progress Notes (Signed)
Discharged via wheelchair escorted by auxilary.

## 2016-12-28 NOTE — Evaluation (Signed)
Physical Therapy Evaluation Patient Details Name: David Leach MRN: [id number] DOB: 1975-09-07 Today's Date: 12/28/2016   History of Present Illness  41 y.o. male with a known history of Pulmonary embolism, DVT, COPD, who presents to the hospital with complaints of bilateral lower extremity, mostly feet pain, swelling.  He reports he had been hurting so bad that he was even having to crawl because any pressure through soles of feet was intolerable.  Clinical Impression  Pt reports that pain is minimally better but still severe; 8/10 at rest, 10/10 during ambulation.  He had good upper body strength and was able to use walker to unweight LEs and managed to walk 100 ft w/o safety issue (apart from significant pain).  He was generally independent with all mobility and apart from pain is doing well.     Follow Up Recommendations No PT follow up    Equipment Recommendations  Rolling walker with 5" wheels    Recommendations for Other Services       Precautions / Restrictions Precautions Precautions: Fall Restrictions Weight Bearing Restrictions: No      Mobility  Bed Mobility Overal bed mobility: Independent                Transfers Overall transfer level: Independent Equipment used: Rolling walker (2 wheeled)             General transfer comment: Pt able to rise, just hesitant with WBing, was able to maintain static standing balance w/o AD  Ambulation/Gait Ambulation/Gait assistance: Modified independent (Device/Increase time) Ambulation Distance (Feet): 100 Feet Assistive device: Rolling walker (2 wheeled)       General Gait Details: Pt was able to ambulate well with walker but is highly reliant on the walker to unweight LEs.  Overall was safe and able to manage but clearly in pain t/o the effort and essentially using UEs to take most of the weight off feet.  Stairs            Wheelchair Mobility    Modified Rankin (Stroke Patients Only)        Balance Overall balance assessment: Independent                                           Pertinent Vitals/Pain      Home Living Family/patient expects to be discharged to:: Private residence Living Arrangements: Non-relatives/Friends Available Help at Discharge: Friend(s)   Home Access: Stairs to enter Entrance Stairs-Rails: None Secretary/administrator of Steps: 2   Home Equipment:  (he's pretty sure his room mates have a walker?)      Prior Function Level of Independence: Independent         Comments: Pt working as Education administrator, Manufacturing engineer, 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     Hand Dominance        Extremity/Trunk Assessment   Upper Extremity Assessment Upper Extremity Assessment: Overall WFL for tasks assessed    Lower Extremity Assessment Lower Extremity Assessment: Overall WFL for tasks assessed (just hypersensitive with any dorsal foot pressure)       Communication   Communication: No difficulties  Cognition Arousal/Alertness: Awake/alert Behavior During Therapy: WFL for tasks assessed/performed Overall Cognitive Status: Within Functional Limits for tasks assessed  General Comments      Exercises     Assessment/Plan    PT Assessment Patent does not need any further PT services  PT Problem List Pain;Decreased activity tolerance       PT Treatment Interventions Functional mobility training;Therapeutic activities;Therapeutic exercise;Gait training;Balance training;Patient/family education    PT Goals (Current goals can be found in the Care Plan section)  Acute Rehab PT Goals Patient Stated Goal: get back to working PT Goal Formulation: With patient Time For Goal Achievement: 01/11/17 Potential to Achieve Goals: Good    Frequency     Barriers to discharge        Co-evaluation               AM-PAC PT "6 Clicks" Daily Activity  Outcome Measure Difficulty turning over  in bed (including adjusting bedclothes, sheets and blankets)?: None Difficulty moving from lying on back to sitting on the side of the bed? : None Difficulty sitting down on and standing up from a chair with arms (e.g., wheelchair, bedside commode, etc,.)?: None Help needed moving to and from a bed to chair (including a wheelchair)?: A Little Help needed walking in hospital room?: A Little Help needed climbing 3-5 steps with a railing? : A Little 6 Click Score: 21    End of Session Equipment Utilized During Treatment: Gait belt Activity Tolerance: Patient limited by pain Patient left: with chair alarm set;with call bell/phone within reach Nurse Communication: Mobility status PT Visit Diagnosis: Pain;Difficulty in walking, not elsewhere classified (R26.2) Pain - Right/Left:  (bilateral) Pain - part of body: Ankle and joints of foot    Time: 0813-0829 PT Time Calculation (min) (ACUTE ONLY): 16 min   Charges:   PT Evaluation $PT Eval Low Complexity: 1 Procedure     PT G Codes:   PT G-Codes **NOT FOR INPATIENT CLASS** Functional Assessment Tool Used: AM-PAC 6 Clicks Basic Mobility Functional Limitation: Mobility: Walking and moving around Mobility: Walking and Moving Around Current Status (U9811): At least 1 percent but less than 20 percent impaired, limited or restricted Mobility: Walking and Moving Around Goal Status [phone]): At least 1 percent but less than 20 percent impaired, limited or restricted Mobility: Walking and Moving Around Discharge Status [phone]): At least 1 percent but less than 20 percent impaired, limited or restricted    Malachi Pro, DPT 12/28/2016, 10:42 AM

## 2016-12-28 NOTE — Care Management (Signed)
Patient admitted for bilateral foot pain.  Patient is self pay and does not have PCP.  Patient provided with application to [facility] and Medication Management.  Patient to discharge with pain medication and Prednisone.  Prednisone $5 out of pocket cost at [employer].  PT has assessed patient and recommended RW.  Patient declines delivery of RW.  Patient states that one of his Room mates has one that he can use.  RNCM signing off

## 2016-12-28 NOTE — Discharge Summary (Signed)
[facility] - [place] at [facility]   PATIENT NAME: David Leach    MR#:  [id number]  DATE OF BIRTH:  03/16/1976  DATE OF ADMISSION:  12/27/2016 ADMITTING PHYSICIAN: Katharina Caper, MD  DATE OF DISCHARGE: 12/28/2016  PRIMARY CARE PHYSICIAN: Pcp Not In System    ADMISSION DIAGNOSIS:  Cellulitis of lower extremity, unspecified laterality [L03.119]  DISCHARGE DIAGNOSIS:  Active Problems:   Arthritis of foot   Foot pain, bilateral   Pleuritic chest pain   Acute renal insufficiency   Pain of foot   SECONDARY DIAGNOSIS:   Past Medical History:  Diagnosis Date  . Asthma   . Chronic dental pain   . Chronic shoulder pain   . Headache   . Right-sided back pain     HOSPITAL COURSE:   Came with b/l foot pain, No new DVT found on Doppler. Uric acid was high- treated with steroids for Gout.  felt better. D/c home.  DISCHARGE CONDITIONS:   Stable.  CONSULTS OBTAINED:    DRUG ALLERGIES:   Allergies  Allergen Reactions  . Hydrocodone Rash    DISCHARGE MEDICATIONS:   Current Discharge Medication List    START taking these medications   Details  allopurinol (ZYLOPRIM) 100 MG tablet Take 1 tablet (100 mg total) by mouth daily. Qty: 20 tablet, Refills: 0    oxyCODONE (OXY IR/ROXICODONE) 5 MG immediate release tablet Take 1 tablet (5 mg total) by mouth every 6 (six) hours as needed for moderate pain or severe pain. Qty: 20 tablet, Refills: 0    predniSONE (DELTASONE) 50 MG tablet Take 1 tablet (50 mg total) by mouth daily with breakfast. Qty: 3 tablet, Refills: 0      CONTINUE these medications which have NOT CHANGED   Details  albuterol (PROVENTIL HFA;VENTOLIN HFA) 108 (90 Base) MCG/ACT inhaler Inhale 2 puffs into the lungs every 6 (six) hours as needed for wheezing or shortness of breath. Qty: 1 Inhaler, Refills: 2    rivaroxaban (XARELTO) 20 MG TABS tablet Take 20 mg by mouth every evening.      STOP taking these medications      apixaban (ELIQUIS) 5 MG TABS tablet      apixaban (ELIQUIS) 5 MG TABS tablet      nicotine (NICODERM CQ - DOSED IN MG/24 HOURS) 14 mg/24hr patch      oxyCODONE-acetaminophen (PERCOCET/ROXICET) 5-325 MG tablet          DISCHARGE INSTRUCTIONS:    Follow with rheumatology clinic in 1-2 weeks.  follow with PMD in 1-2 weeks.  If you experience worsening of your admission symptoms, develop shortness of breath, life threatening emergency, suicidal or homicidal thoughts you must seek medical attention immediately by calling 911 or calling your MD immediately  if symptoms less severe.  You Must read complete instructions/literature along with all the possible adverse reactions/side effects for all the Medicines you take and that have been prescribed to you. Take any new Medicines after you have completely understood and accept all the possible adverse reactions/side effects.   Please note  You were cared for by a hospitalist during your hospital stay. If you have any questions about your discharge medications or the care you received while you were in the hospital after you are discharged, you can call the unit and asked to speak with the hospitalist on call if the hospitalist that took care of you is not available. Once you are discharged, your primary care physician will handle any further medical  issues. Please note that NO REFILLS for any discharge medications will be authorized once you are discharged, as it is imperative that you return to your primary care physician (or establish a relationship with a primary care physician if you do not have one) for your aftercare needs so that they can reassess your need for medications and monitor your lab values.    Today   CHIEF COMPLAINT:   Chief Complaint  Patient presents with  . Leg Swelling    HISTORY OF PRESENT ILLNESS:  [name]  is a 41 y.o. male with a known history of Pulmonary embolism, DVT, COPD, who presents to the hospital  with complaints of bilateral lower extremity, mostly feet pain, swelling, which started yesterday. Apparently the patient, he was doing well up until yesterday when he started having significant bilateral feet pain, he cannot even walk, he felt that he has developed also some redness on the bottom of the feet. Pain is so significant that he cannot even touch his feet. He presents to the hospital for further evaluation and treatment, where he was noted to have a little bit more swelling in left lower extremity and on the right, ultrasound of bilateral lower extremity is revealed nonocclusive thrombus in the left peroneal area, stable from before. Patient is also complaining of sharp pains in the chest on the left side of the chest, especially with deep breathing, which is nothing new, going on for the past 1 months. He's been using Xarelto. Admits of feeling somewhat chilly week and the because he was not able to walk due to pain, hospitalist services were contacted for admission   VITAL SIGNS:  Blood pressure 129/76, pulse 75, temperature 97.8 F (36.6 C), temperature source Oral, resp. rate 16, height  (1.803 m), weight 79.6 kg (175 lb 8 oz), SpO2 100 %.  I/O:   Intake/Output Summary (Last 24 hours) at 12/28/16 1200 Last data filed at 12/28/16 1040  Gross per 24 hour  Intake             2658 ml  Output             1750 ml  Net              908 ml    PHYSICAL EXAMINATION:   GENERAL:  41 y.o.-year-old patient lying in the bed In moderate distress due to significant bilateral feet pain. Marland Kitchen  EYES: Pupils equal, round, reactive to light and accommodation. No scleral icterus. Extraocular muscles intact.  HEENT: Head atraumatic, normocephalic. Oropharynx and nasopharynx clear.  NECK:  Supple, no jugular venous distention. No thyroid enlargement, no tenderness.  LUNGS: Some diminished breath sounds bilaterally, scattered wheezing, no rales,rhonchi or crepitation. Intermittent use of accessory  muscles of respiration.  CARDIOVASCULAR: S1, S2 . Rhythm was regular. No murmurs, rubs, or gallops.  ABDOMEN: Soft, nontender, nondistended. Bowel sounds present. No organomegaly or mass.  EXTREMITIES: Trace lower extremity and pedal edema, more pronounced on the left, no cyanosis, or clubbing. On heels  tenderness on palpation of bilateral feet and ankles,  no increased warmth was noted NEUROLOGIC: Cranial nerves II through XII are intact. Muscle strength 5/5 in all extremities. Sensation intact. Gait not checked.  PSYCHIATRIC: The patient is alert and oriented x 3.  SKIN: No obvious rash, lesion, or ulcer.   DATA REVIEW:   CBC  Recent Labs Lab 12/28/16 0429  WBC 5.3  HGB 13.3  HCT 40.2  PLT 241    Chemistries  Recent Labs Lab 12/27/16 0854 12/28/16 0429  NA 141 139  K 4.1 5.1  CL 106 108  CO2 28 28  GLUCOSE 82 146*  BUN 18 18  CREATININE 1.27* 0.97  CALCIUM 9.0 8.7*  AST 32  --   ALT 25  --   ALKPHOS 95  --   BILITOT 1.0  --     Cardiac Enzymes  Recent Labs Lab 12/28/16 0429  TROPONINI <0.03    Microbiology Results  Results for orders placed or performed during the hospital encounter of 12/27/16  Blood culture (routine x 2)     Status: None (Preliminary result)   Collection Time: 12/27/16  1:52 PM  Result Value Ref Range Status   Specimen Description BLOOD CL  Final   Special Requests   Final    BOTTLES DRAWN AEROBIC AND ANAEROBIC Blood Culture adequate volume   Culture NO GROWTH < 24 HOURS  Final   Report Status PENDING  Incomplete  Blood culture (routine x 2)     Status: None (Preliminary result)   Collection Time: 12/27/16  2:16 PM  Result Value Ref Range Status   Specimen Description BLOOD L FA  Final   Special Requests   Final    BOTTLES DRAWN AEROBIC AND ANAEROBIC Blood Culture results may not be optimal due to an excessive volume of blood received in culture bottles   Culture NO GROWTH < 24 HOURS  Final   Report Status PENDING  Incomplete     RADIOLOGY:  US Venous Img Lower Bilateral  Result Date: 12/27/2016 CLINICAL DATA:  Swelling, pain.  History of DVT. EXAM: BILATERAL LOWER EXTREMITY VENOUS DOPPLER ULTRASOUND TECHNIQUE: Gray-scale sonography with graded compression, as well as color Doppler and duplex ultrasound were performed to evaluate the lower extremity deep venous systems from the level of the common femoral vein and including the common femoral, femoral, profunda femoral, popliteal and calf veins including the posterior tibial, peroneal and gastrocnemius veins when visible. The superficial great saphenous vein was also interrogated. Spectral Doppler was utilized to evaluate flow at rest and with distal augmentation maneuvers in the common femoral, femoral and popliteal veins. COMPARISON:  None. FINDINGS: RIGHT LOWER EXTREMITY Common Femoral Vein: No evidence of thrombus. Normal compressibility, respiratory phasicity and response to augmentation. Saphenofemoral Junction: No evidence of thrombus. Normal compressibility and flow on color Doppler imaging. Profunda Femoral Vein: No evidence of thrombus. Normal compressibility and flow on color Doppler imaging. Femoral Vein: No evidence of thrombus. Normal compressibility, respiratory phasicity and response to augmentation. Popliteal Vein: No evidence of thrombus. Normal compressibility, respiratory phasicity and response to augmentation. Calf Veins: No evidence of thrombus. Normal compressibility and flow on color Doppler imaging. Superficial Great Saphenous Vein: No evidence of thrombus. Normal compressibility and flow on color Doppler imaging. Venous Reflux:  None. Other Findings:  None. LEFT LOWER EXTREMITY Common Femoral Vein: No evidence of thrombus. Normal compressibility, respiratory phasicity and response to augmentation. Saphenofemoral Junction: No evidence of thrombus. Normal compressibility and flow on color Doppler imaging. Profunda Femoral Vein: No evidence of thrombus. Normal  compressibility and flow on color Doppler imaging. Femoral Vein: No evidence of thrombus. Normal compressibility, respiratory phasicity and response to augmentation. Popliteal Vein: No evidence of thrombus. Normal compressibility, respiratory phasicity and response to augmentation. Calf Veins: Nonocclusive thrombus noted within the left peroneal veins, similar to prior study. Superficial Great Saphenous Vein: No evidence of thrombus. Normal compressibility and flow on color Doppler imaging. Venous Reflux:  None. Other Findings:  None.  IMPRESSION: Continued nonocclusive thrombus within the left peroneal veins in the left calf. Otherwise no additional lower extremity deep venous thrombosis. Electronically Signed   By: Charlett Nose M.D.   On: 12/27/2016 11:05   Dg Foot 2 Views Right  Result Date: 12/27/2016 CLINICAL DATA:  Question gout EXAM: RIGHT FOOT - 2 VIEW COMPARISON:  None. FINDINGS: There is no evidence of fracture or dislocation. There is no evidence of arthropathy or other focal bone abnormality. Soft tissues are unremarkable. IMPRESSION: Negative. Electronically Signed   By: Charlett Nose M.D.   On: 12/27/2016 15:15   Dg Foot Complete Left  Result Date: 12/27/2016 CLINICAL DATA:  Bilateral foot pain and swelling.  No known injury. EXAM: LEFT FOOT - COMPLETE 3+ VIEW COMPARISON:  03/23/2014. FINDINGS: Moderate sized posterior calcaneal spur. Minimal dorsal talonavicular spur formation. No effusion. IMPRESSION: Stable degenerative spur formation.  No acute abnormality. Electronically Signed   By: Beckie Salts M.D.   On: 12/27/2016 15:18    EKG:   Orders placed or performed during the hospital encounter of 12/27/16  . EKG 12-Lead  . EKG 12-Lead      Management plans discussed with the patient, family and they are in agreement.  CODE STATUS:     Code Status Orders        Start     Ordered   12/27/16 1643  Full code  Continuous     12/27/16 1642    Code Status History    Date  Active Date Inactive Code Status Order ID Comments User Context   11/07/2016  1:28 AM 11/07/2016  2:48 PM Full Code [id number]  Oralia Manis, MD Inpatient      TOTAL TIME TAKING CARE OF THIS PATIENT: 35 minutes.    Altamese Dilling M.D on 12/28/2016 at 12:00 PM  Between 7am to 6pm - Pager - [phone]  After 6pm go to [email or web address] - password [email or web address]  [facility]  Office  [phone]  CC: Primary care physician; Pcp Not In System   Note: This dictation was prepared with Dragon dictation along with smaller phrase technology. Any transcriptional errors that result from this process are unintentional.

## 2016-12-28 NOTE — Progress Notes (Signed)
MD gave orders for discharge. Reviewed AVS - answered all questions and stated he had no further questions. Prescriptions given. IV removed. Patient stated that he told his ride not to be here until 5:00pm - patient stated that he would call and let them know he was ready now. Waiting for transportation to arrive.

## 2017-01-01 LAB — CULTURE, BLOOD (ROUTINE X 2)
CULTURE: NO GROWTH
CULTURE: NO GROWTH
SPECIAL REQUESTS: ADEQUATE

## 2017-02-28 ENCOUNTER — Emergency Department: Payer: Self-pay

## 2017-02-28 ENCOUNTER — Encounter: Payer: Self-pay | Admitting: Emergency Medicine

## 2017-02-28 ENCOUNTER — Emergency Department
Admission: EM | Admit: 2017-02-28 | Discharge: 2017-02-28 | Disposition: A | Discharge status: Home / Self Care | Payer: Self-pay | Attending: Emergency Medicine | Admitting: Emergency Medicine

## 2017-02-28 DIAGNOSIS — Z79899 Other long term (current) drug therapy: Secondary | ICD-10-CM | POA: Insufficient documentation

## 2017-02-28 DIAGNOSIS — R0789 Other chest pain: Secondary | ICD-10-CM

## 2017-02-28 DIAGNOSIS — R0602 Shortness of breath: Secondary | ICD-10-CM | POA: Insufficient documentation

## 2017-02-28 DIAGNOSIS — F17210 Nicotine dependence, cigarettes, uncomplicated: Secondary | ICD-10-CM | POA: Insufficient documentation

## 2017-02-28 DIAGNOSIS — F1410 Cocaine abuse, uncomplicated: Secondary | ICD-10-CM

## 2017-02-28 DIAGNOSIS — J45909 Unspecified asthma, uncomplicated: Secondary | ICD-10-CM | POA: Insufficient documentation

## 2017-02-28 DIAGNOSIS — R079 Chest pain, unspecified: Secondary | ICD-10-CM

## 2017-02-28 HISTORY — DX: Other pulmonary embolism without acute cor pulmonale: I26.99

## 2017-02-28 HISTORY — DX: Acute embolism and thrombosis of unspecified deep veins of unspecified lower extremity: I82.409

## 2017-02-28 LAB — URINALYSIS, COMPLETE (UACMP) WITH MICROSCOPIC
BACTERIA UA: NONE SEEN
BILIRUBIN URINE: NEGATIVE
GLUCOSE, UA: NEGATIVE mg/dL
HGB URINE DIPSTICK: NEGATIVE
Ketones, ur: NEGATIVE mg/dL
LEUKOCYTES UA: NEGATIVE
NITRITE: NEGATIVE
Protein, ur: NEGATIVE mg/dL
SPECIFIC GRAVITY, URINE: 1.017 (ref 1.005–1.030)
SQUAMOUS EPITHELIAL / LPF: NONE SEEN
pH: 6 (ref 5.0–8.0)

## 2017-02-28 LAB — URINE DRUG SCREEN, QUALITATIVE (ARMC ONLY)
Amphetamines, Ur Screen: NOT DETECTED
BARBITURATES, UR SCREEN: NOT DETECTED
Benzodiazepine, Ur Scrn: POSITIVE — AB
CANNABINOID 50 NG, UR ~~LOC~~: NOT DETECTED
COCAINE METABOLITE, UR ~~LOC~~: POSITIVE — AB
MDMA (ECSTASY) UR SCREEN: NOT DETECTED
Methadone Scn, Ur: NOT DETECTED
Opiate, Ur Screen: NOT DETECTED
PHENCYCLIDINE (PCP) UR S: NOT DETECTED
TRICYCLIC, UR SCREEN: NOT DETECTED

## 2017-02-28 LAB — TROPONIN I
Troponin I: <0.03 ng/mL (ref <0.03)
Troponin I: <0.03 ng/mL (ref <0.03)

## 2017-02-28 LAB — CBC
HEMATOCRIT: 45.6 % (ref 40.0–52.0)
HEMOGLOBIN: 14.9 g/dL (ref 13.0–18.0)
MCH: 29.2 pg (ref 26.0–34.0)
MCHC: 32.7 g/dL (ref 32.0–36.0)
MCV: 89.3 fL (ref 80.0–100.0)
Platelets: 266 10*3/uL (ref 150–440)
RBC: 5.11 MIL/uL (ref 4.40–5.90)
RDW: 16 % — ABNORMAL HIGH (ref 11.5–14.5)
WBC: 15.2 10*3/uL — ABNORMAL HIGH (ref 3.8–10.6)

## 2017-02-28 LAB — BRAIN NATRIURETIC PEPTIDE: B NATRIURETIC PEPTIDE 5: 14 pg/mL (ref 0.0–100.0)

## 2017-02-28 LAB — BASIC METABOLIC PANEL
ANION GAP: 10 (ref 5–15)
BUN: 21 mg/dL — ABNORMAL HIGH (ref 6–20)
CHLORIDE: 101 mmol/L (ref 101–111)
CO2: 25 mmol/L (ref 22–32)
CREATININE: 1.49 mg/dL — AB (ref 0.61–1.24)
Calcium: 8.8 mg/dL — ABNORMAL LOW (ref 8.9–10.3)
GFR calc non Af Amer: 58 mL/min — ABNORMAL LOW (ref >60)
Glucose, Bld: 100 mg/dL — ABNORMAL HIGH (ref 65–99)
POTASSIUM: 4 mmol/L (ref 3.5–5.1)
SODIUM: 136 mmol/L (ref 135–145)

## 2017-02-28 LAB — CK: CK TOTAL: 113 U/L (ref 49–397)

## 2017-02-28 LAB — APTT: APTT: 26 s (ref 24–36)

## 2017-02-28 LAB — PROTIME-INR
INR: 1.02
Prothrombin Time: 13.4 seconds (ref 11.4–15.2)

## 2017-02-28 MED ORDER — RIVAROXABAN 20 MG PO TABS: 20.0000 mg | ORAL_TABLET | Freq: Every evening | ORAL | 0 refills | Status: DC

## 2017-02-28 MED ORDER — HEPARIN (PORCINE) IN NACL 100-0.45 UNIT/ML-% IJ SOLN
1350.0000 [IU]/h | INTRAMUSCULAR | Status: DC
  Filled 2017-02-28 (×2): qty 250

## 2017-02-28 MED ORDER — SODIUM CHLORIDE 0.9 % IV BOLUS (SEPSIS)
1000.0000 mL | Freq: Once | INTRAVENOUS | Status: AC
  Administered 2017-02-28: 1000 mL via INTRAVENOUS

## 2017-02-28 MED ORDER — HEPARIN BOLUS VIA INFUSION
4700.0000 [IU] | Freq: Once | INTRAVENOUS | Status: DC
  Filled 2017-02-28: qty 4700

## 2017-02-28 MED ORDER — IOPAMIDOL (ISOVUE-370) INJECTION 76%
75.0000 mL | Freq: Once | INTRAVENOUS | Status: AC | PRN
  Administered 2017-02-28: 75 mL via INTRAVENOUS

## 2017-02-28 MED ORDER — FENTANYL CITRATE (PF) 100 MCG/2ML IJ SOLN
50.0000 ug | Freq: Once | INTRAMUSCULAR | Status: AC
  Administered 2017-02-28: 50 ug via INTRAVENOUS
  Filled 2017-02-28: qty 2

## 2017-02-28 MED ORDER — ASPIRIN 81 MG PO CHEW
324.0000 mg | CHEWABLE_TABLET | Freq: Once | ORAL | Status: AC
  Administered 2017-02-28: 324 mg via ORAL
  Filled 2017-02-28: qty 4

## 2017-02-28 NOTE — ED Notes (Signed)
Pt. Verbalizes understanding of d/c instructions, prescriptions, and follow-up. VS stable and pt. in NAD at time of d/c. Pt. Stable at the time of departure from the unit, departing unit by the safest and most appropriate manner per that pt condition and limitations. Pt advised to return to the ED at any time for emergent concerns, or for new/worsening symptoms.

## 2017-02-28 NOTE — Progress Notes (Signed)
ANTICOAGULATION CONSULT NOTE - Initial Consult  Pharmacy Consult for Heparin Infusion Indication: pulmonary embolus  Allergies  Allergen Reactions  . Hydrocodone Rash    Patient Measurements: Height: 5\' 11"  (180.3 cm) Weight: 174 lb (78.9 kg) IBW/kg (Calculated) : 75.3 Heparin Dosing Weight: 78.9 kg  Vital Signs: Temp: 99.5 F (37.5 C) (07/02 1646) Temp Source: Oral (07/02 1646) BP: 91/74 (07/02 1646) Pulse Rate: 102 (07/02 1646)  Labs:  Recent Labs  02/28/17 1705  HGB 14.9  HCT 45.6  PLT 266  APTT 26  LABPROT 13.4  INR 1.02  CREATININE 1.49*  TROPONINI <0.03    Estimated Creatinine Clearance: 70.9 mL/min (A) (by C-G formula based on SCr of 1.49 mg/dL (H)).   Medical History: Past Medical History:  Diagnosis Date  . Asthma   . Chronic dental pain   . Chronic shoulder pain   . DVT (deep venous thrombosis) (HCC)   . Headache   . Pulmonary embolism (HCC)   . Right-sided back pain     Assessment: Patient is a 41 yo male admitted for PE. Patient a history of DVT/PE and was taking Xarelto but stopped medication 2 weeks ago. Pharmacy consulted for Heparin dosing.  Goal of Therapy:  Heparin level 0.3-0.7 units/ml Monitor platelets by anticoagulation protocol: Yes   Plan:  Give 4700 units bolus x 1 Start heparin infusion at 1350 units/hr Check anti-Xa level in 6 hours and daily while on heparin Continue to monitor H&H and platelets  Clovia CuffLisa Davianna Deutschman, PharmD, BCPS 02/28/2017 6:16 PM

## 2017-02-28 NOTE — ED Notes (Signed)
Pt resting in bed, c/o swelling to R foot, states he is now unable to bend his toes, continued complaints of chest pain at this time. Will continue to monitor for further patient needs. VSS at this time.

## 2017-02-28 NOTE — ED Provider Notes (Addendum)
[facility] Emergency Department Provider Note  ____________________________________________   I have reviewed the triage vital signs and the nursing notes.   HISTORY  Chief Complaint Chest Pain    HPI David Leach is a 41 y.o. 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 Presents today complaining of chest pain and feeling dehydrated. This began about an hour ago. Denies any fever or chills. Denies any shortness of breath. States his whole body hurts but his chest hurts the most. Was working in the heat. Has diffuse cramping. States that he sometimes feels short of breath but not too bad. Does not feel like his PE. Chest pain as a substernal sharp discomfort worse when he changes position or moves. Does not recollect any trauma.Feels that he might be dehydrated.     Past Medical History:  Diagnosis Date  . Asthma   . Chronic dental pain   . Chronic shoulder pain   . DVT (deep venous thrombosis) (HCC)   . Headache   . Pulmonary embolism (HCC)   . Right-sided back pain     Patient Active Problem List   Diagnosis Date Noted  . 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 Foot pain, bilateral 12/27/2016  . Pleuritic chest pain 12/27/2016  . Acute renal insufficiency 12/27/2016  . Pain of foot 12/27/2016  . Pulmonary embolism (HCC) 11/06/2016  . DVT (deep venous thrombosis) (HCC) 11/06/2016  . Asthma 11/06/2016    Past Surgical History:  Procedure Laterality Date  . TONSILLECTOMY      Prior to Admission medications   Medication Sig Start Date End Date Taking? Authorizing Provider  albuterol (PROVENTIL HFA;VENTOLIN HFA) 108 (90 Base) MCG/ACT inhaler Inhale 2 puffs into the lungs every 6 (six) hours as needed for wheezing or  shortness of breath. 12/16/15   Beers, Charmayne Sheer, PA-C  allopurinol (ZYLOPRIM) 100 MG tablet Take 1 tablet (100 mg total) by mouth daily. 12/28/16 01/17/17  Altamese Dilling, MD  oxyCODONE (OXY IR/ROXICODONE) 5 MG immediate release tablet Take 1 tablet (5 mg total) by mouth every 6 (six) hours as needed for moderate pain or severe pain. 12/28/16   Altamese Dilling, MD  rivaroxaban (XARELTO) 20 MG TABS tablet Take 20 mg by mouth every evening. 11/29/16 11/29/17  [provider]    Allergies Hydrocodone  Family History  Problem Relation Age of Onset  . Cancer Mother   . Diabetes Father   . Cancer Other   . Stroke Other   . Diabetes Other     Social History Social History  Substance Use Topics  . Smoking status: Current Every Day Smoker    Packs/day: 1.00    Years: 20.00    Types: Cigarettes  . Smokeless tobacco: Former Neurosurgeon    Types: Chew  . Alcohol use Yes     Comment: occ    Review of Systems Constitutional: No fever/chills Eyes: No visual changes. ENT: No sore throat. No stiff neck no neck pain Cardiovascular: Positive chest pain. Respiratory: Patient initially denies shortness of breath but when specific mass that he might have had some. Gastrointestinal:   no vomiting.  No diarrhea.  No constipation. Genitourinary: Negative for dysuria. Musculoskeletal: Negative lower extremity swelling Skin: Negative for rash. Neurological: Negative for severe headaches, focal  weakness or numbness.   ____________________________________________   PHYSICAL EXAM:  VITAL SIGNS: ED Triage Vitals  Enc Vitals Group     BP 02/28/17 1646 91/74     Pulse Rate 02/28/17 1646 (!) 102     Resp 02/28/17 1646 18     Temp 02/28/17 1646 99.5 F (37.5 C)     Temp Source 02/28/17 1646 Oral     SpO2 02/28/17 1646 97 %     Weight 02/28/17 1644 174 lb (78.9 kg)     Height 02/28/17 1644 5\' 11"  (1.803 m)     Head Circumference --      Peak Flow --      Pain Score 02/28/17 1643 10      Pain Loc --      Pain Edu? --      Excl. in GC? --     Constitutional: Alert and oriented. Well appearing and in no acute distress.Repeat blood pressure 117 over palp he is somewhat anxious. Eyes: Conjunctivae are normal Head: Atraumatic HEENT: No congestion/rhinnorhea. Mucous membranes are dry.  Oropharynx non-erythematous Neck:   Nontender with no meningismus, no masses, no stridor Chest: Tender to chest wall palpation to the anterior chest which is a patient states "ouch that is the pain right there" and pulls back. No lesions crepitus or flail chest noted Cardiovascular: Normal rate, regular rhythm. Grossly normal heart sounds.  Good peripheral circulation. Respiratory: Normal respiratory effort.  No retractions. Lungs CTAB. Abdominal: Soft and nontender. No distention. No guarding no rebound Back:  There is no focal tenderness or step off.  there is no midline tenderness there are no lesions noted. there is no CVA tenderness Musculoskeletal: No lower extremity tenderness, no upper extremity tenderness. No joint effusions, no DVT signs strong distal pulses no edema Neurologic:  Normal speech and language. No gross focal neurologic deficits are appreciated.  Skin:  Skin is warm, dry and intact. No rash noted. Psychiatric: Mood and affect are somewhat anxious. Speech and behavior are normal.  ____________________________________________   LABS (all labs ordered are listed, but only abnormal results are displayed)  Labs Reviewed  BASIC METABOLIC PANEL  CBC  TROPONIN I  PROTIME-INR  APTT  BRAIN NATRIURETIC PEPTIDE  I-STAT CREATININE ([facility] MANUAL ENTRY)   ____________________________________________  EKG  I personally interpreted any EKGs ordered by me or triage Sinus tach rate 102, normal axis, right bundle-branch block, partial, no acute ischemic changes ____________________________________________  RADIOLOGY  I reviewed any imaging ordered by me or triage that were  performed during my shift and, if possible, patient and/or family made aware of any abnormal findings. ____________________________________________   PROCEDURES  Procedure(s) performed: None  Procedures  Critical Care performed: None  ____________________________________________   INITIAL IMPRESSION / ASSESSMENT AND PLAN / ED COURSE  Pertinent labs & imaging results that were available during my care of the patient were reviewed by me and considered in my medical decision making (see chart for details).  Patient with a history of narcotic abuse and PEs, noncompliance presents here with chest pain patient has some degree of reproducibility to this but given his history I did order heparin bolus initially and CTA. CTA however shows no evidence of PE, therefore I discontinued the heparin. He did not actually get up and the pharmacy prior to CT. Patient vital signs are reassuring, very low suspicion for ACS but we'll send a second troponin. We will continue Gemzar the patient closely in the emergency room. He does appear to be dehydrated  and having diffuse myalgias probably secondary to some degree of heat exhaustion. Given a allergy to hydrocodone will avoid narcotics however  I am giving him aspirin as a precaution. Patient feeling better after IV fluid.   ----------------------------------------- 6:42 PM on 02/28/2017 -----------------------------------------  Administration demanding narcotic pain medication for his chest pain. Very reassuring exam. he does have some evidence of intravascular depletion with an elevated creatinine. This was negative we are giving him IV fluid. Remains with reversal chest wall pain. CT scan shows absolute no evidence of blood clot. She does demanding narcotics. I'll give him 50 of fentanyl pending his second troponin but very reassured by workup thus far.   ----------------------------------------- 7:59 PM on  02/28/2017 -----------------------------------------  Patient positive for cocaine and benzos. Perhaps this is contributing to his discomfort and anxiety.  ----------------------------------------- 8:59 PM on 02/28/2017 -----------------------------------------  Multiple phone call from family members taken by the nurse, family members concerned the patient is a drug user of a recommended we not give him narcotics. After fentanyl, patient's chest pain seems to be gone he is not complaining of toe pain on the right. No evidence of infection or infarction or trauma. He continues to request narcotics for various complaints however he is a very assuring workup with negative troponins 2. No evidence of rhabdomyolysis no evidence of CHF, no evidence of PE, no evidence of pneumonia and no evidence of ACS no evidence of DVT on exam. Administration demonstrating a preoccupation with IV narcotics. We will monitor give him given the negative results. We will discharge him at this time.At this time, there does not appear to be clinical evidence to support the diagnosis of pulmonary embolus, dissection, myocarditis, endocarditis, pericarditis, pericardial tamponade, acute coronary syndrome, pneumothorax, pneumonia, or any other acute intrathoracic pathology that will require admission or acute intervention. Nor is there evidence of any significant intra-abdominal pathology causing this discomfort. Return precautions and follow-up given and understood  ----------------------------------------- 9:31 PM on 02/28/2017 -----------------------------------------  Upon discharge patient is complaining of abdominal discomfort in his wrist which is new that he did not mention initially, also he has found some poison oak on himself and he wants me to treat and he has some other complaints that do not appear to be acute. His exam is unchanged however, he continues to to request narcotic pain medication, patient clearly has a  history of substance abuse. Unfortunately this history of substance abuse makes it very difficult to further evaluate him for all of his other chronic complaints which are now surfacing. I see no evidence of acute pathology requiring admission to the hospital and patient refuses admission anyway. I am very reassured by his findings of CT and blood work and I am rewriting him for his anticoagulation medication I will advise close outpatient follow-up for his other issues.    ____________________________________________   FINAL CLINICAL IMPRESSION(S) / ED DIAGNOSES  Final diagnoses:  None      This chart was dictated using voice recognition software.  Despite best efforts to proofread,  errors can occur which can change meaning.      Jeanmarie PlantMcShane, Ikeya Brockel A, MD 02/28/17 1815    Jeanmarie PlantMcShane, Traveon Louro A, MD 02/28/17 1843    Jeanmarie PlantMcShane, Earlena Werst A, MD 02/28/17 Trilby Drummer2000    Lucienne Sawyers A, MD 02/28/17 2100    Jeanmarie PlantMcShane, Doryce Mcgregory A, MD 02/28/17 2133

## 2017-02-28 NOTE — ED Notes (Addendum)
This RN to bedside to instruct patient to call his ride. Pt states understanding. C/o swelling to L wrist and to R foot and R hand. Pt also c/o rash to his L arm. MD made aware at this time.

## 2017-02-28 NOTE — ED Notes (Signed)
No change in patient condition. Pt resting in bed at this time. Will continue to monitor for further patient needs at this time.

## 2017-02-28 NOTE — ED Triage Notes (Signed)
C/O chest pain and SOB today. States he has been working outside all day, but pain has persisted.  Has history of DVT and PE, stopped taking Xeralto 2 weeks ago.

## 2017-02-28 NOTE — ED Notes (Signed)
Medication administered per MD order, UA and UDS collected at this time. No change in patient condition. Will continue to monitor for further patient needs.

## 2017-03-01 LAB — POCT I-STAT CREATININE: Creatinine, Ser: 1.4 mg/dL — ABNORMAL HIGH (ref 0.61–1.24)

## 2017-04-15 ENCOUNTER — Emergency Department: Payer: Self-pay

## 2017-04-15 ENCOUNTER — Encounter: Payer: Self-pay | Admitting: Emergency Medicine

## 2017-04-15 ENCOUNTER — Emergency Department
Admission: EM | Admit: 2017-04-15 | Discharge: 2017-04-15 | Disposition: A | Discharge status: Home / Self Care | Payer: Self-pay | Attending: Emergency Medicine | Admitting: Emergency Medicine

## 2017-04-15 DIAGNOSIS — T675XXA Heat exhaustion, unspecified, initial encounter: Secondary | ICD-10-CM | POA: Insufficient documentation

## 2017-04-15 DIAGNOSIS — Z79899 Other long term (current) drug therapy: Secondary | ICD-10-CM | POA: Insufficient documentation

## 2017-04-15 DIAGNOSIS — J45909 Unspecified asthma, uncomplicated: Secondary | ICD-10-CM | POA: Insufficient documentation

## 2017-04-15 DIAGNOSIS — F17210 Nicotine dependence, cigarettes, uncomplicated: Secondary | ICD-10-CM | POA: Insufficient documentation

## 2017-04-15 DIAGNOSIS — M542 Cervicalgia: Secondary | ICD-10-CM | POA: Insufficient documentation

## 2017-04-15 LAB — GLUCOSE, CAPILLARY: GLUCOSE-CAPILLARY: 98 mg/dL (ref 65–99)

## 2017-04-15 LAB — CBC
HCT: 36.9 % — ABNORMAL LOW (ref 40.0–52.0)
HEMOGLOBIN: 12.4 g/dL — AB (ref 13.0–18.0)
MCH: 30.3 pg (ref 26.0–34.0)
MCHC: 33.6 g/dL (ref 32.0–36.0)
MCV: 89.9 fL (ref 80.0–100.0)
PLATELETS: 219 10*3/uL (ref 150–440)
RBC: 4.1 MIL/uL — AB (ref 4.40–5.90)
RDW: 15.2 % — AB (ref 11.5–14.5)
WBC: 5.9 10*3/uL (ref 3.8–10.6)

## 2017-04-15 LAB — BASIC METABOLIC PANEL
ANION GAP: 7 (ref 5–15)
BUN: 19 mg/dL (ref 6–20)
CHLORIDE: 107 mmol/L (ref 101–111)
CO2: 25 mmol/L (ref 22–32)
Calcium: 8.5 mg/dL — ABNORMAL LOW (ref 8.9–10.3)
Creatinine, Ser: 1.09 mg/dL (ref 0.61–1.24)
GFR calc non Af Amer: >60 mL/min (ref >60)
GLUCOSE: 101 mg/dL — AB (ref 65–99)
Potassium: 4.3 mmol/L (ref 3.5–5.1)
Sodium: 139 mmol/L (ref 135–145)

## 2017-04-15 LAB — TROPONIN I

## 2017-04-15 MED ORDER — SODIUM CHLORIDE 0.9 % IV SOLN
1000.0000 mL | Freq: Once | INTRAVENOUS | Status: AC
  Administered 2017-04-15: 1000 mL via INTRAVENOUS

## 2017-04-15 MED ORDER — NAPROXEN 500 MG PO TABS: 500.0000 mg | ORAL_TABLET | Freq: Two times a day (BID) | ORAL | 2 refills | Status: DC

## 2017-04-15 MED ORDER — KETOROLAC TROMETHAMINE 30 MG/ML IJ SOLN
30.0000 mg | Freq: Once | INTRAMUSCULAR | Status: AC
  Administered 2017-04-15: 30 mg via INTRAVENOUS
  Filled 2017-04-15: qty 1

## 2017-04-15 NOTE — ED Provider Notes (Signed)
[facility] Emergency Department Provider Note   ____________________________________________    I have reviewed the triage vital signs and the nursing notes.   HISTORY  Chief Complaint Syncope    HPI David Leach is a 41 y.o. male who presents after a likely syncopal episode. 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 He states he staggered and then fell and thinks he may have hit the back of his head. He is on blood thinners because of a history of PE. No chest pain or palpitations. No nausea or vomiting. No history of seizures. Currently feels well but does complain of mild right-sided neck pain. No history of syncopal episodes in the past   Past Medical History:  Diagnosis Date  . Asthma   . Chronic dental pain   . Chronic shoulder pain   . DVT (deep venous thrombosis) (HCC)   . Headache   . Pulmonary embolism (HCC)   . Right-sided back pain     Patient Active Problem List   Diagnosis Date Noted  . 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 Foot pain, bilateral 12/27/2016  . Pleuritic chest pain 12/27/2016  . Acute renal insufficiency 12/27/2016  . Pain of foot 12/27/2016  . Pulmonary embolism (HCC) 11/06/2016  . DVT (deep venous thrombosis) (HCC) 11/06/2016  . Asthma 11/06/2016    Past Surgical History:  Procedure Laterality Date  . TONSILLECTOMY      Prior to Admission medications   Medication Sig Start Date End Date Taking? Authorizing Provider  albuterol (PROVENTIL HFA;VENTOLIN HFA) 108 (90 Base) MCG/ACT inhaler Inhale 2 puffs into the lungs every 6 (six) hours as needed for wheezing or shortness of breath. 12/16/15   Beers, Charmayne Sheer, PA-C  allopurinol (ZYLOPRIM) 100 MG tablet Take 1 tablet (100 mg total) by mouth daily. 12/28/16 01/17/17  Altamese Dilling, MD  oxyCODONE (OXY IR/ROXICODONE) 5 MG immediate release tablet Take 1 tablet (5  mg total) by mouth every 6 (six) hours as needed for moderate pain or severe pain. 12/28/16   Altamese Dilling, MD  rivaroxaban (XARELTO) 20 MG TABS tablet Take 1 tablet (20 mg total) by mouth every evening. 02/28/17 02/28/18  Jeanmarie Plant, MD     Allergies Hydrocodone  Family History  Problem Relation Age of Onset  . Cancer Mother   . Diabetes Father   . Cancer Other   . Stroke Other   . Diabetes Other     Social History Social History  Substance Use Topics  . Smoking status: Current Every Day Smoker    Packs/day: 1.00    Years: 20.00    Types: Cigarettes  . Smokeless tobacco: Former Neurosurgeon    Types: Chew  . Alcohol use Yes     Comment: occ    Review of Systems  Constitutional: No fever/chills Eyes: No visual changes.  ENT: Neck pain as above Cardiovascular: Denies chest pain. Respiratory: Denies shortness of breath. Gastrointestinal: No abdominal pain.  No nausea, no vomiting.   Genitourinary: Negative for dysuria. Musculoskeletal: Negative for back pain. Skin: Negative for rash. Neurological: Negative for headaches    ____________________________________________   PHYSICAL EXAM:  VITAL SIGNS: ED Triage Vitals  Enc Vitals Group     BP 04/15/17 1133 (!) 124/92     Pulse Rate 04/15/17 1133 91     Resp 04/15/17 1133 16     Temp 04/15/17 1133 98.2  F (36.8 C)     Temp Source 04/15/17 1133 Oral     SpO2 04/15/17 1133 94 %     Weight 04/15/17 1137 81.6 kg (180 lb)     Height 04/15/17 1137 1.803 m (5\' 11" )     Head Circumference --      Peak Flow --      Pain Score 04/15/17 1133 10     Pain Loc --      Pain Edu? --      Excl. in GC? --     Constitutional: Alert and oriented. No acute distress.  Eyes: Conjunctivae are normal.  Head: Atraumatic. Nose: No congestion/rhinnorhea. Mouth/Throat: Mucous membranes are moist.   Neck:  No vertebral tenderness to palpation, mild right-sided neck discomfort Cardiovascular: Normal rate, regular rhythm. Grossly  normal heart sounds.  Good peripheral circulation. Respiratory: Normal respiratory effort.  No retractions. Lungs CTAB. Gastrointestinal: Soft and nontender. No distention.   Genitourinary: deferred Musculoskeletal: No lower extremity tenderness nor edema.  Warm and well perfused Neurologic:  Normal speech and language. No gross focal neurologic deficits are appreciated.  Skin:  Skin is warm, dry and intact. No rash noted. Psychiatric: Mood and affect are normal. Speech and behavior are normal.  ____________________________________________   LABS (all labs ordered are listed, but only abnormal results are displayed)  Labs Reviewed  BASIC METABOLIC PANEL - Abnormal; Notable for the following:       Result Value   Glucose, Bld 101 (*)    Calcium 8.5 (*)    All other components within normal limits  CBC - Abnormal; Notable for the following:    RBC 4.10 (*)    Hemoglobin 12.4 (*)    HCT 36.9 (*)    RDW 15.2 (*)    All other components within normal limits  GLUCOSE, CAPILLARY  TROPONIN I  CBG MONITORING, ED   ____________________________________________  EKG  ED ECG REPORT I, Jene Every, the attending physician, personally viewed and interpreted this ECG.  Date: 04/15/2017   Rhythm: normal sinus rhythm QRS Axis: normal Intervals: normal ST/T Wave abnormalities: normal Narrative Interpretation: unremarkable  ____________________________________________  RADIOLOGY  CT head and cervical spine pending ____________________________________________   PROCEDURES  Procedure(s) performed: No    Critical Care performed: No ____________________________________________   INITIAL IMPRESSION / ASSESSMENT AND PLAN / ED COURSE  Pertinent labs & imaging results that were available during my care of the patient were reviewed by me and considered in my medical decision making (see chart for details).  Patient presents after likely syncopal episode, I suspect this was  related to heat exhaustion given the intensely high heat and humidity today. Given prodrome of lightheadedness and dizziness is not consistent with seizure. No chest pain or palpitations either. We will give IV fluids obtain imaging of the head and neck check labs and reevaluate.   Patient's CTs are unremarkable. He is feeling better after Toradol and IV fluids. He has been monitored in the emergency department for several hours now. He feels well and is becoming impatient. I feel he is appropriate for outpatient follow-up as needed. Return precautions as discussed.   ____________________________________________   FINAL CLINICAL IMPRESSION(S) / ED DIAGNOSES  Final diagnoses:  Heat exhaustion, initial encounter      NEW MEDICATIONS STARTED DURING THIS VISIT:  New Prescriptions   No medications on file     Note:  This document was prepared using Dragon voice recognition software and may include unintentional dictation errors.  Jene Every, MD 04/15/17 [phone]

## 2017-04-15 NOTE — ED Notes (Signed)
NAD. No signs of seizure activity. Pt is alert and oriented.  Remains on cardiac monitor.

## 2017-04-15 NOTE — ED Notes (Signed)
philly collar placed on pt 

## 2017-04-15 NOTE — ED Notes (Signed)
Returned from CT.

## 2017-04-15 NOTE — ED Triage Notes (Signed)
Pt felt like going to pass out. Per EMS pt was witnessed by coworkers having seizure like activity.  Pt has "blacked" out before.  Is on blood thinners.  C/o severe neck pain and inability to move neck to right.

## 2017-07-26 ENCOUNTER — Emergency Department
Admission: EM | Admit: 2017-07-26 | Discharge: 2017-07-26 | Disposition: A | Discharge status: Home / Self Care | Payer: Self-pay | Attending: Emergency Medicine | Admitting: Emergency Medicine

## 2017-07-26 ENCOUNTER — Other Ambulatory Visit: Payer: Self-pay

## 2017-07-26 ENCOUNTER — Emergency Department: Payer: Self-pay

## 2017-07-26 ENCOUNTER — Encounter: Payer: Self-pay | Admitting: Emergency Medicine

## 2017-07-26 DIAGNOSIS — R220 Localized swelling, mass and lump, head: Secondary | ICD-10-CM

## 2017-07-26 DIAGNOSIS — Z7901 Long term (current) use of anticoagulants: Secondary | ICD-10-CM | POA: Insufficient documentation

## 2017-07-26 DIAGNOSIS — F17210 Nicotine dependence, cigarettes, uncomplicated: Secondary | ICD-10-CM | POA: Insufficient documentation

## 2017-07-26 DIAGNOSIS — R748 Abnormal levels of other serum enzymes: Secondary | ICD-10-CM | POA: Insufficient documentation

## 2017-07-26 DIAGNOSIS — R600 Localized edema: Secondary | ICD-10-CM | POA: Insufficient documentation

## 2017-07-26 DIAGNOSIS — J45909 Unspecified asthma, uncomplicated: Secondary | ICD-10-CM | POA: Insufficient documentation

## 2017-07-26 DIAGNOSIS — G8929 Other chronic pain: Secondary | ICD-10-CM | POA: Insufficient documentation

## 2017-07-26 LAB — CBC WITH DIFFERENTIAL/PLATELET
Basophils Absolute: 0.1 10*3/uL (ref 0–0.1)
Basophils Relative: 1 %
Eosinophils Absolute: 0.2 10*3/uL (ref 0–0.7)
Eosinophils Relative: 3 %
HEMATOCRIT: 43.2 % (ref 40.0–52.0)
HEMOGLOBIN: 14.3 g/dL (ref 13.0–18.0)
LYMPHS ABS: 2.4 10*3/uL (ref 1.0–3.6)
LYMPHS PCT: 35 %
MCH: 30.5 pg (ref 26.0–34.0)
MCHC: 33.1 g/dL (ref 32.0–36.0)
MCV: 92.2 fL (ref 80.0–100.0)
MONOS PCT: 20 %
Monocytes Absolute: 1.4 10*3/uL — ABNORMAL HIGH (ref 0.2–1.0)
NEUTROS ABS: 2.8 10*3/uL (ref 1.4–6.5)
NEUTROS PCT: 41 %
Platelets: 252 10*3/uL (ref 150–440)
RBC: 4.68 MIL/uL (ref 4.40–5.90)
RDW: 15.5 % — ABNORMAL HIGH (ref 11.5–14.5)
WBC: 6.8 10*3/uL (ref 3.8–10.6)

## 2017-07-26 LAB — COMPREHENSIVE METABOLIC PANEL
ALT: 738 U/L — AB (ref 17–63)
ANION GAP: 12 (ref 5–15)
AST: 473 U/L — ABNORMAL HIGH (ref 15–41)
Albumin: 3.8 g/dL (ref 3.5–5.0)
Alkaline Phosphatase: 195 U/L — ABNORMAL HIGH (ref 38–126)
BUN: 18 mg/dL (ref 6–20)
CHLORIDE: 98 mmol/L — AB (ref 101–111)
CO2: 24 mmol/L (ref 22–32)
Calcium: 9.1 mg/dL (ref 8.9–10.3)
Creatinine, Ser: 1.09 mg/dL (ref 0.61–1.24)
GFR calc non Af Amer: >60 mL/min (ref >60)
Glucose, Bld: 99 mg/dL (ref 65–99)
POTASSIUM: 4.8 mmol/L (ref 3.5–5.1)
SODIUM: 134 mmol/L — AB (ref 135–145)
Total Bilirubin: 1.2 mg/dL (ref 0.3–1.2)
Total Protein: 7.5 g/dL (ref 6.5–8.1)

## 2017-07-26 LAB — PROTIME-INR
INR: 1.02
PROTHROMBIN TIME: 13.3 s (ref 11.4–15.2)

## 2017-07-26 LAB — APTT: aPTT: 29 seconds (ref 24–36)

## 2017-07-26 MED ORDER — METHYLPREDNISOLONE SODIUM SUCC 125 MG IJ SOLR
125.0000 mg | Freq: Once | INTRAMUSCULAR | Status: AC
  Administered 2017-07-26: 125 mg via INTRAVENOUS
  Filled 2017-07-26: qty 2

## 2017-07-26 MED ORDER — DIPHENHYDRAMINE HCL 25 MG PO CAPS
50.0000 mg | ORAL_CAPSULE | Freq: Once | ORAL | Status: AC
  Administered 2017-07-26: 50 mg via ORAL
  Filled 2017-07-26: qty 2

## 2017-07-26 MED ORDER — FENTANYL CITRATE (PF) 100 MCG/2ML IJ SOLN
50.0000 ug | Freq: Once | INTRAMUSCULAR | Status: AC
  Administered 2017-07-26: 50 ug via INTRAVENOUS
  Filled 2017-07-26: qty 2

## 2017-07-26 MED ORDER — IOPAMIDOL (ISOVUE-300) INJECTION 61%
75.0000 mL | Freq: Once | INTRAVENOUS | Status: AC | PRN
  Administered 2017-07-26: 75 mL via INTRAVENOUS

## 2017-07-26 MED ORDER — FENTANYL CITRATE (PF) 100 MCG/2ML IJ SOLN
INTRAMUSCULAR | Status: AC
  Administered 2017-07-26: 50 ug via INTRAVENOUS
  Filled 2017-07-26: qty 2

## 2017-07-26 MED ORDER — PREDNISONE 20 MG PO TABS: 40.0000 mg | ORAL_TABLET | Freq: Every day | ORAL | 0 refills | Status: DC

## 2017-07-26 MED ORDER — KETOROLAC TROMETHAMINE 30 MG/ML IJ SOLN: 30.0000 mg | Freq: Once | INTRAMUSCULAR | Status: DC

## 2017-07-26 MED ORDER — FENTANYL CITRATE (PF) 100 MCG/2ML IJ SOLN
50.0000 ug | Freq: Once | INTRAMUSCULAR | Status: AC
  Administered 2017-07-26: 50 ug via INTRAVENOUS

## 2017-07-26 MED ORDER — OXYCODONE-ACETAMINOPHEN 5-325 MG PO TABS
ORAL_TABLET | ORAL | Status: AC
  Administered 2017-07-26: 1 via ORAL
  Filled 2017-07-26: qty 1

## 2017-07-26 MED ORDER — OXYCODONE-ACETAMINOPHEN 5-325 MG PO TABS
1.0000 | ORAL_TABLET | Freq: Once | ORAL | Status: AC
  Administered 2017-07-26: 1 via ORAL

## 2017-07-26 MED ORDER — EPINEPHRINE 0.3 MG/0.3ML IJ SOAJ: 0.3000 mg | Freq: Once | INTRAMUSCULAR | 1 refills | Status: AC

## 2017-07-26 NOTE — ED Triage Notes (Addendum)
Pt c/o right facial swelling that started 1 hr ago.  Feels like hard to breath at times. No dyspnea noted. Handling secretions. Has no teeth.  Feels like sees spots in both eyes.  No stridor on auscultation.  Is on xarelto. No injury per pt.  Swelling to right jaw area.

## 2017-07-26 NOTE — Discharge Instructions (Signed)
As we discussed it is important that you follow up with the GI doctor to further evaluate the elevated liver function tests noted on blood work today. Additionally please take benadryl for the next couple of days to help keep the swelling down. Please seek medical attention for any high fevers, chest pain, shortness of breath, change in behavior, persistent vomiting, bloody stool or any other new or concerning symptoms.

## 2017-07-26 NOTE — ED Notes (Signed)
Pt states R sided facial swelling x 2 hours. States he was scraping wall paper when swelling began. Pt denies eating anything new or new medications. Pt states R jaw pain. Pt denies dental pain d/t not having teeth. Pt is talking in complete sentences, no respiratory distress noted. Pt states middle of his back is swelling up too. Pt is alert and oriented x 4.   Pt states hx blood clots, takes xarelto x 6 months.

## 2017-07-26 NOTE — ED Notes (Signed)
Discussed pt with dr Alphonzo Lemmingsmcshane. No further testing at this time.

## 2017-07-26 NOTE — ED Notes (Signed)
Pt given sandwich tray 

## 2017-07-26 NOTE — ED Notes (Signed)
Informed pt if feels like swelling worse let nurse out front know. He states "it has gotten worse since we have been talking".  No visual increase in swelling noted to RN at this time. Remains to have no swelling palpated in neck.  Remains in jaw/cheek.

## 2017-07-26 NOTE — ED Notes (Signed)
First Nurse: pt returns to desk with c/o swelling to right side jaw seems to be getting worse. Pt with NAD, ambulating without difficulty and handling secretions normally.

## 2017-07-26 NOTE — ED Notes (Signed)
Pt given graham crackers and peanut butter. No distress noted. Remains talking in complete sentences.

## 2017-07-26 NOTE — ED Notes (Signed)
Pt given peanut butter and crackers. Was able to get down applesauce and was still hungry.

## 2017-07-26 NOTE — ED Notes (Signed)
Pt taken to CT via stretcher.

## 2017-07-26 NOTE — ED Notes (Signed)
Pt called out stating that he is hurting. Pt has pulled off BP cuff and pulse ox. Pt states "this is crazy." pt informed that Dr. Derrill KayGoodman has signed up for the pt and will be in soon.

## 2017-07-26 NOTE — ED Notes (Signed)
Dr. Goodman at bedside.  

## 2017-07-26 NOTE — ED Provider Notes (Signed)
[facility] Emergency Department Provider Note   ____________________________________________   I have reviewed the triage vital signs and the nursing notes.   HISTORY  Chief Complaint Facial Swelling   History limited by: Not Limited   HPI David Leach is a 41 y.o. male who presents to the emergency department today because of concern for facial swelling.   LOCATION:right cheek and lips DURATION:roughly 4 hours TIMING: started suddenly SEVERITY: severe QUALITY: painful CONTEXT: patient states he was at work when he started noticing the swelling. Co-workers also could notice the swelling. In addition to the swelling of his cheek and lips he also has some swelling of his back.  MODIFYING FACTORS: none ASSOCIATED SYMPTOMS: patient states he feels like he is having some difficulty with swallowing. No new medications.   Per medical record review patient has a history of blood clots, is on xarelto.   Past Medical History:  Diagnosis Date  . Asthma   . Chronic dental pain   . Chronic shoulder pain   . DVT (deep venous thrombosis) ([place])   . Headache   . Pulmonary embolism ([place])   . Right-sided back pain     Patient Active Problem List   Diagnosis Date Noted  . 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 Foot pain, bilateral 12/27/2016  . Pleuritic chest pain 12/27/2016  . Acute renal insufficiency 12/27/2016  . Pain of foot 12/27/2016  . Pulmonary embolism ([place]) 11/06/2016  . DVT (deep venous thrombosis) ([place]) 11/06/2016  . Asthma 11/06/2016    Past Surgical History:  Procedure Laterality Date  . TONSILLECTOMY      Prior to Admission medications   Medication Sig Start Date End Date Taking? Authorizing Provider  rivaroxaban (XARELTO) 20 MG TABS tablet Take 1 tablet (20 mg total) by mouth every evening. Patient taking differently: Take 10 mg by mouth every evening.  02/28/17 02/28/18 Yes McShane, Gerda Diss, MD  albuterol (PROVENTIL HFA;VENTOLIN HFA)  108 (90 Base) MCG/ACT inhaler Inhale 2 puffs into the lungs every 6 (six) hours as needed for wheezing or shortness of breath. Patient not taking: Reported on 04/15/2017 12/16/15   Beers, Pierce Crane, PA-C  allopurinol (ZYLOPRIM) 100 MG tablet Take 1 tablet (100 mg total) by mouth daily. Patient not taking: Reported on 04/15/2017 12/28/16 01/17/17  Vaughan Basta, MD  naproxen (NAPROSYN) 500 MG tablet Take 1 tablet (500 mg total) by mouth 2 (two) times daily with a meal. Patient not taking: Reported on 07/26/2017 04/15/17   Lavonia Drafts, MD  oxyCODONE (OXY IR/ROXICODONE) 5 MG immediate release tablet Take 1 tablet (5 mg total) by mouth every 6 (six) hours as needed for moderate pain or severe pain. Patient not taking: Reported on 04/15/2017 12/28/16   Vaughan Basta, MD    Allergies Hydrocodone  Family History  Problem Relation Age of Onset  . Cancer Mother   . Diabetes Father   . Cancer Other   . Stroke Other   . Diabetes Other     Social History Social History   Tobacco Use  . Smoking status: Current Every Day Smoker    Packs/day: 1.00    Years: 20.00    Pack years: 20.00    Types: Cigarettes  . Smokeless tobacco: Former Systems developer    Types: Chew  Substance Use Topics  . Alcohol use: Yes    Comment: occ  . Drug use: No    Review of Systems Constitutional: No fever/chills Eyes: No visual changes. ENT: Positive for facial swelling. Cardiovascular:  Denies chest pain. Respiratory: Denies shortness of breath. Gastrointestinal: No abdominal pain.  No nausea, no vomiting.  No diarrhea.   Genitourinary: Negative for dysuria. Musculoskeletal: Positive for swelling to his midback.  Skin: Negative for rash. Neurological: Negative for headaches, focal weakness or numbness.  ____________________________________________   PHYSICAL EXAM:  VITAL SIGNS: ED Triage Vitals  Enc Vitals Group     BP 07/26/17 1323 130/72     Pulse Rate 07/26/17 1323 78     Resp 07/26/17 1323  18     Temp 07/26/17 1327 98.2 F (36.8 C)     Temp Source 07/26/17 1327 Oral     SpO2 07/26/17 1323 100 %     Weight 07/26/17 1323 180 lb (81.6 kg)     Height 07/26/17 1323 5' 11"  (1.803 m)     Head Circumference --      Peak Flow --      Pain Score 07/26/17 1323 7   Constitutional: Alert and oriented. Well appearing and in no distress. Eyes: Conjunctivae are normal.  ENT   Head: Normocephalic and atraumatic.   Nose: No congestion/rhinnorhea.   Mouth/Throat: Obvious swelling to right cheek and upper and lower lips. Edentulous. Mucous membranes are moist.   Neck: No stridor. Hematological/Lymphatic/Immunilogical: No cervical lymphadenopathy. Cardiovascular: Normal rate, regular rhythm.  No murmurs, rubs, or gallops. Respiratory: Normal respiratory effort without tachypnea nor retractions. Breath sounds are clear and equal bilaterally. No wheezes/rales/rhonchi. Gastrointestinal: Soft and non tender. No rebound. No guarding.  Genitourinary: Deferred Musculoskeletal: Normal range of motion in all extremities. No lower extremity edema. Neurologic:  Normal speech and language. No gross focal neurologic deficits are appreciated.  Skin:  Skin is warm, dry and intact. Small area of swelling to right mid back.  Psychiatric: Mood and affect are normal. Speech and behavior are normal. Patient exhibits appropriate insight and judgment.  ____________________________________________    LABS (pertinent positives/negatives)  INR 1.02 PTT 29 CBC wbc 6.8, hgb 14.3 CMP na 134, cr 1.09, AST 473, ALT 738, alk phos 195  ____________________________________________   EKG  None  ____________________________________________    RADIOLOGY  CT max/face Right side facial edema, no abscess  ____________________________________________   PROCEDURES  Procedures  ____________________________________________   INITIAL IMPRESSION / ASSESSMENT AND PLAN / ED COURSE  Pertinent  labs & imaging results that were available during my care of the patient were reviewed by me and considered in my medical decision making (see chart for details).  Patient presented to the emergency department today because of concern for facial swelling. Initial exam patient with more right sided swelling than left. During the stay here in the emergency department the swelling did become more bilateral. He was given steroids and benadryl in the emergency department and his swelling did start to improve. The patient had a ct scan done to evaluate for abscess vs fluid collection. This was negative. Blood work did show elevated LFTs of uncertain etiology or significance. Discussed with Dr. Alice Reichert with GI who did not think any immediate further work up of the LFTs were required, however did recommend follow up. Given that patient's swelling was improving here in the emergency department did feel it was safe for patient to be discharged. Will give prescription for prednisone. Will give GI and PCP follow up information. Discussed plan and return precautions with the patient.   ____________________________________________   FINAL CLINICAL IMPRESSION(S) / ED DIAGNOSES  Final diagnoses:  Facial swelling  Elevated liver enzymes     Note: This  dictation was prepared with Dragon dictation. Any transcriptional errors that result from this process are unintentional     Nance Pear, MD 07/26/17 [phone]

## 2017-07-26 NOTE — ED Notes (Signed)
Pt stated "I aint ate all day." Asked Dr. Derrill KayGoodman if pt could eat and he stated pt could eat. Given applesauce and sprite.

## 2017-08-14 ENCOUNTER — Emergency Department
Admission: EM | Admit: 2017-08-14 | Discharge: 2017-08-14 | Disposition: A | Discharge status: Home / Self Care | Payer: Self-pay | Attending: Emergency Medicine | Admitting: Emergency Medicine

## 2017-08-14 ENCOUNTER — Emergency Department: Payer: Self-pay

## 2017-08-14 ENCOUNTER — Other Ambulatory Visit: Payer: Self-pay

## 2017-08-14 DIAGNOSIS — S8011XA Contusion of right lower leg, initial encounter: Secondary | ICD-10-CM | POA: Insufficient documentation

## 2017-08-14 DIAGNOSIS — Y92009 Unspecified place in unspecified non-institutional (private) residence as the place of occurrence of the external cause: Secondary | ICD-10-CM

## 2017-08-14 DIAGNOSIS — J45909 Unspecified asthma, uncomplicated: Secondary | ICD-10-CM | POA: Insufficient documentation

## 2017-08-14 DIAGNOSIS — Y9301 Activity, walking, marching and hiking: Secondary | ICD-10-CM | POA: Insufficient documentation

## 2017-08-14 DIAGNOSIS — Y92007 Garden or yard of unspecified non-institutional (private) residence as the place of occurrence of the external cause: Secondary | ICD-10-CM | POA: Insufficient documentation

## 2017-08-14 DIAGNOSIS — S7011XA Contusion of right thigh, initial encounter: Secondary | ICD-10-CM | POA: Insufficient documentation

## 2017-08-14 DIAGNOSIS — S7001XA Contusion of right hip, initial encounter: Secondary | ICD-10-CM | POA: Insufficient documentation

## 2017-08-14 DIAGNOSIS — Y998 Other external cause status: Secondary | ICD-10-CM | POA: Insufficient documentation

## 2017-08-14 DIAGNOSIS — F17210 Nicotine dependence, cigarettes, uncomplicated: Secondary | ICD-10-CM | POA: Insufficient documentation

## 2017-08-14 DIAGNOSIS — Z79899 Other long term (current) drug therapy: Secondary | ICD-10-CM | POA: Insufficient documentation

## 2017-08-14 DIAGNOSIS — W1842XA Slipping, tripping and stumbling without falling due to stepping into hole or opening, initial encounter: Secondary | ICD-10-CM | POA: Insufficient documentation

## 2017-08-14 DIAGNOSIS — W19XXXA Unspecified fall, initial encounter: Secondary | ICD-10-CM

## 2017-08-14 DIAGNOSIS — Z7901 Long term (current) use of anticoagulants: Secondary | ICD-10-CM | POA: Insufficient documentation

## 2017-08-14 MED ORDER — TRAMADOL HCL 50 MG PO TABS: 50.0000 mg | ORAL_TABLET | Freq: Four times a day (QID) | ORAL | 0 refills | Status: DC | PRN

## 2017-08-14 MED ORDER — OXYCODONE HCL 5 MG PO TABS
5.0000 mg | ORAL_TABLET | Freq: Once | ORAL | Status: AC
  Administered 2017-08-14: 5 mg via ORAL
  Filled 2017-08-14: qty 1

## 2017-08-14 NOTE — ED Notes (Addendum)
Patient stated his mother Nelda Severe(Jane Mitchel) was in lobby, went to lobby and called, no response.   Asked patient to call his mother again and tell her to come to lobby

## 2017-08-14 NOTE — ED Notes (Signed)

## 2017-08-14 NOTE — ED Notes (Addendum)
Went to lobby and called for patients mother Jane Mitchel, no response. Asked patient to call his mother again and tell her to come to lobby  

## 2017-08-14 NOTE — Discharge Instructions (Signed)
Follow-up with your regular doctor if any continued problems. Today you are given a prescription for tramadol to be taken every 6 hours if needed for pain. You may also use ice to your hip and lower leg as needed for discomfort. Follow-up with your regular doctor if any continued pain medication is needed.

## 2017-08-14 NOTE — ED Provider Notes (Signed)
[facility] Emergency Department Provider Note  ____________________________________________   First MD Initiated Contact with Patient 08/14/17 1445     (approximate)  I have reviewed the triage vital signs and the nursing notes.   HISTORY  Chief Complaint Leg Pain   HPI Cherrie Gauzeimothy E Bratcher is a 41 y.o. male patient is brought in today by his mother after he stepped in a hole last evening causing him to fall. Patient complains of right hip pain radiating down to his right ankle. Patient states that he did not hit his head and there was no loss of consciousness. Patient states he "crawled around the house last evening". He has not taken any over-the-counter medication for his pain or used ice.currently rates his pain as 10 over 10.  Patient states he was able to dress himself including socks and shoes without any assistance this morning.   Past Medical History:  Diagnosis Date  . Asthma   . Chronic dental pain   . Chronic shoulder pain   . DVT (deep venous thrombosis) ([facility])   . Headache   . Pulmonary embolism ([facility])   . Right-sided back pain     Patient Active Problem List   Diagnosis Date Noted  . 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 Foot pain, bilateral 12/27/2016  . Pleuritic chest pain 12/27/2016  . Acute renal insufficiency 12/27/2016  . Pain of foot 12/27/2016  . Pulmonary embolism ([facility]) 11/06/2016  . DVT (deep venous thrombosis) ([facility]) 11/06/2016  . Asthma 11/06/2016    Past Surgical History:  Procedure Laterality Date  . TONSILLECTOMY      Prior to Admission medications   Medication Sig Start Date End Date Taking? Authorizing Provider  albuterol (PROVENTIL HFA;VENTOLIN HFA) 108 (90 Base) MCG/ACT inhaler Inhale 2 puffs into the lungs every 6 (six) hours as needed for wheezing or shortness of breath.   Yes [provider]  rivaroxaban (XARELTO) 20 MG TABS tablet Take 20 mg by mouth daily with supper.   Yes [provider]    predniSONE (DELTASONE) 20 MG tablet Take 2 tablets (40 mg total) by mouth daily. 07/26/17   Phineas SemenGoodman, Graydon, MD  traMADol (ULTRAM) 50 MG tablet Take 1 tablet (50 mg total) by mouth every 6 (six) hours as needed. 08/14/17   Tommi RumpsSummers, Elysa Womac L, PA-C    Allergies Hydrocodone  Family History  Problem Relation Age of Onset  . Cancer Mother   . Diabetes Father   . Cancer Other   . Stroke Other   . Diabetes Other     Social History Social History   Tobacco Use  . Smoking status: Current Every Day Smoker    Packs/day: 1.00    Years: 20.00    Pack years: 20.00    Types: Cigarettes  . Smokeless tobacco: Former NeurosurgeonUser    Types: Chew  Substance Use Topics  . Alcohol use: Yes    Comment: occ  . Drug use: No    Review of Systems Constitutional: No fever/chills Eyes: No visual changes. ENT: no trauma. Cardiovascular: Denies chest pain. Respiratory: Denies shortness of breath. Gastrointestinal: No abdominal pain.  No nausea, no vomiting.  No diarrhea.  No constipation. Musculoskeletal: positive for right hip pain, right leg pain, right knee pain, right ankle pain. Skin: Negative for abrasions or ecchymosis. Neurological: Negative for headaches, focal weakness or numbness. ____________________________________________   PHYSICAL EXAM:  VITAL SIGNS: ED Triage Vitals  Enc Vitals Group     BP 08/14/17 1350 98/62  Pulse Rate 08/14/17 1350 87     Resp 08/14/17 1350 14     Temp 08/14/17 1350 98.4 F (36.9 C)     Temp Source 08/14/17 1350 Oral     SpO2 08/14/17 1350 98 %     Weight 08/14/17 1350 180 lb (81.6 kg)     Height 08/14/17 1350 5\' 11"  (1.803 m)     Head Circumference --      Peak Flow --      Pain Score 08/14/17 1357 10     Pain Loc --      Pain Edu? --      Excl. in GC? --    Constitutional: Alert and oriented. Well appearing and in no acute distress. Eyes: Conjunctivae are normal.  Head: Atraumatic. Nose: no trauma. Neck: No stridor.  No cervical tenderness  on palpation posteriorly. Cardiovascular: Normal rate, regular rhythm. Grossly normal heart sounds.  Good peripheral circulation. Respiratory: Normal respiratory effort.  No retractions. Lungs CTAB. Gastrointestinal: Soft and nontender. No distention. Bowel sounds normoactive 4 quadrants. Musculoskeletal: examination of the right hip there is no deformity or soft tissue swelling present. There is no ecchymosis or abrasions seen. There is tenderness to palpation on the lateral and posterior aspect of the right hip. There is generalized tenderness on palpation of the knee anteriorly. No effusion is present. Range of motion is restricted secondary to patient's pain. Examination of the lower extremity there is no soft tissue swelling, abrasions, ecchymosis or erythema. No soft tissue swelling or deformity is noted of the right ankle. Range of motion is normal in flexion and extension. Motor sensory function intact distal to the injury. Neurologic:  Normal speech and language. No gross focal neurologic deficits are appreciated.  Skin:  Skin is warm, dry and intact. No ecchymosis, abrasions, erythema noted. Psychiatric: Mood and affect are normal. Speech and behavior are normal.  ____________________________________________   LABS (all labs ordered are listed, but only abnormal results are displayed)  Labs Reviewed - No data to display   RADIOLOGY  Dg Tibia/fibula Right  Result Date: 08/14/2017 CLINICAL DATA:  Right extremity pain post fall. EXAM: RIGHT TIBIA AND FIBULA - 2 VIEW COMPARISON:  None. FINDINGS: There is no evidence of fracture or other focal bone lesions. Soft tissues are unremarkable. IMPRESSION: Negative. Electronically Signed   By: Ted Mcalpine M.D.   On: 08/14/2017 15:40   Dg Hip Unilat W Or Wo Pelvis 2-3 Views Right  Result Date: 08/14/2017 CLINICAL DATA:  Right hip 2 right ankle pain post fall. EXAM: DG HIP (WITH OR WITHOUT PELVIS) 2-3V RIGHT COMPARISON:  None.  FINDINGS: There is no evidence of hip fracture or dislocation. There is no evidence of arthropathy or other focal bone abnormality. IMPRESSION: Negative. Electronically Signed   By: Ted Mcalpine M.D.   On: 08/14/2017 15:39    ____________________________________________   PROCEDURES  Procedure(s) performed: None  Procedures  Critical Care performed: No  ____________________________________________   INITIAL IMPRESSION / ASSESSMENT AND PLAN / ED COURSE  As part of my medical decision making, I reviewed the following data within the electronic MEDICAL RECORD NUMBER Notes from prior ED visits and [place] Controlled Substance Database  Patient was given Percocet in the department. X-ray results were discussed with him. Patient was discharged with a prescription for tramadol 50 mg 1 every 6 hours as needed for pain. He is to use ice to his extremity as needed for discomfort or any swelling. Patient had to be held in the  ED as his mother left and patient still needed a ride home.  ____________________________________________   FINAL CLINICAL IMPRESSION(S) / ED DIAGNOSES  Final diagnoses:  Contusion of right hip and thigh, initial encounter  Contusion of right lower leg, initial encounter  Fall at home, initial encounter     ED Discharge Orders        Ordered    traMADol (ULTRAM) 50 MG tablet  Every 6 hours PRN     08/14/17 1634       Note:  This document was prepared using Dragon voice recognition software and may include unintentional dictation errors.    Tommi RumpsSummers, Anyjah Roundtree L, PA-C 08/14/17 Merrily [id number]    Governor RooksLord, Rebecca, MD 08/18/17 [id number]

## 2017-08-14 NOTE — ED Notes (Addendum)
Went to lobby and called for patients mother Nelda SevereJane Mitchel, no response. Asked patient to call his mother again and tell her to come to lobby

## 2017-08-14 NOTE — ED Triage Notes (Signed)
Pt states that he stepped into a hole last night and is having rt leg pain, states that the pain radiates from the rt ankle, the right knee, and rt thigh, pt reports that he is currently on xarelto for multiple blood clots in his lungs bilat.

## 2017-08-14 NOTE — ED Notes (Signed)
See triage note  States he stepped in a hole last pm  Having pain to right ankle/knee  States pain is moving up his leg  And he was not able to bear wt

## 2017-11-25 ENCOUNTER — Other Ambulatory Visit: Payer: Self-pay

## 2017-11-25 ENCOUNTER — Emergency Department: Payer: Self-pay

## 2017-11-25 ENCOUNTER — Emergency Department
Admission: EM | Admit: 2017-11-25 | Discharge: 2017-11-25 | Disposition: A | Discharge status: Home / Self Care | Payer: Self-pay | Attending: Emergency Medicine | Admitting: Emergency Medicine

## 2017-11-25 ENCOUNTER — Encounter: Payer: Self-pay | Admitting: Emergency Medicine

## 2017-11-25 DIAGNOSIS — F17210 Nicotine dependence, cigarettes, uncomplicated: Secondary | ICD-10-CM | POA: Insufficient documentation

## 2017-11-25 DIAGNOSIS — J410 Simple chronic bronchitis: Secondary | ICD-10-CM | POA: Insufficient documentation

## 2017-11-25 DIAGNOSIS — J069 Acute upper respiratory infection, unspecified: Secondary | ICD-10-CM | POA: Insufficient documentation

## 2017-11-25 DIAGNOSIS — B9789 Other viral agents as the cause of diseases classified elsewhere: Secondary | ICD-10-CM | POA: Insufficient documentation

## 2017-11-25 DIAGNOSIS — Z79899 Other long term (current) drug therapy: Secondary | ICD-10-CM | POA: Insufficient documentation

## 2017-11-25 LAB — INFLUENZA PANEL BY PCR (TYPE A & B)
Influenza A By PCR: NEGATIVE
Influenza B By PCR: NEGATIVE

## 2017-11-25 MED ORDER — IBUPROFEN 800 MG PO TABS
800.0000 mg | ORAL_TABLET | Freq: Once | ORAL | Status: AC
  Administered 2017-11-25: 800 mg via ORAL
  Filled 2017-11-25: qty 1

## 2017-11-25 MED ORDER — ETODOLAC 500 MG PO TABS: 500.0000 mg | ORAL_TABLET | Freq: Two times a day (BID) | ORAL | 0 refills | Status: DC

## 2017-11-25 MED ORDER — IPRATROPIUM-ALBUTEROL 0.5-2.5 (3) MG/3ML IN SOLN
3.0000 mL | Freq: Once | RESPIRATORY_TRACT | Status: AC
  Administered 2017-11-25: 3 mL via RESPIRATORY_TRACT
  Filled 2017-11-25: qty 3

## 2017-11-25 MED ORDER — ONDANSETRON 8 MG PO TBDP
8.0000 mg | ORAL_TABLET | Freq: Once | ORAL | Status: AC
  Administered 2017-11-25: 8 mg via ORAL
  Filled 2017-11-25: qty 1

## 2017-11-25 MED ORDER — MORPHINE SULFATE (PF) 4 MG/ML IV SOLN
4.0000 mg | Freq: Once | INTRAVENOUS | Status: AC
  Administered 2017-11-25: 4 mg via INTRAMUSCULAR
  Filled 2017-11-25: qty 1

## 2017-11-25 MED ORDER — IBUPROFEN 800 MG PO TABS
ORAL_TABLET | ORAL | Status: AC
  Filled 2017-11-25: qty 1

## 2017-11-25 MED ORDER — METHYLPREDNISOLONE SODIUM SUCC 125 MG IJ SOLR
125.0000 mg | Freq: Once | INTRAMUSCULAR | Status: AC
Start: 2017-11-25 — End: 2017-11-25
  Administered 2017-11-25: 125 mg via INTRAMUSCULAR
  Filled 2017-11-25: qty 2

## 2017-11-25 MED ORDER — PSEUDOEPH-BROMPHEN-DM 30-2-10 MG/5ML PO SYRP: 10.0000 mL | ORAL_SOLUTION | Freq: Four times a day (QID) | ORAL | 0 refills | Status: DC | PRN

## 2017-11-25 MED ORDER — CYCLOBENZAPRINE HCL 10 MG PO TABS: 10.0000 mg | ORAL_TABLET | Freq: Three times a day (TID) | ORAL | 0 refills | Status: DC | PRN

## 2017-11-25 MED ORDER — PREDNISONE 50 MG PO TABS: 50.0000 mg | ORAL_TABLET | Freq: Every day | ORAL | 0 refills | Status: DC

## 2017-11-25 NOTE — ED Provider Notes (Signed)
[facility] Emergency Department Provider Note  ____________________________________________  Time seen: Approximately 6:37 PM  I have reviewed the triage vital signs and the nursing notes.   HISTORY  Chief Complaint Cough and Generalized Body Aches    HPI David Leach is a 42 y.o. male who presents the emergency department complaining of nasal congestion, fevers and chills, body aches, coughing.  Patient reports that he has a history of COPD and when he gets "sick" things typically "spiral out of control."  Patient denies any difficulty breathing.  He denies any headache, visual changes, neck pain or stiffness, chest pain, abdominal pain, nausea vomiting, diarrhea or constipation.  Patient is tried his rescue inhaler with no improvement.  No other medications for this complaint prior to arrival.  Symptoms have been ongoing for the past 2-3 days.  Symptoms began rather rapidly.  No other complaints at this time.  Patient has a history of asthma, chronic bronchitis/COPD, DVT and PE.  Patient denies any complaints with DVT or PE.  Patient is experiencing increased COPD symptoms.  Past Medical History:  Diagnosis Date  . Asthma   . Chronic dental pain   . Chronic shoulder pain   . DVT (deep venous thrombosis) (HCC)   . Headache   . Pulmonary embolism (HCC)   . Right-sided back pain     Patient Active Problem List   Diagnosis Date Noted  . 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 Foot pain, bilateral 12/27/2016  . Pleuritic chest pain 12/27/2016  . Acute renal insufficiency 12/27/2016  . Pain of foot 12/27/2016  . Pulmonary embolism (HCC) 11/06/2016  . DVT (deep venous thrombosis) (HCC) 11/06/2016  . Asthma 11/06/2016    Past Surgical History:  Procedure Laterality Date  . TONSILLECTOMY      Prior to Admission medications   Medication Sig Start Date End Date Taking? Authorizing Provider  albuterol (PROVENTIL HFA;VENTOLIN HFA) 108 (90 Base) MCG/ACT  inhaler Inhale 2 puffs into the lungs every 6 (six) hours as needed for wheezing or shortness of breath.    [provider]  brompheniramine-pseudoephedrine-DM 30-2-10 MG/5ML syrup Take 10 mLs by mouth 4 (four) times daily as needed. 11/25/17   Tarvis Blossom, Delorise Royals, PA-C  cyclobenzaprine (FLEXERIL) 10 MG tablet Take 1 tablet (10 mg total) by mouth 3 (three) times daily as needed for muscle spasms. 11/25/17   Rosealie Reach, Delorise Royals, PA-C  etodolac (LODINE) 500 MG tablet Take 1 tablet (500 mg total) by mouth 2 (two) times daily. 11/25/17   Kasyn Stouffer, Delorise Royals, PA-C  predniSONE (DELTASONE) 50 MG tablet Take 1 tablet (50 mg total) by mouth daily with breakfast. 11/25/17   Avid Guillette, Delorise Royals, PA-C  rivaroxaban (XARELTO) 20 MG TABS tablet Take 20 mg by mouth daily with supper.    [provider]  traMADol (ULTRAM) 50 MG tablet Take 1 tablet (50 mg total) by mouth every 6 (six) hours as needed. 08/14/17   Tommi Rumps, PA-C    Allergies Hydrocodone  Family History  Problem Relation Age of Onset  . Cancer Mother   . Diabetes Father   . Cancer Other   . Stroke Other   . Diabetes Other     Social History Social History   Tobacco Use  . Smoking status: Current Every Day Smoker    Packs/day: 1.00    Years: 20.00    Pack years: 20.00    Types: Cigarettes  . Smokeless tobacco: Former Neurosurgeon    Types: Chew  Substance  Use Topics  . Alcohol use: Yes    Comment: occ  . Drug use: No     Review of Systems  Constitutional: Positive fever/chills Eyes: No visual changes. No discharge ENT: Positive for nasal congestion Cardiovascular: no chest pain. Respiratory: Positive for cough. No SOB.  Positive for wheezing Gastrointestinal: No abdominal pain.  No nausea, no vomiting.  No diarrhea.  No constipation. Musculoskeletal: Negative for musculoskeletal pain. Skin: Negative for rash, abrasions, lacerations, ecchymosis. Neurological: Negative for headaches, focal weakness or  numbness. 10-point ROS otherwise negative.  ____________________________________________   PHYSICAL EXAM:  VITAL SIGNS: ED Triage Vitals  Enc Vitals Group     BP 11/25/17 1818 (!) 111/54     Pulse Rate 11/25/17 1818 100     Resp 11/25/17 1818 18     Temp 11/25/17 1818 100.3 F (37.9 C)     Temp Source 11/25/17 1818 Oral     SpO2 11/25/17 1818 99 %     Weight 11/25/17 1818 185 lb (83.9 kg)     Height 11/25/17 1818 5\' 11"  (1.803 m)     Head Circumference --      Peak Flow --      Pain Score 11/25/17 1817 7     Pain Loc --      Pain Edu? --      Excl. in GC? --      Constitutional: Alert and oriented.  Mildly ill-appearing and in no acute distress. Eyes: Conjunctivae are normal. PERRL. EOMI. Head: Atraumatic. ENT:      Ears: EACs and TMs unremarkable bilaterally.      Nose: Moderate clear congestion/rhinnorhea.      Mouth/Throat: Mucous membranes are moist.  Neck: No stridor.  Neck is supple full range of motion Hematological/Lymphatic/Immunilogical: No cervical lymphadenopathy. Cardiovascular: Normal rate, regular rhythm. Normal S1 and S2.  Good peripheral circulation. Respiratory: Normal respiratory effort without tachypnea or retractions. Lungs with wheezing bilaterally.  No rales, rhonchi, crackles identified.Peri Jefferson air entry to the bases with no decreased or absent breath sounds. Gastrointestinal: Bowel sounds 4 quadrants. Soft and nontender to palpation. No guarding or rigidity. No palpable masses. No distention. Musculoskeletal: Full range of motion to all extremities. No gross deformities appreciated. Neurologic:  Normal speech and language. No gross focal neurologic deficits are appreciated.  Skin:  Skin is warm, dry and intact. No rash noted. Psychiatric: Mood and affect are normal. Speech and behavior are normal. Patient exhibits appropriate insight and judgement.   ____________________________________________   LABS (all labs ordered are listed, but only  abnormal results are displayed)  Labs Reviewed  INFLUENZA PANEL BY PCR (TYPE A & B)   ____________________________________________  EKG   ____________________________________________  RADIOLOGY Festus Barren [place], personally viewed and evaluated these images (plain radiographs) as part of my medical decision making, as well as reviewing the written report by the radiologist.  I concur with radiologist finding of no acute cardiopulmonary abnormality to include effusion or infiltrates.  Dg Chest 2 View  Result Date: 11/25/2017 CLINICAL DATA:  Cough and fever EXAM: CHEST - 2 VIEW COMPARISON:  CT 02/28/2017, radiograph 11/06/2016 FINDINGS: The heart size and mediastinal contours are within normal limits. Both lungs are clear. The visualized skeletal structures are unremarkable. Mild bronchitic changes. Left nipple piercing. Mild degenerative changes of the spine. IMPRESSION: No active cardiopulmonary disease.  Mild bronchitic changes. Electronically Signed   By: Jasmine Pang M.D.   On: 11/25/2017 19:34    ____________________________________________    PROCEDURES  Procedure(s) performed:    Procedures    Medications  ibuprofen (ADVIL,MOTRIN) tablet 800 mg (has no administration in time range)  ipratropium-albuterol (DUONEB) 0.5-2.5 (3) MG/3ML nebulizer solution 3 mL (has no administration in time range)  ipratropium-albuterol (DUONEB) 0.5-2.5 (3) MG/3ML nebulizer solution 3 mL (3 mLs Nebulization Given 11/25/17 1912)  methylPREDNISolone sodium succinate (SOLU-MEDROL) 125 mg/2 mL injection 125 mg (125 mg Intramuscular Given 11/25/17 1912)  ondansetron (ZOFRAN-ODT) disintegrating tablet 8 mg (8 mg Oral Given 11/25/17 1916)  morphine 4 MG/ML injection 4 mg (4 mg Intramuscular Given 11/25/17 1913)     ____________________________________________   INITIAL IMPRESSION / ASSESSMENT AND PLAN / ED COURSE  Pertinent labs & imaging results that were available during my care of the  patient were reviewed by me and considered in my medical decision making (see chart for details).  Review of the [place] CSRS was performed in accordance of the [facility] prior to dispensing any controlled drugs.     Patient's diagnosis is consistent with viral upper respiratory infection and COPD exacerbation.  Patient presented with nasal congestion, fevers and chills, body aches, cough.  Differential included viral URI, influenza, bronchitis, pneumonia.  Patient's exam was positive for wheezing but otherwise reassuring.  Patient was given 2 DuoNeb treatments in the emergency department with symptom improvement of wheezing on auscultation.  She was also given steroids in the emergency department for COPD exacerbation.  She is stable for discharge. Patient will be discharged home with prescriptions for prednisone, Bromfed cough syrup, Lodine, Flexeril. Patient is to follow up with primary care as needed or otherwise directed. Patient is given ED precautions to return to the ED for any worsening or new symptoms.     ____________________________________________  FINAL CLINICAL IMPRESSION(S) / ED DIAGNOSES  Final diagnoses:  Viral upper respiratory tract infection  Simple chronic bronchitis (HCC)      NEW MEDICATIONS STARTED DURING THIS VISIT:  ED Discharge Orders        Ordered    predniSONE (DELTASONE) 50 MG tablet  Daily with breakfast     11/25/17 2021    brompheniramine-pseudoephedrine-DM 30-2-10 MG/5ML syrup  4 times daily PRN     11/25/17 2021    etodolac (LODINE) 500 MG tablet  2 times daily     11/25/17 2021    cyclobenzaprine (FLEXERIL) 10 MG tablet  3 times daily PRN     11/25/17 2021          This chart was dictated using voice recognition software/Dragon. Despite best efforts to proofread, errors can occur which can change the meaning. Any change was purely unintentional.    Racheal PatchesCuthriell, Jazlynn Nemetz D, PA-C 11/25/17 2026    Rockne MenghiniNorman, Anne-Caroline, MD 11/25/17 [phone]

## 2017-11-25 NOTE — ED Triage Notes (Signed)
C/O cough, body aches, sinus congestion x 2 days. Has been taking theraflu and OTC cold medications without relief.  Last medicated this morning.

## 2017-11-25 NOTE — ED Notes (Addendum)
Patient transported to XR; pt wearing mask in hallway.

## 2017-11-25 NOTE — ED Notes (Signed)
Pt ambulatory upon discharge. Verbalized understanding of discharge instructions, prescriptions and follow-up care. VSS. Skin warm and dry. A&O x4.  

## 2018-06-19 ENCOUNTER — Emergency Department: Payer: Self-pay

## 2018-06-19 ENCOUNTER — Emergency Department
Admission: EM | Admit: 2018-06-19 | Discharge: 2018-06-19 | Disposition: A | Discharge status: Home / Self Care | Payer: Self-pay | Attending: Emergency Medicine | Admitting: Emergency Medicine

## 2018-06-19 ENCOUNTER — Other Ambulatory Visit: Payer: Self-pay

## 2018-06-19 DIAGNOSIS — J45909 Unspecified asthma, uncomplicated: Secondary | ICD-10-CM | POA: Insufficient documentation

## 2018-06-19 DIAGNOSIS — F17210 Nicotine dependence, cigarettes, uncomplicated: Secondary | ICD-10-CM | POA: Insufficient documentation

## 2018-06-19 DIAGNOSIS — Z79899 Other long term (current) drug therapy: Secondary | ICD-10-CM | POA: Insufficient documentation

## 2018-06-19 DIAGNOSIS — F1910 Other psychoactive substance abuse, uncomplicated: Secondary | ICD-10-CM | POA: Insufficient documentation

## 2018-06-19 DIAGNOSIS — R410 Disorientation, unspecified: Secondary | ICD-10-CM | POA: Insufficient documentation

## 2018-06-19 LAB — COMPREHENSIVE METABOLIC PANEL
ALT: 116 U/L — AB (ref 0–44)
ANION GAP: 12 (ref 5–15)
AST: 133 U/L — ABNORMAL HIGH (ref 15–41)
Albumin: 4.1 g/dL (ref 3.5–5.0)
Alkaline Phosphatase: 79 U/L (ref 38–126)
BUN: 33 mg/dL — ABNORMAL HIGH (ref 6–20)
CALCIUM: 8.8 mg/dL — AB (ref 8.9–10.3)
CHLORIDE: 97 mmol/L — AB (ref 98–111)
CO2: 25 mmol/L (ref 22–32)
CREATININE: 1.88 mg/dL — AB (ref 0.61–1.24)
GFR, EST AFRICAN AMERICAN: 50 mL/min — AB (ref >60)
GFR, EST NON AFRICAN AMERICAN: 43 mL/min — AB (ref >60)
Glucose, Bld: 86 mg/dL (ref 70–99)
Potassium: 4.1 mmol/L (ref 3.5–5.1)
SODIUM: 134 mmol/L — AB (ref 135–145)
Total Bilirubin: 1.5 mg/dL — ABNORMAL HIGH (ref 0.3–1.2)
Total Protein: 7.2 g/dL (ref 6.5–8.1)

## 2018-06-19 LAB — URINE DRUG SCREEN, QUALITATIVE (ARMC ONLY)
AMPHETAMINES, UR SCREEN: POSITIVE — AB
BARBITURATES, UR SCREEN: NOT DETECTED
BENZODIAZEPINE, UR SCRN: NOT DETECTED
COCAINE METABOLITE, UR ~~LOC~~: POSITIVE — AB
Cannabinoid 50 Ng, Ur ~~LOC~~: NOT DETECTED
MDMA (Ecstasy)Ur Screen: NOT DETECTED
METHADONE SCREEN, URINE: NOT DETECTED
OPIATE, UR SCREEN: NOT DETECTED
Phencyclidine (PCP) Ur S: NOT DETECTED
TRICYCLIC, UR SCREEN: NOT DETECTED

## 2018-06-19 LAB — TROPONIN I
Troponin I: 0.07 ng/mL (ref <0.03)
Troponin I: 0.06 ng/mL (ref <0.03)

## 2018-06-19 LAB — CBC WITH DIFFERENTIAL/PLATELET
ABS IMMATURE GRANULOCYTES: 0.06 10*3/uL (ref 0.00–0.07)
Basophils Absolute: 0.1 10*3/uL (ref 0.0–0.1)
Basophils Relative: 1 %
Eosinophils Absolute: 0.2 10*3/uL (ref 0.0–0.5)
Eosinophils Relative: 2 %
HCT: 37 % — ABNORMAL LOW (ref 39.0–52.0)
Hemoglobin: 12.6 g/dL — ABNORMAL LOW (ref 13.0–17.0)
IMMATURE GRANULOCYTES: 1 %
LYMPHS PCT: 24 %
Lymphs Abs: 3 10*3/uL (ref 0.7–4.0)
MCH: 31.1 pg (ref 26.0–34.0)
MCHC: 34.1 g/dL (ref 30.0–36.0)
MCV: 91.4 fL (ref 80.0–100.0)
MONO ABS: 2 10*3/uL — AB (ref 0.1–1.0)
MONOS PCT: 15 %
NEUTROS ABS: 7.5 10*3/uL (ref 1.7–7.7)
Neutrophils Relative %: 57 %
PLATELETS: 245 10*3/uL (ref 150–400)
RBC: 4.05 MIL/uL — AB (ref 4.22–5.81)
RDW: 13.5 % (ref 11.5–15.5)
WBC: 12.9 10*3/uL — AB (ref 4.0–10.5)
nRBC: 0 % (ref 0.0–0.2)

## 2018-06-19 MED ORDER — LORAZEPAM 2 MG/ML IJ SOLN
INTRAMUSCULAR | Status: AC
  Filled 2018-06-19: qty 1

## 2018-06-19 MED ORDER — SODIUM CHLORIDE 0.9 % IV BOLUS
1000.0000 mL | Freq: Once | INTRAVENOUS | Status: AC
  Administered 2018-06-19: 1000 mL via INTRAVENOUS

## 2018-06-19 MED ORDER — LORAZEPAM 2 MG/ML IJ SOLN
1.0000 mg | Freq: Once | INTRAMUSCULAR | Status: AC
  Administered 2018-06-19: 1 mg via INTRAVENOUS

## 2018-06-19 MED ORDER — LORAZEPAM 2 MG/ML IJ SOLN: 1.0000 mg | Freq: Once | INTRAMUSCULAR | Status: DC

## 2018-06-19 NOTE — ED Notes (Signed)
Date and time results received: 06/19/18 0751 (use smartphrase ".now" to insert current time)  Test: Troponin Critical Value: 0.07  Name of Provider Notified: Scotty Court  Orders Received? Or Actions Taken?: Actions Taken: No new orders

## 2018-06-19 NOTE — ED Notes (Signed)
Pt continues to have periods of apnea sats dropping into the low 80's. Pt even with stimuli is not returning to normal sats. Pt placed back on NRB mask.

## 2018-06-19 NOTE — ED Notes (Signed)
Pt agitated, thrashing around in bed, will not participate in care. md at bedside.

## 2018-06-19 NOTE — ED Triage Notes (Signed)
Pt arrives jerking around, flailing, complaining of chest pain. Rapid repeated speech. Pt appears to be under the influence of a stimulent.

## 2018-06-19 NOTE — ED Notes (Signed)
Report to stephanie, rn

## 2018-06-19 NOTE — ED Notes (Signed)
EDP at the bedside explaining the situation to the pt son. EDP removed NRB and place pt on 3.5L [place]. Pt is maintaining O2 sats 100% at this time. SR noted on the monitor.

## 2018-06-19 NOTE — ED Notes (Signed)
Seizure pads placed to bilateral siderails for pt safety. Bed in low and locked position with siderails up. Clothing removed and placed in belonging bag at bedside.

## 2018-06-19 NOTE — Discharge Instructions (Signed)
Please follow-up with [facility] or another mental health provider.
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
## 2018-06-19 NOTE — ED Notes (Signed)
Pt on end tidal CO2.

## 2018-06-19 NOTE — ED Notes (Signed)
Pt placed on a non rebreather, pt is now sleeping.

## 2018-06-19 NOTE — ED Notes (Signed)
Pt with enlarged left groin lymph node noted during in and out cath for UDS. Md notified of lower extremity skin red areas and enlarged lymph node.
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
## 2018-09-24 ENCOUNTER — Emergency Department
Admission: EM | Admit: 2018-09-24 | Discharge: 2018-09-24 | Disposition: A | Discharge status: Home / Self Care | Payer: Self-pay | Attending: Emergency Medicine | Admitting: Emergency Medicine

## 2018-09-24 ENCOUNTER — Other Ambulatory Visit: Payer: Self-pay

## 2018-09-24 ENCOUNTER — Emergency Department: Payer: Self-pay

## 2018-09-24 DIAGNOSIS — J45901 Unspecified asthma with (acute) exacerbation: Secondary | ICD-10-CM | POA: Insufficient documentation

## 2018-09-24 DIAGNOSIS — F17210 Nicotine dependence, cigarettes, uncomplicated: Secondary | ICD-10-CM | POA: Insufficient documentation

## 2018-09-24 DIAGNOSIS — M25511 Pain in right shoulder: Secondary | ICD-10-CM | POA: Insufficient documentation

## 2018-09-24 LAB — CBC
HCT: 42.9 % (ref 39.0–52.0)
Hemoglobin: 13.5 g/dL (ref 13.0–17.0)
MCH: 29.7 pg (ref 26.0–34.0)
MCHC: 31.5 g/dL (ref 30.0–36.0)
MCV: 94.5 fL (ref 80.0–100.0)
PLATELETS: 290 10*3/uL (ref 150–400)
RBC: 4.54 MIL/uL (ref 4.22–5.81)
RDW: 13.5 % (ref 11.5–15.5)
WBC: 7.4 10*3/uL (ref 4.0–10.5)
nRBC: 0 % (ref 0.0–0.2)

## 2018-09-24 LAB — BASIC METABOLIC PANEL
Anion gap: 4 — ABNORMAL LOW (ref 5–15)
BUN: 16 mg/dL (ref 6–20)
CALCIUM: 8.7 mg/dL — AB (ref 8.9–10.3)
CO2: 28 mmol/L (ref 22–32)
Chloride: 105 mmol/L (ref 98–111)
Creatinine, Ser: 1.07 mg/dL (ref 0.61–1.24)
GFR calc Af Amer: >60 mL/min (ref >60)
GLUCOSE: 107 mg/dL — AB (ref 70–99)
Potassium: 4.6 mmol/L (ref 3.5–5.1)
Sodium: 137 mmol/L (ref 135–145)

## 2018-09-24 LAB — TROPONIN I

## 2018-09-24 MED ORDER — CYCLOBENZAPRINE HCL 10 MG PO TABS: 10.0000 mg | ORAL_TABLET | Freq: Three times a day (TID) | ORAL | 0 refills | Status: DC | PRN

## 2018-09-24 MED ORDER — PREDNISONE 20 MG PO TABS
60.0000 mg | ORAL_TABLET | Freq: Once | ORAL | Status: AC
  Administered 2018-09-24: 60 mg via ORAL
  Filled 2018-09-24: qty 3

## 2018-09-24 MED ORDER — IPRATROPIUM-ALBUTEROL 0.5-2.5 (3) MG/3ML IN SOLN
3.0000 mL | Freq: Once | RESPIRATORY_TRACT | Status: AC
  Administered 2018-09-24: 3 mL via RESPIRATORY_TRACT
  Filled 2018-09-24: qty 3

## 2018-09-24 MED ORDER — PREDNISONE 10 MG (21) PO TBPK: ORAL_TABLET | ORAL | 0 refills | Status: DC

## 2018-09-24 MED ORDER — SODIUM CHLORIDE 0.9% FLUSH: 3.0000 mL | Freq: Once | INTRAVENOUS | Status: DC

## 2018-09-24 NOTE — ED Provider Notes (Signed)
[facility] Emergency Department Provider Note       Time seen: ----------------------------------------- 2:18 PM on 09/24/2018 -----------------------------------------   I have reviewed the triage vital signs and the nursing notes.  HISTORY   Chief Complaint Shortness of Breath   HPI David Leach is a 43 y.o. male with a history of asthma, chronic dental pain, chronic shoulder pain, DVT, headache, PE who presents to the ED for shortness of breath for about a month.  Patient reports he has asthma.  He also reports severe pain in his right shoulder that shoots down.  He complains of popping whenever he moves his right shoulder and also pain in his neck.  He denies any recent injury.  He does work as a Education administrator.  Past Medical History:  Diagnosis Date  . Asthma   . Chronic dental pain   . Chronic shoulder pain   . DVT (deep venous thrombosis) (HCC)   . Headache   . Pulmonary embolism (HCC)   . Right-sided back pain     Patient Active Problem List   Diagnosis Date Noted  . 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 Foot pain, bilateral 12/27/2016  . Pleuritic chest pain 12/27/2016  . Acute renal insufficiency 12/27/2016  . Pain of foot 12/27/2016  . Pulmonary embolism (HCC) 11/06/2016  . DVT (deep venous thrombosis) (HCC) 11/06/2016  . Asthma 11/06/2016    Past Surgical History:  Procedure Laterality Date  . TONSILLECTOMY      Allergies Hydrocodone  Social History Social History   Tobacco Use  . Smoking status: Current Every Day Smoker    Packs/day: 1.00    Years: 20.00    Pack years: 20.00    Types: Cigarettes  . Smokeless tobacco: Former Neurosurgeon    Types: Chew  Substance Use Topics  . Alcohol use: Yes    Comment: occ  . Drug use: No   Review of Systems Constitutional: Negative for fever. Cardiovascular: Negative for chest pain. Respiratory: Positive for cough and shortness of breath Gastrointestinal: Negative for abdominal pain,  vomiting and diarrhea. Musculoskeletal: Positive for acute on chronic right shoulder pain Skin: Negative for rash. Neurological: Negative for headaches, focal weakness or numbness.  All systems negative/normal/unremarkable except as stated in the HPI  ____________________________________________   PHYSICAL EXAM:  VITAL SIGNS: ED Triage Vitals  Enc Vitals Group     BP 09/24/18 1242 113/65     Pulse Rate 09/24/18 1242 96     Resp 09/24/18 1242 18     Temp 09/24/18 1242 98.1 F (36.7 C)     Temp src --      SpO2 09/24/18 1242 98 %     Weight 09/24/18 1239 185 lb (83.9 kg)     Height 09/24/18 1239 5\' 11"  (1.803 m)     Head Circumference --      Peak Flow --      Pain Score 09/24/18 1239 10     Pain Loc --      Pain Edu? --      Excl. in GC? --    Constitutional: Alert and oriented. Well appearing and in no distress. Eyes: Conjunctivae are normal. Normal extraocular movements. ENT      Head: Normocephalic and atraumatic.      Nose: No congestion/rhinnorhea.      Mouth/Throat: Mucous membranes are moist.      Neck: No stridor. Cardiovascular: Normal rate, regular rhythm. No murmurs, rubs, or gallops. Respiratory: Normal respiratory effort without tachypnea  nor retractions. Breath sounds are clear and equal bilaterally. No wheezes/rales/rhonchi. Gastrointestinal: Soft and nontender. Normal bowel sounds Musculoskeletal: Mild pain and crepitus with range of motion of the right shoulder. Neurologic:  Normal speech and language. No gross focal neurologic deficits are appreciated.  Skin:  Skin is warm, dry and intact. No rash noted. Psychiatric: Mood and affect are normal. Speech and behavior are normal.  ____________________________________________  EKG: Interpreted by me.  Sinus rhythm with sinus arrhythmia, rate is 90 bpm, incomplete right bundle branch block, normal PR interval, normal QRS, normal QT  ____________________________________________  ED COURSE:  As part of my  medical decision making, I reviewed the following data within the electronic MEDICAL RECORD NUMBER History obtained from family if available, nursing notes, old chart and ekg, as well as notes from prior ED visits. Patient presented for shoulder pain as well as shortness of breath and cough, we will assess with labs and imaging as indicated at this time.   Procedures ____________________________________________   LABS (pertinent positives/negatives)  Labs Reviewed  BASIC METABOLIC PANEL - Abnormal; Notable for the following components:      Result Value   Glucose, Bld 107 (*)    Calcium 8.7 (*)    Anion gap 4 (*)    All other components within normal limits  CBC  TROPONIN I    RADIOLOGY Images were viewed by me  Chest x-ray is unremarkable  ____________________________________________   DIFFERENTIAL DIAGNOSIS   Asthma, COPD, pneumonia, upper chronic bronchitis, chronic pain, strain, rotator cuff injury  FINAL ASSESSMENT AND PLAN  Asthma exacerbation, shoulder pain   Plan: The patient had presented for shortness of breath and right shoulder pain. Patient's labs were reassuring. Patient's imaging also reassuring.  He will be discharged with Flexeril and prednisone.  He was given an additional breathing treatment and prednisone orally here.  He is cleared for outpatient follow-up.   Ulice Dash, MD    Note: This note was generated in part or whole with voice recognition software. Voice recognition is usually quite accurate but there are transcription errors that can and very often do occur. I apologize for any typographical errors that were not detected and corrected.     Emily Filbert, MD 09/24/18 [phone]

## 2018-09-24 NOTE — ED Triage Notes (Addendum)
Pt comes via POV from home with c/o SOB for about a month. Pt states he has asthma.  Pt also states severe pain in right arm shoots down and is popping. Pt states it is neck shoulder and arm pain that is aggravating. Pt denies any recent injury.  Pt states little chest pain. Pt also states dry cough.

## 2018-12-10 ENCOUNTER — Emergency Department: Payer: Self-pay

## 2018-12-10 ENCOUNTER — Other Ambulatory Visit: Payer: Self-pay

## 2018-12-10 ENCOUNTER — Emergency Department
Admission: EM | Admit: 2018-12-10 | Discharge: 2018-12-10 | Disposition: A | Discharge status: Home / Self Care | Payer: Self-pay | Attending: Emergency Medicine | Admitting: Emergency Medicine

## 2018-12-10 DIAGNOSIS — F17210 Nicotine dependence, cigarettes, uncomplicated: Secondary | ICD-10-CM | POA: Insufficient documentation

## 2018-12-10 DIAGNOSIS — J45909 Unspecified asthma, uncomplicated: Secondary | ICD-10-CM | POA: Insufficient documentation

## 2018-12-10 DIAGNOSIS — R0789 Other chest pain: Secondary | ICD-10-CM | POA: Insufficient documentation

## 2018-12-10 LAB — CBC
HCT: 40.8 % (ref 39.0–52.0)
Hemoglobin: 13.1 g/dL (ref 13.0–17.0)
MCH: 29.6 pg (ref 26.0–34.0)
MCHC: 32.1 g/dL (ref 30.0–36.0)
MCV: 92.3 fL (ref 80.0–100.0)
Platelets: 249 10*3/uL (ref 150–400)
RBC: 4.42 MIL/uL (ref 4.22–5.81)
RDW: 13.6 % (ref 11.5–15.5)
WBC: 11.3 10*3/uL — ABNORMAL HIGH (ref 4.0–10.5)
nRBC: 0 % (ref 0.0–0.2)

## 2018-12-10 LAB — COMPREHENSIVE METABOLIC PANEL
ALT: 159 U/L — ABNORMAL HIGH (ref 0–44)
AST: 97 U/L — ABNORMAL HIGH (ref 15–41)
Albumin: 4.1 g/dL (ref 3.5–5.0)
Alkaline Phosphatase: 114 U/L (ref 38–126)
Anion gap: 12 (ref 5–15)
BUN: 17 mg/dL (ref 6–20)
CO2: 27 mmol/L (ref 22–32)
Calcium: 8.9 mg/dL (ref 8.9–10.3)
Chloride: 98 mmol/L (ref 98–111)
Creatinine, Ser: 1.02 mg/dL (ref 0.61–1.24)
GFR calc Af Amer: >60 mL/min (ref >60)
GFR calc non Af Amer: >60 mL/min (ref >60)
Glucose, Bld: 110 mg/dL — ABNORMAL HIGH (ref 70–99)
Potassium: 4.2 mmol/L (ref 3.5–5.1)
Sodium: 137 mmol/L (ref 135–145)
Total Bilirubin: 0.6 mg/dL (ref 0.3–1.2)
Total Protein: 7.6 g/dL (ref 6.5–8.1)

## 2018-12-10 LAB — TROPONIN I: Troponin I: <0.03 ng/mL (ref <0.03)

## 2018-12-10 LAB — FIBRIN DERIVATIVES D-DIMER (ARMC ONLY): Fibrin derivatives D-dimer (ARMC): 256.41 ng/mL (FEU) (ref 0.00–499.00)

## 2018-12-10 MED ORDER — OXYCODONE HCL 5 MG PO TABS: 5.0000 mg | ORAL_TABLET | Freq: Three times a day (TID) | ORAL | 0 refills | Status: DC | PRN

## 2018-12-10 MED ORDER — OXYCODONE HCL 5 MG PO TABS
5.0000 mg | ORAL_TABLET | Freq: Once | ORAL | Status: AC
  Administered 2018-12-10: 02:00:00 5 mg via ORAL
  Filled 2018-12-10: qty 1

## 2018-12-10 NOTE — ED Triage Notes (Signed)
Pt states central chest pain for three weeks. Pt denies dizziness, shob. Pt denies nausea, states does have bilateral arm pain radiation. Pt is a smoker.

## 2018-12-10 NOTE — ED Notes (Signed)
Pt transported to Xray at this time.

## 2018-12-10 NOTE — Discharge Instructions (Addendum)
Please call the number provided to follow-up with the breast center for further evaluation.  Return to the emergency department for any worsening chest pain, development of any shortness of breath, or any other symptom personally concerning to yourself.  [facility] in: [facility] Address: [address] [place], [place], [place] [address] Phone: [phone]

## 2018-12-10 NOTE — ED Provider Notes (Signed)
[facility] Emergency Department Provider Note  Time seen: 1:27 AM  I have reviewed the triage vital signs and the nursing notes.   HISTORY  Chief Complaint Chest Pain    HPI David Leach is a 43 y.o. male with a past medical history of asthma, prior DVT, chronic pain, presents to the emergency department for chest pain.  According to the patient for the past 3 weeks he has been experiencing pain in the left chest and feels a knot next to his sternum.  Patient denies any shortness of breath states he has had a dry cough, denies any fever.  Patient states that is more painful if you touch the area.  Denies any leg pain or swelling.   Past Medical History:  Diagnosis Date  . Asthma   . Chronic dental pain   . Chronic shoulder pain   . DVT (deep venous thrombosis) (HCC)   . Headache   . Pulmonary embolism (HCC)   . Right-sided back pain     Patient Active Problem List   Diagnosis Date Noted  . 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 Foot pain, bilateral 12/27/2016  . Pleuritic chest pain 12/27/2016  . Acute renal insufficiency 12/27/2016  . Pain of foot 12/27/2016  . Pulmonary embolism (HCC) 11/06/2016  . DVT (deep venous thrombosis) (HCC) 11/06/2016  . Asthma 11/06/2016    Past Surgical History:  Procedure Laterality Date  . TONSILLECTOMY      Prior to Admission medications   Medication Sig Start Date End Date Taking? Authorizing Provider  cyclobenzaprine (FLEXERIL) 10 MG tablet Take 1 tablet (10 mg total) by mouth 3 (three) times daily as needed for muscle spasms. 09/24/18   Emily FilbertWilliams, Jonathan E, MD  predniSONE (STERAPRED UNI-PAK 21 TAB) 10 MG (21) TBPK tablet dispense steroid taper pack as directed 09/24/18   Emily FilbertWilliams, Jonathan E, MD    Allergies  Allergen Reactions  . Hydrocodone Rash    Family History  Problem Relation Age of Onset  . Cancer Mother   . Diabetes Father   . Cancer Other   . Stroke Other   . Diabetes Other     Social  History Social History   Tobacco Use  . Smoking status: Current Every Day Smoker    Packs/day: 1.00    Years: 20.00    Pack years: 20.00    Types: Cigarettes  . Smokeless tobacco: Former NeurosurgeonUser    Types: Chew  Substance Use Topics  . Alcohol use: Yes    Comment: occ  . Drug use: No    Review of Systems Constitutional: Negative for fever. Cardiovascular: Positive for left-sided chest pain Respiratory: Negative for shortness of breath.  Mild dry cough. Gastrointestinal: Negative for abdominal pain, vomiting Musculoskeletal: Left chest wall pain Skin: Negative for skin complaints  Neurological: Negative for headache All other ROS negative  ____________________________________________   PHYSICAL EXAM:  VITAL SIGNS: ED Triage Vitals  Enc Vitals Group     BP 12/10/18 0038 138/74     Pulse Rate 12/10/18 0038 95     Resp 12/10/18 0038 18     Temp 12/10/18 0038 98.5 F (36.9 C)     Temp Source 12/10/18 0038 Oral     SpO2 12/10/18 0038 100 %     Weight 12/10/18 0035 185 lb (83.9 kg)     Height 12/10/18 0035 5\' 11"  (1.803 m)     Head Circumference --      Peak Flow --  Pain Score 12/10/18 0035 10     Pain Loc --      Pain Edu? --      Excl. in GC? --    Constitutional: Alert and oriented. Well appearing and in no distress. Eyes: Normal exam ENT      Head: Normocephalic and atraumatic.      Mouth/Throat: Mucous membranes are moist. Cardiovascular: Normal rate, regular rhythm. No murmur Respiratory: Normal respiratory effort without tachypnea nor retractions. Breath sounds are clear Gastrointestinal: Soft and nontender. No distention. Musculoskeletal: Nontender with normal range of motion in all extremities. No lower extremity tenderness or edema. Neurologic:  Normal speech and language. No gross focal neurologic deficits Skin:  Skin is warm, dry and intact.  Psychiatric: Mood and affect are normal.   ____________________________________________    EKG  EKG  viewed and interpreted by myself shows a normal sinus rhythm 89 bpm with a narrow QRS, normal axis, normal intervals nonspecific ST changes without ST elevation  ____________________________________________    RADIOLOGY  Chest x-ray is negative  ____________________________________________   INITIAL IMPRESSION / ASSESSMENT AND PLAN / ED COURSE  Pertinent labs & imaging results that were available during my care of the patient were reviewed by me and considered in my medical decision making (see chart for details).   Patient presents to the emergency department for left-sided chest pain and a knot to the left chest.  Patient states for the past 3 weeks he has been experiencing pain just to the left of his sternum where he can feel a small "knot."  Patient states is more tender when he pushes on the area.  No shortness of breath.  Patient does have a history of a prior PE.  Differential at this time would include chest wall pain, less likely ACS or PE, pneumonia or pneumothorax.  We will check labs, EKG and a chest x-ray.  Patient is EKG shows no concerning findings, lab work are largely at baseline including a negative troponin and negative d-dimer.  Chest x-ray on my review appears normal, awaiting official radiology read.  I did discuss with the patient given the small palpable area just left of the sternum he needs to follow-up with the breast center for an ultrasound in the near future.  Patient agreeable to plan of care.  We will discharge with a short course of pain medication for the patient.  David Leach was evaluated in Emergency Department on 12/10/2018 for the symptoms described in the history of present illness. He was evaluated in the context of the global COVID-19 pandemic, which necessitated consideration that the patient might be at risk for infection with the SARS-CoV-2 virus that causes COVID-19. Institutional protocols and algorithms that pertain to the evaluation of patients  at risk for COVID-19 are in a state of rapid change based on information released by regulatory bodies including the CDC and federal and state organizations. These policies and algorithms were followed during the patient's care in the ED.  ____________________________________________   FINAL CLINICAL IMPRESSION(S) / ED DIAGNOSES  Chest wall pain   Minna Antis, MD 12/10/18 0130

## 2018-12-10 NOTE — ED Triage Notes (Signed)
Pt states has a history of PE and DVT. Pt states he does feel shob, pt also states pain is worse with palpation to center of chest and pt appears to have swelling noted around sternum.

## 2018-12-20 ENCOUNTER — Other Ambulatory Visit: Payer: Self-pay | Admitting: Family Medicine

## 2018-12-20 DIAGNOSIS — N63 Unspecified lump in unspecified breast: Secondary | ICD-10-CM

## 2018-12-29 ENCOUNTER — Other Ambulatory Visit: Payer: Self-pay | Admitting: Family Medicine

## 2018-12-29 DIAGNOSIS — N63 Unspecified lump in unspecified breast: Secondary | ICD-10-CM

## 2019-01-03 ENCOUNTER — Other Ambulatory Visit: Payer: Self-pay

## 2019-01-03 ENCOUNTER — Ambulatory Visit: Admission: RE | Admit: 2019-01-03 | Payer: Self-pay | Source: Ambulatory Visit

## 2019-03-21 ENCOUNTER — Other Ambulatory Visit: Payer: Self-pay

## 2019-04-22 IMAGING — CT CT MAXILLOFACIAL W/ CM
3 of 4 series · 15 of 47 positions shown, 18 images · IV contrast (iopamidol)
Comparison: 04/15/2017 CT head.  01/31/2016 maxillofacial CT

CLINICAL DATA: 41 y/o  M; right facial swelling.

EXAM:
CT MAXILLOFACIAL WITH CONTRAST
TECHNIQUE: Multidetector CT imaging of the maxillofacial structures was
performed with intravenous contrast. Multiplanar CT image
reconstructions were also generated.
CONTRAST:  75mL [id number] IOPAMIDOL ([id number]-UVV) INJECTION 61%

[Series 2: max soft · axial · 0.36mm/px · z∈[+316,+472]mm · 10 of 92 slices shown, 13 images]
[im 7/92  brain]
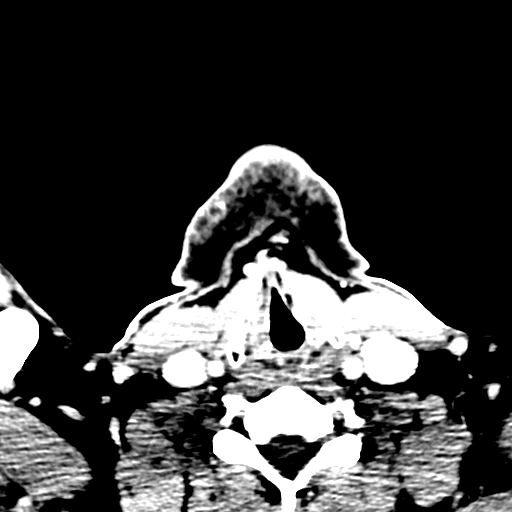
[im 7/92  bone]
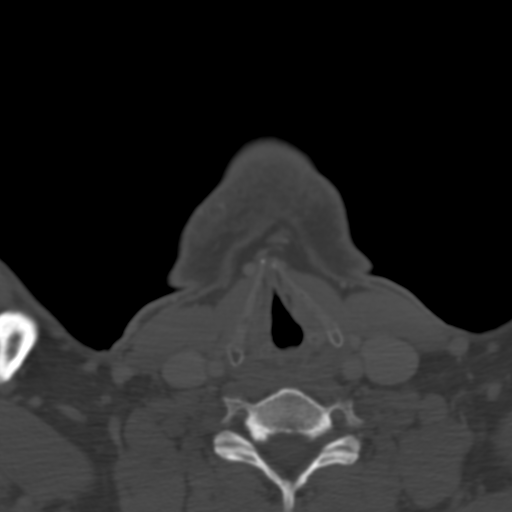
[im 16/92  bone]
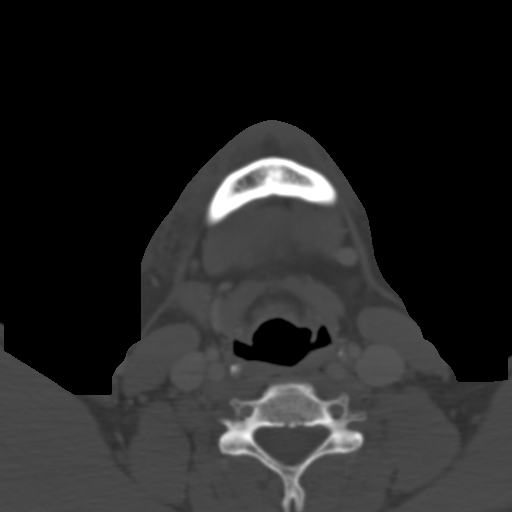
[im 26/92  bone]
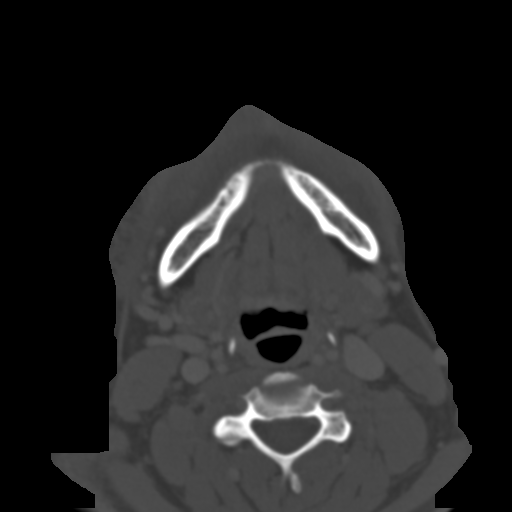
[im 32/92  bone]
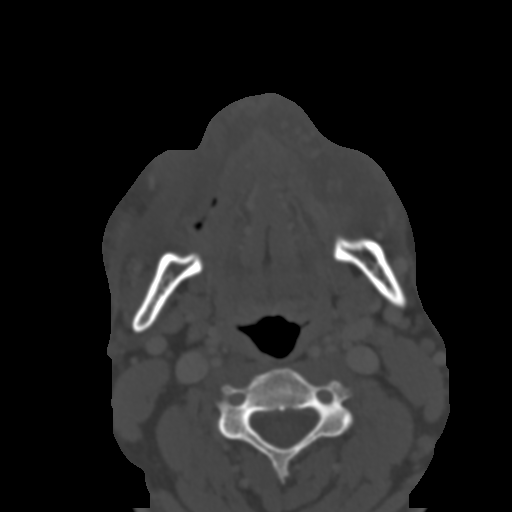
[im 41/92  brain]
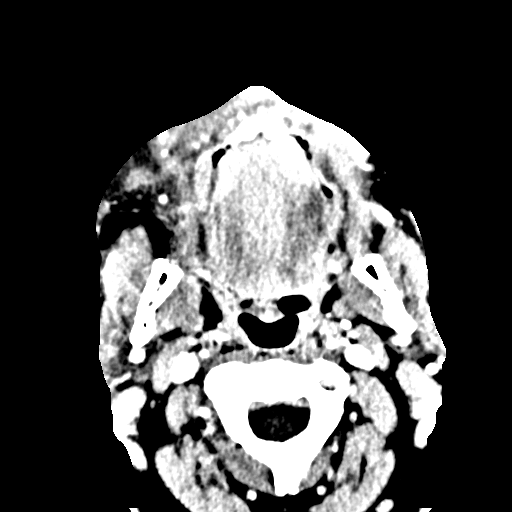
[im 41/92  bone]
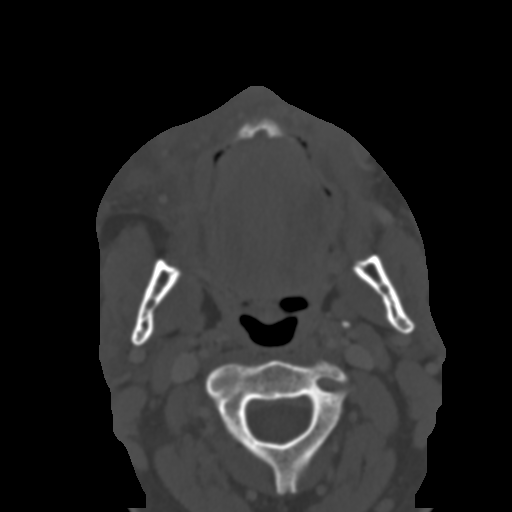
[im 51/92  bone]
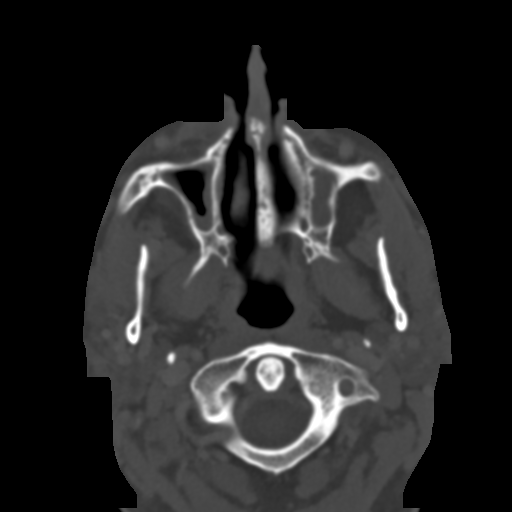
[im 60/92  bone]
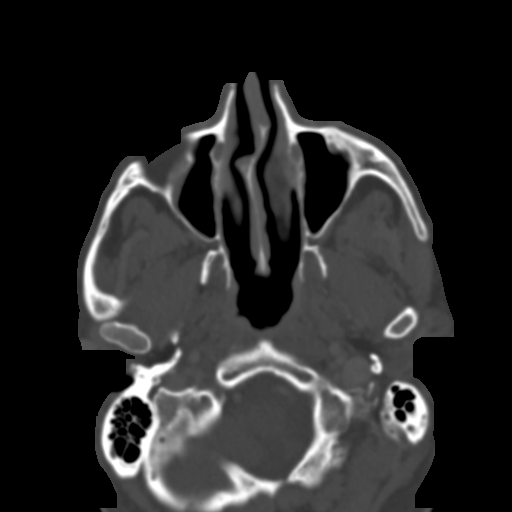
[im 70/92  bone]
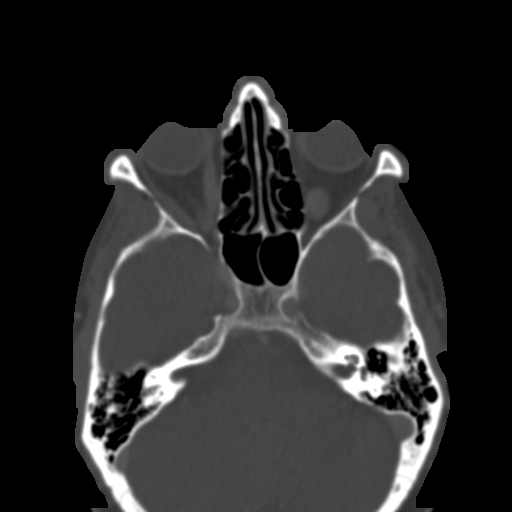
[im 76/92  brain]
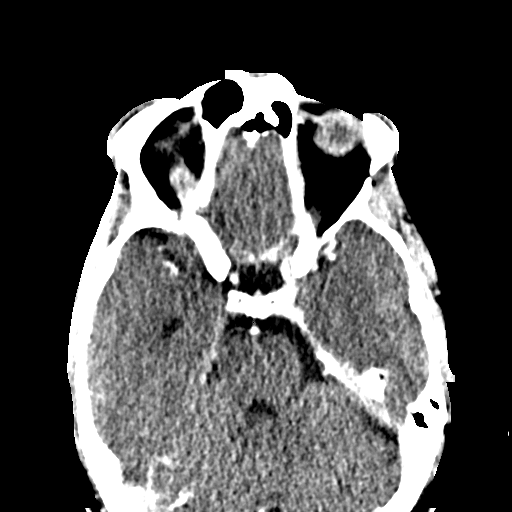
[im 76/92  bone]
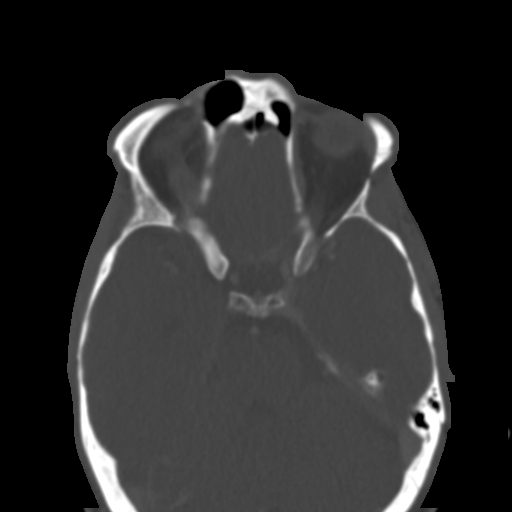
[im 85/92  bone]
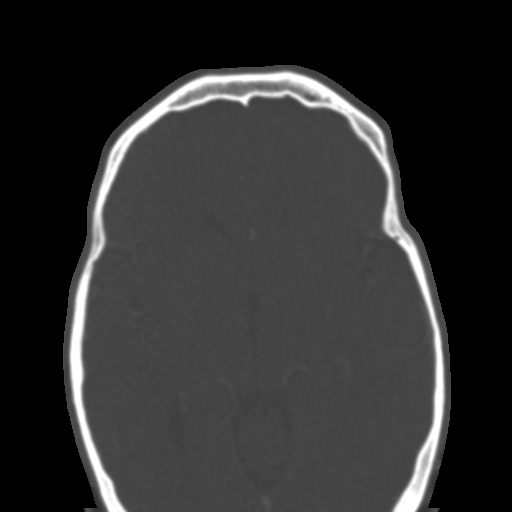

[Series 4: coronal soft · coronal · 0.34mm/px · 3 of 86 slices shown]
[im 29/86  bone]
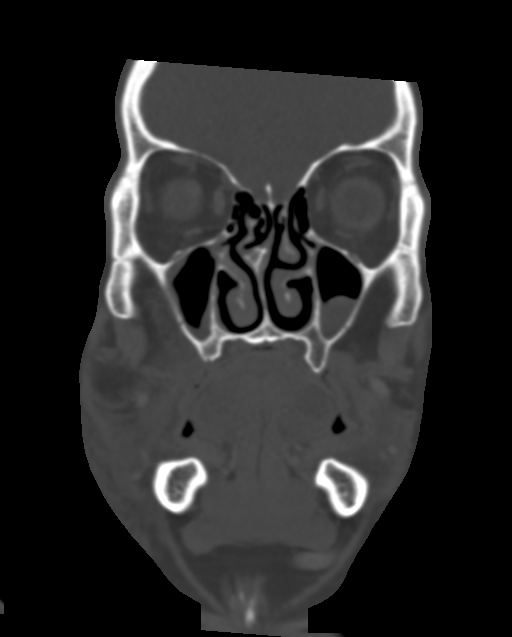
[im 38/86  bone]
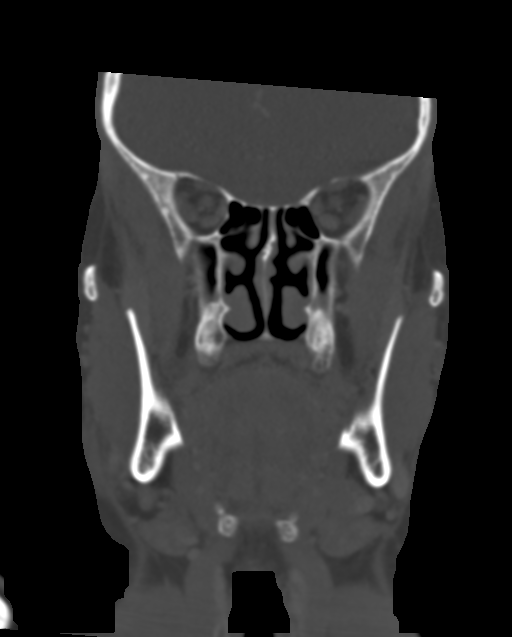
[im 48/86  bone]
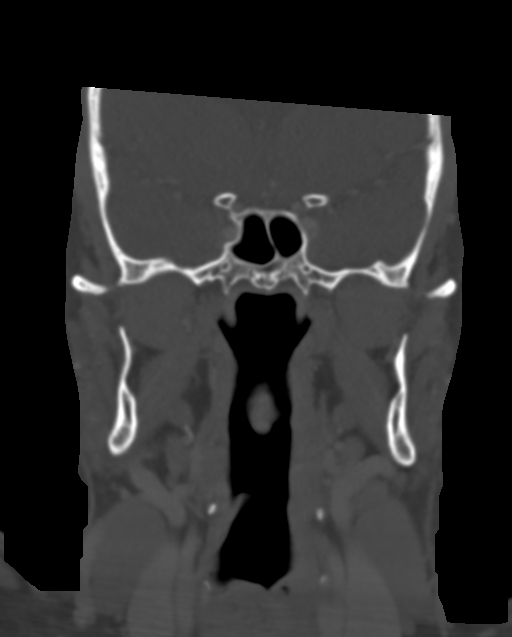

[Series 7: sagittal bone · sagittal · 0.41mm/px · 2 of 76 slices shown]
[im 26/76  bone]
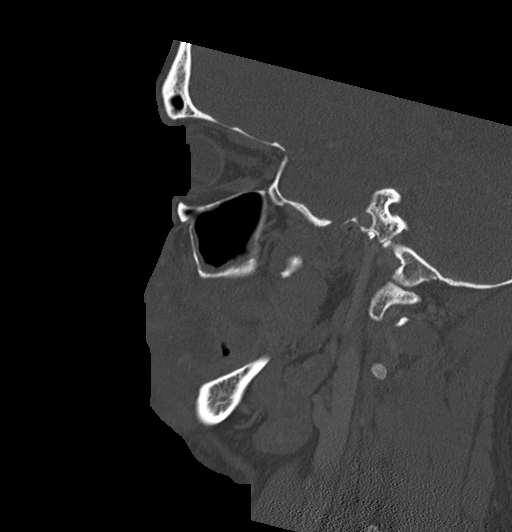
[im 51/76  bone]
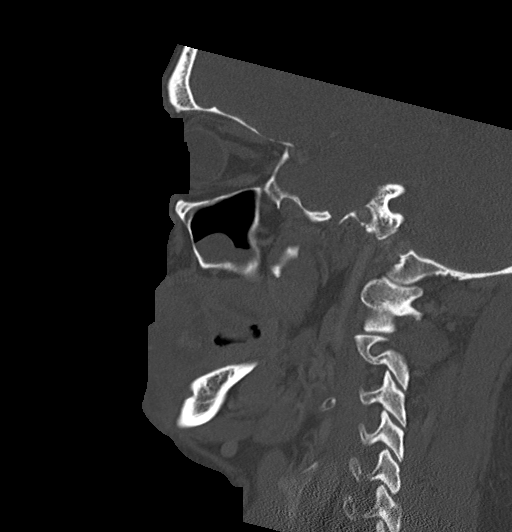

[15 of 47 positions shown; findings below may reference images not displayed]

FINDINGS: Osseous: No fracture or mandibular dislocation. No destructive
process.

Orbits: Negative. No traumatic or inflammatory finding.

Sinuses: Mild maxillary sinus mucosal thickening and small left
maxillary sinus mucous retention cyst.

Soft tissues: Extensive edema involving right face and
right-greater-than-left mandibular superficial soft tissues. The no
discrete fluid collection. No inflammation of deep cervical
compartments or the aerodigestive tract.

Limited intracranial: No significant or unexpected finding.
IMPRESSION: Extensive edema involving right face and right greater than left
mandibular superficial soft tissues. No fluid collection. No
inflammation of deep cervical compartments or aerodigestive tract.
Findings may represent cellulitis or allergic/drug reaction.

By: Nurba Kambarov M.D.

## 2019-07-05 ENCOUNTER — Inpatient Hospital Stay
Admission: EM | Admit: 2019-07-05 | Discharge: 2019-07-09 | DRG: 603 | Disposition: A | Discharge status: Home / Self Care | Payer: Self-pay | Attending: Internal Medicine | Admitting: Internal Medicine

## 2019-07-05 ENCOUNTER — Encounter: Payer: Self-pay | Admitting: Emergency Medicine

## 2019-07-05 ENCOUNTER — Emergency Department: Payer: Self-pay

## 2019-07-05 ENCOUNTER — Other Ambulatory Visit: Payer: Self-pay

## 2019-07-05 DIAGNOSIS — L03115 Cellulitis of right lower limb: Principal | ICD-10-CM | POA: Diagnosis present

## 2019-07-05 DIAGNOSIS — Z79899 Other long term (current) drug therapy: Secondary | ICD-10-CM

## 2019-07-05 DIAGNOSIS — Z20828 Contact with and (suspected) exposure to other viral communicable diseases: Secondary | ICD-10-CM | POA: Diagnosis present

## 2019-07-05 DIAGNOSIS — Z86718 Personal history of other venous thrombosis and embolism: Secondary | ICD-10-CM

## 2019-07-05 DIAGNOSIS — Z86711 Personal history of pulmonary embolism: Secondary | ICD-10-CM

## 2019-07-05 DIAGNOSIS — Z720 Tobacco use: Secondary | ICD-10-CM | POA: Diagnosis present

## 2019-07-05 DIAGNOSIS — F17210 Nicotine dependence, cigarettes, uncomplicated: Secondary | ICD-10-CM | POA: Diagnosis present

## 2019-07-05 DIAGNOSIS — J45909 Unspecified asthma, uncomplicated: Secondary | ICD-10-CM | POA: Diagnosis present

## 2019-07-05 LAB — CBC WITH DIFFERENTIAL/PLATELET
Abs Immature Granulocytes: 0.03 10*3/uL (ref 0.00–0.07)
Basophils Absolute: 0.1 10*3/uL (ref 0.0–0.1)
Basophils Relative: 1 %
Eosinophils Absolute: 0.3 10*3/uL (ref 0.0–0.5)
Eosinophils Relative: 2 %
HCT: 38.8 % — ABNORMAL LOW (ref 39.0–52.0)
Hemoglobin: 12.5 g/dL — ABNORMAL LOW (ref 13.0–17.0)
Immature Granulocytes: 0 %
Lymphocytes Relative: 26 %
Lymphs Abs: 3.5 10*3/uL (ref 0.7–4.0)
MCH: 29.1 pg (ref 26.0–34.0)
MCHC: 32.2 g/dL (ref 30.0–36.0)
MCV: 90.4 fL (ref 80.0–100.0)
Monocytes Absolute: 1.4 10*3/uL — ABNORMAL HIGH (ref 0.1–1.0)
Monocytes Relative: 10 %
Neutro Abs: 8.4 10*3/uL — ABNORMAL HIGH (ref 1.7–7.7)
Neutrophils Relative %: 61 %
Platelets: 284 10*3/uL (ref 150–400)
RBC: 4.29 MIL/uL (ref 4.22–5.81)
RDW: 14.3 % (ref 11.5–15.5)
WBC: 13.7 10*3/uL — ABNORMAL HIGH (ref 4.0–10.5)
nRBC: 0 % (ref 0.0–0.2)

## 2019-07-05 LAB — COMPREHENSIVE METABOLIC PANEL
ALT: 93 U/L — ABNORMAL HIGH (ref 0–44)
AST: 74 U/L — ABNORMAL HIGH (ref 15–41)
Albumin: 3.9 g/dL (ref 3.5–5.0)
Alkaline Phosphatase: 104 U/L (ref 38–126)
Anion gap: 9 (ref 5–15)
BUN: 22 mg/dL — ABNORMAL HIGH (ref 6–20)
CO2: 25 mmol/L (ref 22–32)
Calcium: 9.2 mg/dL (ref 8.9–10.3)
Chloride: 103 mmol/L (ref 98–111)
Creatinine, Ser: 1.39 mg/dL — ABNORMAL HIGH (ref 0.61–1.24)
GFR calc Af Amer: >60 mL/min (ref >60)
GFR calc non Af Amer: >60 mL/min (ref >60)
Glucose, Bld: 97 mg/dL (ref 70–99)
Potassium: 4.5 mmol/L (ref 3.5–5.1)
Sodium: 137 mmol/L (ref 135–145)
Total Bilirubin: 0.5 mg/dL (ref 0.3–1.2)
Total Protein: 7.6 g/dL (ref 6.5–8.1)

## 2019-07-05 LAB — LACTIC ACID, PLASMA: Lactic Acid, Venous: 1 mmol/L (ref 0.5–1.9)

## 2019-07-05 NOTE — ED Triage Notes (Signed)
Pt to triage via w/c with no distress noted, mask in place; pt reports pain/swelling to rt knee; denies any known injury; approx nickle size ulcer noted with redness extending up into thigh

## 2019-07-06 DIAGNOSIS — Z720 Tobacco use: Secondary | ICD-10-CM | POA: Diagnosis present

## 2019-07-06 DIAGNOSIS — J4520 Mild intermittent asthma, uncomplicated: Secondary | ICD-10-CM

## 2019-07-06 DIAGNOSIS — R079 Chest pain, unspecified: Secondary | ICD-10-CM

## 2019-07-06 DIAGNOSIS — L03115 Cellulitis of right lower limb: Principal | ICD-10-CM | POA: Diagnosis present

## 2019-07-06 LAB — COMPREHENSIVE METABOLIC PANEL
ALT: 82 U/L — ABNORMAL HIGH (ref 0–44)
AST: 71 U/L — ABNORMAL HIGH (ref 15–41)
Albumin: 3.5 g/dL (ref 3.5–5.0)
Alkaline Phosphatase: 88 U/L (ref 38–126)
Anion gap: 11 (ref 5–15)
BUN: 17 mg/dL (ref 6–20)
CO2: 23 mmol/L (ref 22–32)
Calcium: 8.6 mg/dL — ABNORMAL LOW (ref 8.9–10.3)
Chloride: 105 mmol/L (ref 98–111)
Creatinine, Ser: 0.91 mg/dL (ref 0.61–1.24)
GFR calc Af Amer: >60 mL/min (ref >60)
GFR calc non Af Amer: >60 mL/min (ref >60)
Glucose, Bld: 106 mg/dL — ABNORMAL HIGH (ref 70–99)
Potassium: 4.7 mmol/L (ref 3.5–5.1)
Sodium: 139 mmol/L (ref 135–145)
Total Bilirubin: 0.7 mg/dL (ref 0.3–1.2)
Total Protein: 6.9 g/dL (ref 6.5–8.1)

## 2019-07-06 LAB — CBC
HCT: 38.2 % — ABNORMAL LOW (ref 39.0–52.0)
Hemoglobin: 12.2 g/dL — ABNORMAL LOW (ref 13.0–17.0)
MCH: 29.5 pg (ref 26.0–34.0)
MCHC: 31.9 g/dL (ref 30.0–36.0)
MCV: 92.5 fL (ref 80.0–100.0)
Platelets: 252 10*3/uL (ref 150–400)
RBC: 4.13 MIL/uL — ABNORMAL LOW (ref 4.22–5.81)
RDW: 14.5 % (ref 11.5–15.5)
WBC: 13.9 10*3/uL — ABNORMAL HIGH (ref 4.0–10.5)
nRBC: 0 % (ref 0.0–0.2)

## 2019-07-06 LAB — FIBRIN DERIVATIVES D-DIMER (ARMC ONLY): Fibrin derivatives D-dimer (ARMC): 365.43 ng/mL (FEU) (ref 0.00–499.00)

## 2019-07-06 LAB — HIV ANTIBODY (ROUTINE TESTING W REFLEX): HIV Screen 4th Generation wRfx: NONREACTIVE

## 2019-07-06 MED ORDER — VANCOMYCIN HCL IN DEXTROSE 1-5 GM/200ML-% IV SOLN: 1000.0000 mg | Freq: Once | INTRAVENOUS | Status: DC

## 2019-07-06 MED ORDER — ONDANSETRON HCL 4 MG/2ML IJ SOLN: 4.0000 mg | Freq: Four times a day (QID) | INTRAMUSCULAR | Status: DC | PRN

## 2019-07-06 MED ORDER — VANCOMYCIN HCL IN DEXTROSE 1-5 GM/200ML-% IV SOLN: 1000.0000 mg | Freq: Two times a day (BID) | INTRAVENOUS | Status: DC

## 2019-07-06 MED ORDER — ENOXAPARIN SODIUM 40 MG/0.4ML ~~LOC~~ SOLN
40.0000 mg | SUBCUTANEOUS | Status: DC
  Administered 2019-07-06 – 2019-07-09 (×4): 40 mg via SUBCUTANEOUS
  Filled 2019-07-06 (×6): qty 0.4

## 2019-07-06 MED ORDER — SODIUM CHLORIDE 0.9 % IV SOLN
1.0000 g | INTRAVENOUS | Status: DC
  Administered 2019-07-06 – 2019-07-08 (×3): 1 g via INTRAVENOUS
  Filled 2019-07-06: qty 10
  Filled 2019-07-06 (×3): qty 1

## 2019-07-06 MED ORDER — SODIUM CHLORIDE 0.9 % IV SOLN
1.0000 g | Freq: Once | INTRAVENOUS | Status: AC
  Administered 2019-07-06: 1 g via INTRAVENOUS
  Filled 2019-07-06: qty 10

## 2019-07-06 MED ORDER — SODIUM CHLORIDE 0.9 % IV SOLN
INTRAVENOUS | Status: DC
  Administered 2019-07-06 – 2019-07-09 (×7): via INTRAVENOUS

## 2019-07-06 MED ORDER — ONDANSETRON HCL 4 MG PO TABS
4.0000 mg | ORAL_TABLET | Freq: Four times a day (QID) | ORAL | Status: DC | PRN
  Filled 2019-07-06: qty 1

## 2019-07-06 MED ORDER — OXYCODONE-ACETAMINOPHEN 5-325 MG PO TABS
1.0000 | ORAL_TABLET | ORAL | Status: DC | PRN
  Administered 2019-07-07 – 2019-07-09 (×11): 2 via ORAL
  Filled 2019-07-06 (×12): qty 2

## 2019-07-06 MED ORDER — PIPERACILLIN-TAZOBACTAM 3.375 G IVPB
3.3750 g | Freq: Three times a day (TID) | INTRAVENOUS | Status: DC
  Administered 2019-07-06: 3.375 g via INTRAVENOUS
  Filled 2019-07-06 (×4): qty 50

## 2019-07-06 MED ORDER — VANCOMYCIN HCL IN DEXTROSE 1-5 GM/200ML-% IV SOLN
1000.0000 mg | INTRAVENOUS | Status: AC
  Administered 2019-07-06 (×2): 1000 mg via INTRAVENOUS
  Filled 2019-07-06 (×2): qty 200

## 2019-07-06 MED ORDER — TRAZODONE HCL 50 MG PO TABS
25.0000 mg | ORAL_TABLET | Freq: Every evening | ORAL | Status: DC | PRN
  Filled 2019-07-06: qty 0.5

## 2019-07-06 MED ORDER — ONDANSETRON HCL 4 MG/2ML IJ SOLN
4.0000 mg | Freq: Once | INTRAMUSCULAR | Status: AC
  Administered 2019-07-06: 4 mg via INTRAVENOUS
  Filled 2019-07-06: qty 2

## 2019-07-06 MED ORDER — MORPHINE SULFATE (PF) 4 MG/ML IV SOLN
4.0000 mg | Freq: Once | INTRAVENOUS | Status: AC
  Administered 2019-07-06: 4 mg via INTRAVENOUS
  Filled 2019-07-06: qty 1

## 2019-07-06 MED ORDER — ACETAMINOPHEN 325 MG PO TABS: 650.0000 mg | ORAL_TABLET | Freq: Four times a day (QID) | ORAL | Status: DC | PRN

## 2019-07-06 MED ORDER — VANCOMYCIN HCL 10 G IV SOLR: 1250.0000 mg | Freq: Two times a day (BID) | INTRAVENOUS | Status: DC

## 2019-07-06 MED ORDER — MAGNESIUM HYDROXIDE 400 MG/5ML PO SUSP: 30.0000 mL | Freq: Every day | ORAL | Status: DC | PRN

## 2019-07-06 MED ORDER — VANCOMYCIN HCL 10 G IV SOLR
2000.0000 mg | Freq: Once | INTRAVENOUS | Status: DC
  Filled 2019-07-06: qty 2000

## 2019-07-06 MED ORDER — MORPHINE SULFATE (PF) 2 MG/ML IV SOLN
2.0000 mg | INTRAVENOUS | Status: DC | PRN
  Administered 2019-07-06 – 2019-07-08 (×14): 2 mg via INTRAVENOUS
  Filled 2019-07-06 (×15): qty 1

## 2019-07-06 MED ORDER — PIPERACILLIN-TAZOBACTAM 3.375 G IVPB 30 MIN: 3.3750 g | Freq: Four times a day (QID) | INTRAVENOUS | Status: DC

## 2019-07-06 MED ORDER — ACETAMINOPHEN 650 MG RE SUPP
650.0000 mg | Freq: Four times a day (QID) | RECTAL | Status: DC | PRN
  Filled 2019-07-06: qty 1

## 2019-07-06 NOTE — ED Notes (Signed)
Pt refusing to leave BP and pulse ox on.

## 2019-07-06 NOTE — Progress Notes (Signed)
Pharmacy Antibiotic Note  David Leach is a 43 y.o. male admitted on 07/05/2019 with cellulitis. Patient presented to [facility] ED c/o worsening right knee pain, swelling, and erythema in presence of wound of unknown origin. Pharmacy has been consulted for vancomycin dosing. He is also on pip/tazo. Patient received vancomycin 2 g LD in the ED.   Serum creatinine has improved from 1.39 on admission to 0.91 today. WBC   Plan: Increase vancomycin to 1250 mg IV q 12 hrs. Goal AUC 400-550. Expected AUC: 439.6 Cssmin: 11.1 SCr used: 0.91  Daily Scr per protocol. Levels in 2-3 days if patient remains on therapy. Monitor for nephrotoxicity with pip/tazo and vancomycin combination.   Height: 5\' 11"  (180.3 cm) Weight: 178 lb (80.7 kg) IBW/kg (Calculated) : 75.3  Temp (24hrs), Avg:98.6 F (37 C), Min:98.5 F (36.9 C), Max:98.6 F (37 C)  Recent Labs  Lab 07/05/19 1923 07/06/19 0446  WBC 13.7* 13.9*  CREATININE 1.39* 0.91  LATICACIDVEN 1.0  --     Estimated Creatinine Clearance: 112.6 mL/min (by C-G formula based on SCr of 0.91 mg/dL).    Allergies  Allergen Reactions  . Hydrocodone Rash   Antimicrobials this admission: Ceftriaxone x 1 Pip/tazo 11/6 >>  Vancomycin 11/6 >>   Dose adjustments this admission: 11/6: vancomycin increased from 1000 mg q12h to 1250 mg q12h based on improved creatinine clearance  Microbiology results: 11/5 BCx: pending  Thank you for allowing pharmacy to be a part of this patient's care.  [place] Resident 07/06/2019 10:04 AM

## 2019-07-06 NOTE — Progress Notes (Signed)
PROGRESS NOTE  LENARDO WESTWOOD ERX:[id number] DOB: 24-Mar-1976 DOA: 07/05/2019 PCP: Patient, No Pcp Per  HPI/Recap of past 4 hours: 43 year old male with past medical history of asthma, PE and ongoing tobacco abuse presented to the emergency room on the night of 11/5 with complaints of right knee pain and swelling that have been going on for several days.  He had a small wound on his right knee which she was not sure how it had gotten there in over the past few days, it has gotten worse with severe swelling and erythema.  The redness had gone up to his right thigh close to his groin.  He did note a fever.  In the emergency room, lab work noted a white count of 13.7, with a BUN of 22 and creatinine of 1.39.  Lactic acid level normal.  Patient was given IV antibiotics and fluids.  Lower extremity ultrasound negative for DVT.  Blood cultures drawn and are pending.  Hospitalist were called for admission.  This morning, patient's still having a significant amount of knee pain, especially when he moves it, but he says that he is feeling a little bit better.  Assessment/Plan: Active Problems:   Cellulitis of right knee: Patient received IV Rocephin, vancomycin and Zosyn in the emergency room, the latter two being continued.  He appears to be stable and no evidence of sepsis.  Appears to be nonpurulent, so stop vancomycin and Zosyn and restart Rocephin.  Have outlined the borders with a sharpie to follow improvement in progress.  No evidence of joint involvement at this time.    Tobacco abuse: Counseled  History of asthma: Stable  Code Status: Full code  Family Communication: Left message for mom  Disposition Plan: Home in a few days, once swelling down and patient able to ambulate without too much limitation   Consultants:  None  Procedures:  Lower extremity Doppler, negative for DVT  Antimicrobials:  IV Rocephin 11/6-present  IV vancomycin and Zosyn 11/6 only  DVT  prophylaxis: Lovenox   Objective: Vitals:   07/06/19 0832 07/06/19 1240  BP: 120/90   Pulse: 76 61  Resp: 18 16  Temp: 98.5 F (36.9 C)   SpO2: 97% 96%    Intake/Output Summary (Last 24 hours) at 07/06/2019 1321 Last data filed at 07/06/2019 1238 Gross per 24 hour  Intake 400.12 ml  Output 200 ml  Net 200.12 ml   Filed Weights   07/05/19 1920  Weight: 80.7 kg   Body mass index is 24.83 kg/m.  Exam:   General: Alert oriented x3, mild distress secondary to pain  HEENT: Normocephalic and atraumatic, mucous membranes are moist  Cardiovascular: Regular rate and rhythm, S1-S2  Respiratory: Clear to auscultation bilaterally  Abdomen: Soft, nontender, nondistended, positive bowel sounds  Musculoskeletal: No clubbing or cyanosis, right lower extremity has large swelling at right knee with a small half centimeter open wound, nondraining with surrounding erythema involving most right kneecap minimal extension to the foot, but on the medial aspect of his right thigh, extends upward  Skin: Erythema as described above  Psychiatry: Appropriate, no evidence of psychoses   Data Reviewed: CBC: Recent Labs  Lab 07/05/19 1923 07/06/19 0446  WBC 13.7* 13.9*  NEUTROABS 8.4*  --   HGB 12.5* 12.2*  HCT 38.8* 38.2*  MCV 90.4 92.5  PLT 284 252   Basic Metabolic Panel: Recent Labs  Lab 07/05/19 1923 07/06/19 0446  NA 137 139  K 4.5 4.7  CL 103 105  CO2 25 23  GLUCOSE 97 106*  BUN 22* 17  CREATININE 1.39* 0.91  CALCIUM 9.2 8.6*   GFR: Estimated Creatinine Clearance: 112.6 mL/min (by C-G formula based on SCr of 0.91 mg/dL). Liver Function Tests: Recent Labs  Lab 07/05/19 1923 07/06/19 0446  AST 74* 71*  ALT 93* 82*  ALKPHOS 104 88  BILITOT 0.5 0.7  PROT 7.6 6.9  ALBUMIN 3.9 3.5   No results for input(s): LIPASE, AMYLASE in the last 168 hours. No results for input(s): AMMONIA in the last 168 hours. Coagulation Profile: No results for input(s): INR, PROTIME  in the last 168 hours. Cardiac Enzymes: No results for input(s): CKTOTAL, CKMB, CKMBINDEX, TROPONINI in the last 168 hours. BNP (last 3 results) No results for input(s): PROBNP in the last 8760 hours. HbA1C: No results for input(s): HGBA1C in the last 72 hours. CBG: No results for input(s): GLUCAP in the last 168 hours. Lipid Profile: No results for input(s): CHOL, HDL, LDLCALC, TRIG, CHOLHDL, LDLDIRECT in the last 72 hours. Thyroid Function Tests: No results for input(s): TSH, T4TOTAL, FREET4, T3FREE, THYROIDAB in the last 72 hours. Anemia Panel: No results for input(s): VITAMINB12, FOLATE, FERRITIN, TIBC, IRON, RETICCTPCT in the last 72 hours. Urine analysis:    Component Value Date/Time   COLORURINE YELLOW (A) 02/28/2017 1915   APPEARANCEUR CLEAR (A) 02/28/2017 1915   LABSPEC 1.017 02/28/2017 1915   PHURINE 6.0 02/28/2017 1915   GLUCOSEU NEGATIVE 02/28/2017 1915   HGBUR NEGATIVE 02/28/2017 1915   BILIRUBINUR NEGATIVE 02/28/2017 1915   KETONESUR NEGATIVE 02/28/2017 1915   PROTEINUR NEGATIVE 02/28/2017 1915   UROBILINOGEN 0.2 11/10/2014 1735   NITRITE NEGATIVE 02/28/2017 1915   LEUKOCYTESUR NEGATIVE 02/28/2017 1915   Sepsis Labs: @LABRCNTIP (procalcitonin:4,lacticidven:4)  )No results found for this or any previous visit (from the past 240 hour(s)).    Studies: [place] Venous Img Lower Unilateral Right  Result Date: 07/05/2019 CLINICAL DATA:  Right leg swelling. EXAM: RIGHT LOWER EXTREMITY VENOUS DOPPLER ULTRASOUND TECHNIQUE: Gray-scale sonography with graded compression, as well as color Doppler and duplex ultrasound were performed to evaluate the lower extremity deep venous systems from the level of the common femoral vein and including the common femoral, femoral, profunda femoral, popliteal and calf veins including the posterior tibial, peroneal and gastrocnemius veins when visible. The superficial great saphenous vein was also interrogated. Spectral Doppler was utilized to  evaluate flow at rest and with distal augmentation maneuvers in the common femoral, femoral and popliteal veins. COMPARISON:  Bilateral lower extremity venous doppler ultrasound dated December 27, 2016. FINDINGS: Contralateral Common Femoral Vein: Respiratory phasicity is normal and symmetric with the symptomatic side. No evidence of thrombus. Normal compressibility. Common Femoral Vein: No evidence of thrombus. Normal compressibility, respiratory phasicity and response to augmentation. Saphenofemoral Junction: No evidence of thrombus. Normal compressibility and flow on color Doppler imaging. Profunda Femoral Vein: No evidence of thrombus. Normal compressibility and flow on color Doppler imaging. Femoral Vein: No evidence of thrombus. Normal compressibility, respiratory phasicity and response to augmentation. Popliteal Vein: No evidence of thrombus. Normal compressibility, respiratory phasicity and response to augmentation. Calf Veins: No evidence of thrombus. Normal compressibility and flow on color Doppler imaging. Superficial Great Saphenous Vein: No evidence of thrombus. Normal compressibility. Venous Reflux:  None. Other Findings: Prominent soft tissue swelling around the right knee. IMPRESSION: 1. No evidence of deep venous thrombosis. 2. Prominent soft tissue swelling around the right knee. Electronically Signed   By: Obie DredgeWilliam T Derry M.D.   On: 07/05/2019 20:55  Scheduled Meds:  enoxaparin (LOVENOX) injection  40 mg Subcutaneous Q24H    Continuous Infusions:  sodium chloride 100 mL/hr at 07/06/19 0323   piperacillin-tazobactam (ZOSYN)  IV Stopped (07/06/19 1048)   vancomycin       LOS: 0 days     Annita Brod, MD [facility]  To reach me or the doctor on call, go to: [email or web address] Password [email or web address]  07/06/2019, 1:21 PM

## 2019-07-06 NOTE — ED Notes (Signed)
Pt requesting pain meds. Pt has finished his peanut butter crackers and ginger ale at this time. VSS. Pharmacy called to verify meds so narcotics and antibiotics could be given.

## 2019-07-06 NOTE — Progress Notes (Signed)
Pharmacy Antibiotic Note  David Leach is a 43 y.o. male admitted on 07/05/2019 with cellulitis.  Pharmacy has been consulted for vancomycin dosing.  Plan: Patient is currently received vanc 1g IV x 2 for a total of 2g IV load  Vancomycin 1000 mg IV Q 12 hrs. Goal AUC 400-550. Expected AUC: 524.9 SCr used: 1.39 Cssmin: 15.4  Will continue to monitor renal function as patient is in AKI w/ BMP coming back w/ am labs to reassess renal function.  Height: 5\' 11"  (180.3 cm) Weight: 178 lb (80.7 kg) IBW/kg (Calculated) : 75.3  Temp (24hrs), Avg:98.6 F (37 C), Min:98.6 F (37 C), Max:98.6 F (37 C)  Recent Labs  Lab 07/05/19 1923  WBC 13.7*  CREATININE 1.39*  LATICACIDVEN 1.0    Estimated Creatinine Clearance: 73.7 mL/min (A) (by C-G formula based on SCr of 1.39 mg/dL (H)).    Allergies  Allergen Reactions  . Hydrocodone Rash   Thank you for allowing pharmacy to be a part of this patient's care.  Tobie Lords, PharmD, BCPS Clinical Pharmacist 07/06/2019 4:24 AM

## 2019-07-06 NOTE — H&P (Addendum)
[name] [facility]   PATIENT NAME: David Leach    MR#:  [id number]  DATE OF BIRTH:  [birth year]  DATE OF ADMISSION: 07/06/2019  PRIMARY CARE PHYSICIAN: Patient, No Pcp Per   REQUESTING/REFERRING PHYSICIAN: Bayard Males, MD CHIEF COMPLAINT:   Chief Complaint  Patient presents with  . Cellulitis    HISTORY OF PRESENT ILLNESS:  David Leach  is a 43 y.o. Caucasian male with a known history of asthma, ongoing tobacco abuse and PE, presented to the emergency room acute onset of worsening right knee pain with swelling and erythema after having a small wound that he does not recall a source for.  He stated that it has been extending to his right medial thigh into the groin.  He denies any chest pain except with severe right knee pain and denied dyspnea cough and wheezing.  He admitted to tactile fever without chills.  No headache or dizziness or blurred vision.  Upon presentation to the emergency room, vital signs were within normal.  Labs revealed leukocytosis of 13.7 with neutrophilia and mild anemia, elevated BUN at 22 and creatinine 1.39 as well as elevated AST and ALT.  The patient had right lower extremity venous duplex that came back negative for DVT.  It showed prominent soft tissue swelling around the knee.  Blood cultures were obtained and the patient was given IV Rocephin in the ER.  He will be admitted to a medical bed for further evaluation and management. PAST MEDICAL HISTORY:   Past Medical History:  Diagnosis Date  . Asthma   . Chronic dental pain   . Chronic shoulder pain   . DVT (deep venous thrombosis) (HCC)   . Headache   . Pulmonary embolism (HCC)   . Right-sided back pain   Ongoing tobacco abuse  PAST SURGICAL HISTORY:   Past Surgical History:  Procedure Laterality Date  . TONSILLECTOMY      SOCIAL HISTORY:   Social History   Tobacco Use  . Smoking status: Current Every Day Smoker    Packs/day: 1.00    Years: 20.00    Pack  years: 20.00    Types: Cigarettes  . Smokeless tobacco: Former Neurosurgeon    Types: Chew  Substance Use Topics  . Alcohol use: Yes    Comment: occ    FAMILY HISTORY:   Family History  Problem Relation Age of Onset  . Cancer Mother   . Diabetes Father   . Cancer Other   . Stroke Other   . Diabetes Other     DRUG ALLERGIES:   Allergies  Allergen Reactions  . Hydrocodone Rash    REVIEW OF SYSTEMS:   ROS As per history of present illness. All pertinent systems were reviewed above. Constitutional,  HEENT, cardiovascular, respiratory, GI, GU, musculoskeletal, neuro, psychiatric, endocrine,  integumentary and hematologic systems were reviewed and are otherwise  negative/unremarkable except for positive findings mentioned above in the HPI.   MEDICATIONS AT HOME:   Prior to Admission medications   Not on File      VITAL SIGNS:  Blood pressure 117/61, pulse 74, temperature 98.6 F (37 C), temperature source Oral, resp. rate 18, height 5\' 11"  (1.803 m), weight 80.7 kg, SpO2 98 %.  PHYSICAL EXAMINATION:  Physical Exam  GENERAL:  43 y.o.-year-old Caucasian male patient lying in the bed with mild distress from right knee pain. EYES: Pupils equal, round, reactive to light and accommodation. No scleral icterus. Extraocular muscles intact.  HEENT: Head atraumatic,  normocephalic. Oropharynx and nasopharynx clear.  NECK:  Supple, no jugular venous distention. No thyroid enlargement, no tenderness.  LUNGS: Normal breath sounds bilaterally, no wheezing, rales,rhonchi or crepitation. No use of accessory muscles of respiration.  CARDIOVASCULAR: Regular rate and rhythm, S1, S2 normal. No murmurs, rubs, or gallops.  ABDOMEN: Soft, nondistended, nontender. Bowel sounds present. No organomegaly or mass.  EXTREMITIES: No pedal edema, cyanosis, or clubbing.  NEUROLOGIC: Cranial nerves II through XII are intact. Muscle strength 5/5 in all extremities. Sensation intact. Gait not checked.   PSYCHIATRIC: The patient is alert and oriented x 3.  Normal affect and good eye contact. SKIN: Right knee soft tissue swelling with erythema, induration tenderness especially over his patella with decreased range of motion secondary to pain LABORATORY PANEL:   CBC Recent Labs  Lab 07/06/19 0446  WBC 13.9*  HGB 12.2*  HCT 38.2*  PLT 252   ------------------------------------------------------------------------------------------------------------------  Chemistries  Recent Labs  Lab 07/06/19 0446  NA 139  K 4.7  CL 105  CO2 23  GLUCOSE 106*  BUN 17  CREATININE 0.91  CALCIUM 8.6*  AST 71*  ALT 82*  ALKPHOS 88  BILITOT 0.7   ------------------------------------------------------------------------------------------------------------------  Cardiac Enzymes No results for input(s): TROPONINI in the last 168 hours. ------------------------------------------------------------------------------------------------------------------  RADIOLOGY:  US Venous Img Lower Unilateral Right  Result Date: 07/05/2019 CLINICAL DATA:  Right leg swelling. EXAM: RIGHT LOWER EXTREMITY VENOUS DOPPLER ULTRASOUND TECHNIQUE: Gray-scale sonography with graded compression, as well as color Doppler and duplex ultrasound were performed to evaluate the lower extremity deep venous systems from the level of the common femoral vein and including the common femoral, femoral, profunda femoral, popliteal and calf veins including the posterior tibial, peroneal and gastrocnemius veins when visible. The superficial great saphenous vein was also interrogated. Spectral Doppler was utilized to evaluate flow at rest and with distal augmentation maneuvers in the common femoral, femoral and popliteal veins. COMPARISON:  Bilateral lower extremity venous doppler ultrasound dated December 27, 2016. FINDINGS: Contralateral Common Femoral Vein: Respiratory phasicity is normal and symmetric with the symptomatic side. No evidence of  thrombus. Normal compressibility. Common Femoral Vein: No evidence of thrombus. Normal compressibility, respiratory phasicity and response to augmentation. Saphenofemoral Junction: No evidence of thrombus. Normal compressibility and flow on color Doppler imaging. Profunda Femoral Vein: No evidence of thrombus. Normal compressibility and flow on color Doppler imaging. Femoral Vein: No evidence of thrombus. Normal compressibility, respiratory phasicity and response to augmentation. Popliteal Vein: No evidence of thrombus. Normal compressibility, respiratory phasicity and response to augmentation. Calf Veins: No evidence of thrombus. Normal compressibility and flow on color Doppler imaging. Superficial Great Saphenous Vein: No evidence of thrombus. Normal compressibility. Venous Reflux:  None. Other Findings: Prominent soft tissue swelling around the right knee. IMPRESSION: 1. No evidence of deep venous thrombosis. 2. Prominent soft tissue swelling around the right knee. Electronically Signed   By: Titus Dubin M.D.   On: 07/05/2019 20:55      IMPRESSION AND PLAN:   1.  Right knee cellulitis mildly extending to his left medial thigh.  The patient will be admitted to medical bed.  He will be continued on IV antibiotic therapy with vancomycin and Zosyn.  Pain management will be provided.  Wound Gram stain, culture and sensitivity will be obtained.  2.  Chest pain.  Though this is likely atypical, given his history of PE and low current suspicion, will obtain a D-dimer level.  He does not chest pain unless he has right knee pain.Marland Kitchen  3.  Asthma.  No current exacerbation.  He will be placed on as needed duo nebs.  4.  Ongoing tobacco abuse.  I counseled him for smoking cessation and he will receive further counseling here.  5.  DVT prophylaxis.  Subcutaneous Lovenox   All the records are reviewed and case discussed with ED provider. The plan of care was discussed in details with the patient (and  family). I answered all questions. The patient agreed to proceed with the above mentioned plan. Further management will depend upon hospital course.   CODE STATUS: Full code  TOTAL TIME TAKING CARE OF THIS PATIENT: 50 minutes.    Hannah BeatJan A  M.D on 07/06/2019 at 6:40 AM  [facility]   From 7 PM-7 AM, contact night-coverage [email or web address]  CC: Primary care physician; Patient, No Pcp Per   Note: This dictation was prepared with Dragon dictation along with smaller phrase technology. Any transcriptional errors that result from this process are unintentional.

## 2019-07-06 NOTE — ED Provider Notes (Addendum)
[facility] Emergency Department Provider Note   First MD Initiated Contact with Patient 07/06/19 0002     (approximate)  I have reviewed the triage vital signs and the nursing notes.   HISTORY  Chief Complaint Cellulitis   HPI David Leach is a 43 y.o. male with below list of previous medical conditions presents to the emergency department secondary to 3-day history of progressive right knee pain swelling and redness that the patient states is now extended up to his groin.  Patient denies any fever afebrile on presentation.  Patient states her current pain score is 10 out of 10.  Patient states pain is worsened with any movement of the knee.        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 Authorizing Provider  cyclobenzaprine (FLEXERIL) 10 MG tablet Take 1 tablet (10 mg total) by mouth 3 (three) times daily as needed for muscle spasms. 09/24/18   Emily FilbertWilliams, Jonathan E, MD  oxyCODONE (ROXICODONE) 5 MG immediate release tablet Take 1 tablet (5 mg total) by mouth every 8 (eight) hours as needed. 12/10/18 12/10/19  Minna AntisPaduchowski, Kevin, MD  predniSONE (STERAPRED UNI-PAK 21 TAB) 10 MG (21) TBPK tablet dispense steroid taper pack as directed 09/24/18   Emily FilbertWilliams, Jonathan E, MD     Allergies Hydrocodone  Family History  Problem Relation Age of Onset   Cancer Mother    Diabetes Father    Cancer Other    Stroke Other    Diabetes Other     Social History Social History   Tobacco Use   Smoking status: Current Every Day Smoker    Packs/day: 1.00    Years: 20.00    Pack years: 20.00    Types: Cigarettes   Smokeless tobacco: Former NeurosurgeonUser    Types: Chew  Substance Use Topics   Alcohol use: Yes    Comment: occ   Drug use: No    Review of Systems Constitutional: No fever/chills Eyes: No visual changes. ENT: No sore throat. Cardiovascular: Denies chest pain. Respiratory: Denies shortness of breath. Gastrointestinal: No abdominal pain.  No nausea, no vomiting.  No diarrhea.  No constipation. Genitourinary: Negative for dysuria. Musculoskeletal: Negative for neck pain.  Negative for back pain. Integumentary: Negative for rash. Neurological: Negative for headaches, focal weakness or numbness.  ____________________________________________   PHYSICAL EXAM:  VITAL SIGNS: ED Triage Vitals  Enc Vitals Group     BP 07/05/19 1915 116/68     Pulse Rate 07/05/19 1915 88     Resp 07/05/19 1915 18     Temp 07/05/19 1915 98.6 F (37 C)     Temp Source 07/05/19 1915 Oral     SpO2 07/05/19 1915 100 %  Weight 07/05/19 1920 80.7 kg (178 lb)     Height 07/05/19 1920 1.803 m (5\' 11" )     Head Circumference --      Peak Flow --      Pain Score 07/05/19 1920 10     Pain Loc --      Pain Edu? --      Excl. in GC? --     Constitutional: Alert and oriented.  Eyes: Conjunctivae are normal.  Mouth/Throat: Patient is wearing a mask. Neck: No stridor.  No meningeal signs.   Cardiovascular: Normal rate, regular rhythm. Good peripheral circulation. Grossly normal heart sounds. Respiratory: Normal respiratory effort.  No retractions. Gastrointestinal: Soft and nontender. No distention.  Musculoskeletal: Blanching erythema of the right knee with  central pustule extending to the proximal thigh Neurologic:  Normal speech and language. No gross focal neurologic deficits are appreciated.  Skin: Blanching erythema of the right knee with a central pustule consistent with cellulitis extending onto the medial thigh to the proximal thigh. Psychiatric: Mood and affect are normal. Speech and behavior are normal.  ____________________________________________   LABS (all labs ordered are listed, but only abnormal results are displayed)  Labs Reviewed  CBC WITH DIFFERENTIAL/PLATELET - Abnormal; Notable for the following components:      Result Value   WBC 13.7 (*)    Hemoglobin 12.5 (*)    HCT 38.8 (*)    Neutro Abs 8.4 (*)    Monocytes Absolute 1.4 (*)    All other components within normal limits  COMPREHENSIVE METABOLIC PANEL - Abnormal; Notable for the following components:   BUN 22 (*)    Creatinine, Ser 1.39 (*)    AST 74 (*)    ALT 93 (*)    All other components within normal limits  CULTURE, BLOOD (ROUTINE X 2)  CULTURE, BLOOD (ROUTINE X 2)  LACTIC ACID, PLASMA     RADIOLOGY I, Paden N Layne Lebon, personally viewed and evaluated these images (plain radiographs) as part of my medical decision making, as well as reviewing the written report by the radiologist.  ED MD interpretation: Ultrasound revealed no evidence of DVT prominent soft tissue swelling around the knee.  Official radiology report(s): 13/05/20 Venous Img Lower Unilateral Right  Result Date: 07/05/2019 CLINICAL DATA:  Right leg swelling. EXAM: RIGHT LOWER EXTREMITY VENOUS DOPPLER ULTRASOUND TECHNIQUE: Gray-scale sonography with graded compression, as well as color Doppler and duplex ultrasound were performed to evaluate the lower extremity deep venous systems from the level of the common femoral vein and including the common femoral, femoral, profunda femoral, popliteal and calf veins including the posterior tibial, peroneal and gastrocnemius veins when visible. The  superficial great saphenous vein was also interrogated. Spectral Doppler was utilized to evaluate flow at rest and with distal augmentation maneuvers in the common femoral, femoral and popliteal veins. COMPARISON:  Bilateral lower extremity venous doppler ultrasound dated December 27, 2016. FINDINGS: Contralateral Common Femoral Vein: Respiratory phasicity is normal and symmetric with the symptomatic side. No evidence of thrombus. Normal compressibility. Common Femoral Vein: No evidence of thrombus. Normal compressibility, respiratory phasicity and response to augmentation. Saphenofemoral Junction: No evidence of thrombus. Normal compressibility and flow on color Doppler imaging. Profunda Femoral Vein: No evidence of thrombus. Normal compressibility and flow on color Doppler imaging. Femoral Vein: No evidence of thrombus. Normal compressibility, respiratory phasicity and response to augmentation. Popliteal Vein: No evidence of thrombus. Normal compressibility, respiratory phasicity and response to augmentation. Calf Veins: No evidence of thrombus. Normal compressibility and  flow on color Doppler imaging. Superficial Great Saphenous Vein: No evidence of thrombus. Normal compressibility. Venous Reflux:  None. Other Findings: Prominent soft tissue swelling around the right knee. IMPRESSION: 1. No evidence of deep venous thrombosis. 2. Prominent soft tissue swelling around the right knee. Electronically Signed   By: Titus Dubin M.D.   On: 07/05/2019 20:55      Procedures   ____________________________________________   INITIAL IMPRESSION / MDM / ASSESSMENT AND PLAN / ED COURSE  As part of my medical decision making, I reviewed the following data within the electronic MEDICAL RECORD NUMBER   43 year old male presenting with above-stated history and physical exam consistent with cellulitis of the right knee extending to the groin.  Patient given IV ceftriaxone 1 g.  Patient given morphine 4 mg with improvement  of discomfort.  Patient discussed with Dr. Sidney Ace for hospital admission for further evaluation and management.  ____________________________________________  FINAL CLINICAL IMPRESSION(S) / ED DIAGNOSES  Final diagnoses:  Cellulitis of right knee     MEDICATIONS GIVEN DURING THIS VISIT:  Medications  morphine 4 MG/ML injection 4 mg (has no administration in time range)  ondansetron (ZOFRAN) injection 4 mg (has no administration in time range)  cefTRIAXone (ROCEPHIN) 1 g in sodium chloride 0.9 % 100 mL IVPB (has no administration in time range)     ED Discharge Orders    None      *Please note:  ASIAH BEFORT was evaluated in Emergency Department on 07/06/2019 for the symptoms described in the history of present illness. He was evaluated in the context of the global COVID-19 pandemic, which necessitated consideration that the patient might be at risk for infection with the SARS-CoV-2 virus that causes COVID-19. Institutional protocols and algorithms that pertain to the evaluation of patients at risk for COVID-19 are in a state of rapid change based on information released by regulatory bodies including the CDC and federal and state organizations. These policies and algorithms were followed during the patient's care in the ED.  Some ED evaluations and interventions may be delayed as a result of limited staffing during the pandemic.*  Note:  This document was prepared using Dragon voice recognition software and may include unintentional dictation errors.   Gregor Hams, MD 07/06/19 [id number]    Gregor Hams, MD 07/06/19 [phone]

## 2019-07-07 DIAGNOSIS — J45909 Unspecified asthma, uncomplicated: Secondary | ICD-10-CM

## 2019-07-07 DIAGNOSIS — Z720 Tobacco use: Secondary | ICD-10-CM

## 2019-07-07 LAB — BASIC METABOLIC PANEL
Anion gap: 8 (ref 5–15)
BUN: 16 mg/dL (ref 6–20)
CO2: 24 mmol/L (ref 22–32)
Calcium: 7.9 mg/dL — ABNORMAL LOW (ref 8.9–10.3)
Chloride: 104 mmol/L (ref 98–111)
Creatinine, Ser: 0.82 mg/dL (ref 0.61–1.24)
GFR calc Af Amer: >60 mL/min (ref >60)
GFR calc non Af Amer: >60 mL/min (ref >60)
Glucose, Bld: 111 mg/dL — ABNORMAL HIGH (ref 70–99)
Potassium: 4.3 mmol/L (ref 3.5–5.1)
Sodium: 136 mmol/L (ref 135–145)

## 2019-07-07 LAB — CBC
HCT: 35.3 % — ABNORMAL LOW (ref 39.0–52.0)
Hemoglobin: 11.5 g/dL — ABNORMAL LOW (ref 13.0–17.0)
MCH: 29.7 pg (ref 26.0–34.0)
MCHC: 32.6 g/dL (ref 30.0–36.0)
MCV: 91.2 fL (ref 80.0–100.0)
Platelets: 253 10*3/uL (ref 150–400)
RBC: 3.87 MIL/uL — ABNORMAL LOW (ref 4.22–5.81)
RDW: 14.5 % (ref 11.5–15.5)
WBC: 10.9 10*3/uL — ABNORMAL HIGH (ref 4.0–10.5)
nRBC: 0 % (ref 0.0–0.2)

## 2019-07-07 LAB — SARS CORONAVIRUS 2 (TAT 6-24 HRS): SARS Coronavirus 2: NEGATIVE

## 2019-07-07 NOTE — Plan of Care (Signed)
  Problem: Education: Goal: Knowledge of General Education information will improve Description Including pain rating scale, medication(s)/side effects and non-pharmacologic comfort measures Outcome: Progressing   

## 2019-07-07 NOTE — Progress Notes (Signed)
PROGRESS NOTE  David Leach MWN:[id number] DOB: Jul 04, 1976 DOA: 07/05/2019 PCP: Patient, No Pcp Per  HPI/Recap of past 29 hours: 43 year old male with past medical history of asthma, PE and ongoing tobacco abuse presented to the emergency room on the night of 11/5 with complaints of right knee pain and swelling that have been going on for several days.  He had a small wound on his right knee which she was not sure how it had gotten there in over the past few days, it has gotten worse with severe swelling and erythema.  The redness had gone up to his right thigh close to his groin.  He did note a fever.  In the emergency room, lab work noted a white count of 13.7, with a BUN of 22 and creatinine of 1.39.  Lactic acid level normal.  Patient was given IV antibiotics and fluids.  Lower extremity ultrasound negative for DVT.  Blood cultures drawn and are pending.  Hospitalist were called for admission.  Patient states he is feeling better somewhat.  Still significant pain and limited mobility, but decreased from previous day.  Assessment/Plan: Active Problems:   Cellulitis of right knee: Patient received IV Rocephin, vancomycin and Zosyn in the emergency room, the latter two being continued.  He appears to be stable and no evidence of sepsis.  Appears to be nonpurulent, and from previous day,Patient's knee continues to improve, although still a significant of erythema and decreased mobility.  Cellulitis areas look to be receding and now much more localized to the anterior aspect of the knee.  No active drainage.  Tobacco abuse: Counseled  History of asthma: Stable  Code Status: Full code  Family Communication: Left message for mother  Disposition Plan: Home potentially tomorrow or the next day   Consultants:  None  Procedures:  Lower extremity Doppler, negative for DVT  Antimicrobials:  IV Rocephin 11/6-present  IV vancomycin and Zosyn 11/6 only  DVT prophylaxis: Lovenox    Objective: Vitals:   07/06/19 2212 07/07/19 0359  BP: 109/64 (!) 99/55  Pulse: 84 63  Resp: 20 16  Temp: 99.2 F (37.3 C) 98.7 F (37.1 C)  SpO2: 98% 99%    Intake/Output Summary (Last 24 hours) at 07/07/2019 1430 Last data filed at 07/07/2019 1420 Gross per 24 hour  Intake 1576.91 ml  Output 1200 ml  Net 376.91 ml   Filed Weights   07/05/19 1920 07/06/19 2212  Weight: 80.7 kg 76.9 kg   Body mass index is 23.65 kg/m.  Exam:   General: Alert oriented x3, mild distress secondary to pain  HEENT: Normocephalic and atraumatic, mucous membranes are moist  Cardiovascular: Regular rate and rhythm, S1-S2  Respiratory: Clear to auscultation bilaterally  Abdomen: Soft, nontender, nondistended, positive bowel sounds  Musculoskeletal: No clubbing or cyanosis, right lower extremity has moderate swelling at right knee with a small half centimeter wound that is now scabbed over, nondraining with surrounding erythema involving anterior aspect of the right knee and the cellulitis involving his right thigh and down to his foot has receded  Skin: Erythema as described above  Psychiatry: Appropriate, no evidence of psychoses   Data Reviewed: CBC: Recent Labs  Lab 07/05/19 1923 07/06/19 0446 07/07/19 0506  WBC 13.7* 13.9* 10.9*  NEUTROABS 8.4*  --   --   HGB 12.5* 12.2* 11.5*  HCT 38.8* 38.2* 35.3*  MCV 90.4 92.5 91.2  PLT 284 252 253   Basic Metabolic Panel: Recent Labs  Lab 07/05/19 1923 07/06/19 0446  07/07/19 0506  NA 137 139 136  K 4.5 4.7 4.3  CL 103 105 104  CO2 25 23 24   GLUCOSE 97 106* 111*  BUN 22* 17 16  CREATININE 1.39* 0.91 0.82  CALCIUM 9.2 8.6* 7.9*   GFR: Estimated Creatinine Clearance: 125 mL/min (by C-G formula based on SCr of 0.82 mg/dL). Liver Function Tests: Recent Labs  Lab 07/05/19 1923 07/06/19 0446  AST 74* 71*  ALT 93* 82*  ALKPHOS 104 88  BILITOT 0.5 0.7  PROT 7.6 6.9  ALBUMIN 3.9 3.5   No results for input(s): LIPASE,  AMYLASE in the last 168 hours. No results for input(s): AMMONIA in the last 168 hours. Coagulation Profile: No results for input(s): INR, PROTIME in the last 168 hours. Cardiac Enzymes: No results for input(s): CKTOTAL, CKMB, CKMBINDEX, TROPONINI in the last 168 hours. BNP (last 3 results) No results for input(s): PROBNP in the last 8760 hours. HbA1C: No results for input(s): HGBA1C in the last 72 hours. CBG: No results for input(s): GLUCAP in the last 168 hours. Lipid Profile: No results for input(s): CHOL, HDL, LDLCALC, TRIG, CHOLHDL, LDLDIRECT in the last 72 hours. Thyroid Function Tests: No results for input(s): TSH, T4TOTAL, FREET4, T3FREE, THYROIDAB in the last 72 hours. Anemia Panel: No results for input(s): VITAMINB12, FOLATE, FERRITIN, TIBC, IRON, RETICCTPCT in the last 72 hours. Urine analysis:    Component Value Date/Time   COLORURINE YELLOW (A) 02/28/2017 1915   APPEARANCEUR CLEAR (A) 02/28/2017 1915   LABSPEC 1.017 02/28/2017 1915   PHURINE 6.0 02/28/2017 1915   GLUCOSEU NEGATIVE 02/28/2017 1915   HGBUR NEGATIVE 02/28/2017 1915   BILIRUBINUR NEGATIVE 02/28/2017 1915   KETONESUR NEGATIVE 02/28/2017 1915   PROTEINUR NEGATIVE 02/28/2017 1915   UROBILINOGEN 0.2 11/10/2014 1735   NITRITE NEGATIVE 02/28/2017 1915   LEUKOCYTESUR NEGATIVE 02/28/2017 1915   Sepsis Labs: @LABRCNTIP (procalcitonin:4,lacticidven:4)  ) Recent Results (from the past 240 hour(s))  SARS CORONAVIRUS 2 (TAT 6-24 HRS) Nasopharyngeal Nasopharyngeal Swab     Status: None   Collection Time: 07/06/19 10:40 PM   Specimen: Nasopharyngeal Swab  Result Value Ref Range Status   SARS Coronavirus 2 NEGATIVE NEGATIVE Final    Comment: (NOTE) SARS-CoV-2 target nucleic acids are NOT DETECTED. The SARS-CoV-2 RNA is generally detectable in upper and lower respiratory specimens during the acute phase of infection. Negative results do not preclude SARS-CoV-2 infection, do not rule out co-infections with other  pathogens, and should not be used as the sole basis for treatment or other patient management decisions. Negative results must be combined with clinical observations, patient history, and epidemiological information. The expected result is Negative. Fact Sheet for Patients: SugarRoll.be Fact Sheet for Healthcare Providers: https://www.woods-mathews.com/ This test is not yet approved or cleared by the [place] FDA and  has been authorized for detection and/or diagnosis of SARS-CoV-2 by FDA under an Emergency Use Authorization (EUA). This EUA will remain  in effect (meaning this test can be used) for the duration of the COVID-19 declaration under Section 56 4(b)(1) of the Act, 21 U.S.C. section 360bbb-3(b)(1), unless the authorization is terminated or revoked sooner. Performed at [facility], [place] [address], [place], [place] [address]       Studies: No results found.  Scheduled Meds: . enoxaparin (LOVENOX) injection  40 mg Subcutaneous Q24H    Continuous Infusions: . sodium chloride 100 mL/hr at 07/07/19 1116  . cefTRIAXone (ROCEPHIN)  IV Stopped (07/06/19 2352)     LOS: 1 day  Hollice EspySendil K Roch Quach, MD [facility]  To reach me or the doctor on call, go to: [email or web address] Password [email or web address]  07/07/2019, 2:30 PM

## 2019-07-08 LAB — CBC
HCT: 34.4 % — ABNORMAL LOW (ref 39.0–52.0)
Hemoglobin: 11.5 g/dL — ABNORMAL LOW (ref 13.0–17.0)
MCH: 29.8 pg (ref 26.0–34.0)
MCHC: 33.4 g/dL (ref 30.0–36.0)
MCV: 89.1 fL (ref 80.0–100.0)
Platelets: 244 10*3/uL (ref 150–400)
RBC: 3.86 MIL/uL — ABNORMAL LOW (ref 4.22–5.81)
RDW: 14.1 % (ref 11.5–15.5)
WBC: 9.6 10*3/uL (ref 4.0–10.5)
nRBC: 0 % (ref 0.0–0.2)

## 2019-07-08 LAB — BASIC METABOLIC PANEL
Anion gap: 6 (ref 5–15)
BUN: 14 mg/dL (ref 6–20)
CO2: 27 mmol/L (ref 22–32)
Calcium: 8.2 mg/dL — ABNORMAL LOW (ref 8.9–10.3)
Chloride: 108 mmol/L (ref 98–111)
Creatinine, Ser: 0.74 mg/dL (ref 0.61–1.24)
GFR calc Af Amer: >60 mL/min (ref >60)
GFR calc non Af Amer: >60 mL/min (ref >60)
Glucose, Bld: 105 mg/dL — ABNORMAL HIGH (ref 70–99)
Potassium: 4.3 mmol/L (ref 3.5–5.1)
Sodium: 141 mmol/L (ref 135–145)

## 2019-07-08 LAB — PROCALCITONIN: Procalcitonin: <0.1 ng/mL

## 2019-07-08 MED ORDER — MORPHINE SULFATE (PF) 2 MG/ML IV SOLN
2.0000 mg | Freq: Once | INTRAVENOUS | Status: AC
  Administered 2019-07-08: 2 mg via INTRAVENOUS
  Filled 2019-07-08: qty 1

## 2019-07-08 MED ORDER — DOXYCYCLINE HYCLATE 100 MG PO TBEC: 100.0000 mg | DELAYED_RELEASE_TABLET | Freq: Two times a day (BID) | ORAL | 0 refills | Status: DC

## 2019-07-08 NOTE — Consult Note (Signed)
SURGICAL CONSULTATION NOTE   HISTORY OF PRESENT ILLNESS (HPI):  43 y.o. male presented to [facility] ED for evaluation of right knee cellulitis with fluid collection. Patient reports having right knee pain and swelling since few days ago.  The patient reported that in the last 3 to 4 days his right knee started to get swollen and a lot of redness going up from his knee up to his proximal thigh.  He endorses significant tenderness.  The pain does not radiate to other part of the body.  Aggravating factor is applying pressure.  Alleviating factor has been pain medications and antibiotic therapy.  He denies fever or chills.  The patient reports that he "popped" the collection today and has been draining all over the place.  He also reported that the redness has significantly decreased.  It was up to his proximal thigh and now is concentrated on the knee.  Surgery is consulted by Dr. Rito Ehrlich in this context for evaluation and management of right knee cellulitis with fluid collection.  PAST MEDICAL HISTORY (PMH):  Past Medical History:  Diagnosis Date  . Asthma   . Chronic dental pain   . Chronic shoulder pain   . DVT (deep venous thrombosis) (HCC)   . Headache   . Pulmonary embolism (HCC)   . Right-sided back pain      PAST SURGICAL HISTORY (PSH):  Past Surgical History:  Procedure Laterality Date  . TONSILLECTOMY       MEDICATIONS:  Prior to Admission medications   Not on File     ALLERGIES:  Allergies  Allergen Reactions  . Hydrocodone Rash     SOCIAL HISTORY:  Social History   Socioeconomic History  . Marital status: Legally Separated    Spouse name: Not on file  . Number of children: Not on file  . Years of education: Not on file  . Highest education level: Not on file  Occupational History  . Not on file  Social Needs  . Financial resource strain: Not on file  . Food insecurity    Worry: Not on file    Inability: Not on file  . Transportation needs    Medical: Not  on file    Non-medical: Not on file  Tobacco Use  . Smoking status: Current Every Day Smoker    Packs/day: 1.00    Years: 20.00    Pack years: 20.00    Types: Cigarettes  . Smokeless tobacco: Former Neurosurgeon    Types: Chew  Substance and Sexual Activity  . Alcohol use: Yes    Comment: occ  . Drug use: No  . Sexual activity: Yes  Lifestyle  . Physical activity    Days per week: Not on file    Minutes per session: Not on file  . Stress: Not on file  Relationships  . Social Musician on phone: Not on file    Gets together: Not on file    Attends religious service: Not on file    Active member of club or organization: Not on file    Attends meetings of clubs or organizations: Not on file    Relationship status: Not on file  . Intimate partner violence    Fear of current or ex partner: Not on file    Emotionally abused: Not on file    Physically abused: Not on file    Forced sexual activity: Not on file  Other Topics Concern  . Not on file  Social  History Narrative  . Not on file    The patient currently resides (home / rehab facility / nursing home): Home The patient normally is (ambulatory / bedbound): Ambulatory   FAMILY HISTORY:  Family History  Problem Relation Age of Onset  . Cancer Mother   . Diabetes Father   . Cancer Other   . Stroke Other   . Diabetes Other      REVIEW OF SYSTEMS:  Constitutional: denies weight loss, fever, chills, or sweats  Eyes: denies any other vision changes, history of eye injury  ENT: denies sore throat, hearing problems  Respiratory: denies shortness of breath, wheezing  Cardiovascular: denies chest pain, palpitations  Gastrointestinal: denies abdominal pain, N/V, or diarrhea Genitourinary: denies burning with urination or urinary frequency Musculoskeletal: Positive for right knee joint pains  Skin: denies any other rashes or skin discolorations.  Positive for right knee redness Neurological: denies any other headache,  dizziness, weakness  Psychiatric: denies any other depression, anxiety   All other review of systems were negative   VITAL SIGNS:  Temp:  [98.3 F (36.8 C)-99.3 F (37.4 C)] 98.4 F (36.9 C) (11/08 1242) Pulse Rate:  [67-85] 67 (11/08 1242) Resp:  [18-20] 18 (11/08 1242) BP: (101-128)/(55-72) 128/72 (11/08 1242) SpO2:  [97 %-100 %] 100 % (11/08 1242)     Height: 5\' 11"  (180.3 cm) Weight: 76.9 kg BMI (Calculated): 23.66   PHYSICAL EXAM:  Constitutional:  -- Normal body habitus  -- Awake, alert, and oriented x3  Eyes:  -- Pupils equally round and reactive to light  -- No scleral icterus  Ear, nose, and throat:  -- No jugular venous distension  Pulmonary:  -- No crackles  -- Equal breath sounds bilaterally -- Breathing non-labored at rest Cardiovascular:  -- S1, S2 present  -- No pericardial rubs Gastrointestinal:  -- Abdomen soft, nontender, non-distended, no guarding or rebound tenderness -- No abdominal masses appreciated, pulsatile or otherwise  Musculoskeletal and Integumentary:  -- Wounds or skin discoloration: Right knee small fluid collection, draining, with elevated cellulitis.  Warm to touch, tender to palpation -- Extremities: B/L UE and LE FROM, hands and feet warm, no pitting edema  Neurologic:  -- Motor function: intact and symmetric -- Sensation: intact and symmetric   Labs:  CBC Latest Ref Rng & Units 07/08/2019 07/07/2019 07/06/2019  WBC 4.0 - 10.5 K/uL 9.6 10.9(H) 13.9(H)  Hemoglobin 13.0 - 17.0 g/dL 11.5(L) 11.5(L) 12.2(L)  Hematocrit 39.0 - 52.0 % 34.4(L) 35.3(L) 38.2(L)  Platelets 150 - 400 K/uL 244 253 252   CMP Latest Ref Rng & Units 07/08/2019 07/07/2019 07/06/2019  Glucose 70 - 99 mg/dL 098(J105(H) 191(Y111(H) 782(N106(H)  BUN 6 - 20 mg/dL 14 16 17   Creatinine 0.61 - 1.24 mg/dL 5.620.74 1.300.82 8.650.91  Sodium 135 - 145 mmol/L 141 136 139  Potassium 3.5 - 5.1 mmol/L 4.3 4.3 4.7  Chloride 98 - 111 mmol/L 108 104 105  CO2 22 - 32 mmol/L 27 24 23   Calcium 8.9 - 10.3 mg/dL  8.2(L) 7.9(L) 8.6(L)  Total Protein 6.5 - 8.1 g/dL - - 6.9  Total Bilirubin 0.3 - 1.2 mg/dL - - 0.7  Alkaline Phos 38 - 126 U/L - - 88  AST 15 - 41 U/L - - 71(H)  ALT 0 - 44 U/L - - 82(H)    Imaging studies:  I personally evaluated the lower extremity ultrasound.  There is no sign of DVT.  Assessment/Plan:  43 y.o. male with right knee cellulitis, complicated by pertinent  comorbidities including asthma. Patient with a right knee cellulitis with fluid collection.  Patient personally drained the fluid collection.  The patient is responding to current antibiotic therapy.  The cellulitis that was extending up to the proximal thigh has been concentrated just around the fluid collection in the anterior right knee in front of the patella.  That focal area is warm to touch and tender to palpation but it does not seem to have a deeper fluid collection that needs to be drained.  I recommend to continue with current antibiotic therapy.  I talked to the nurse to put warm compresses.  Patient can shower and let the area continue draining.  I also recommend that if patient does not improve further and more fluid collects, the specialist to drain this collection will be orthopedic surgeons due to the proximity of the knee and the patella.  Can contact me if any further question or I can be of assistance in the management of this case.    Arnold Long, MD

## 2019-07-08 NOTE — Progress Notes (Signed)
PROGRESS NOTE  David Leach TDD:[id number] DOB: [birth year] DOA: 07/05/2019 PCP: Patient, No Pcp Per  HPI/Recap of past 59 hours: 43 year old male with past medical history of asthma, PE and ongoing tobacco abuse presented to the emergency room on the night of 11/5 with complaints of right knee pain and swelling that have been going on for several days.  He had a small wound on his right knee which she was not sure how it had gotten there in over the past few days, it has gotten worse with severe swelling and erythema.  The redness had gone up to his right thigh close to his groin.  He did note a fever.  In the emergency room, lab work noted a white count of 13.7, with a BUN of 22 and creatinine of 1.39.  Lactic acid level normal.  Patient was given IV antibiotics and fluids.  Lower extremity ultrasound negative for DVT.  Blood cultures drawn and are pending.  Hospitalist were called for admission.  Over the past few days, erythema continues to decrease as does pain, according to patient.  Mobility slightly increasing.  White blood cell count normalized today.  Assessment/Plan: Active Problems:   Cellulitis of right knee: Patient initially received broad-spectrum antibiotics, narrowed to IV Rocephin.  He appears to be stable and no evidence of sepsis.  Cellulitis areas look to be receding and now much more localized to the anterior aspect of the knee.  No active drainage, but wound that because initial problem appears to be scabbed over and now with collection of fluid.  Have asked surgery to see if wound needs any kind of intervention.  Tobacco abuse: Counseled  History of asthma: Stable  Code Status: Full code  Family Communication: Left message for mother  Disposition Plan: Home likely tomorrow   Consultants:  None  Procedures:  Lower extremity Doppler, negative for DVT  Antimicrobials:  IV Rocephin 11/6-present  IV vancomycin and Zosyn 11/6 only  DVT  prophylaxis: Lovenox   Objective: Vitals:   07/08/19 0432 07/08/19 1242  BP: (!) 101/59 128/72  Pulse: 67 67  Resp: 20 18  Temp: 98.3 F (36.8 C) 98.4 F (36.9 C)  SpO2: 97% 100%    Intake/Output Summary (Last 24 hours) at 07/08/2019 1250 Last data filed at 07/08/2019 1243 Gross per 24 hour  Intake 3987.1 ml  Output 2950 ml  Net 1037.1 ml   Filed Weights   07/05/19 1920 07/06/19 2212  Weight: 80.7 kg 76.9 kg   Body mass index is 23.65 kg/m.  Exam:   General: Alert oriented x3, mild distress secondary to pain  HEENT: Normocephalic and atraumatic, mucous membranes are moist  Cardiovascular: Regular rate and rhythm, S1-S2  Respiratory: Clear to auscultation bilaterally  Abdomen: Soft, nontender, nondistended, positive bowel sounds  Musculoskeletal: No clubbing or cyanosis, right lower extremity has moderate swelling at right knee with a small half centimeter wound that is now scabbed over, with a fluid-filled pocket, nondraining with surrounding erythema involving anterior aspect of the right knee and the cellulitis involving his right thigh and down to his foot has resolved  Skin: Erythema as described above  Psychiatry: Appropriate, no evidence of psychoses   Data Reviewed: CBC: Recent Labs  Lab 07/05/19 1923 07/06/19 0446 07/07/19 0506 07/08/19 0421  WBC 13.7* 13.9* 10.9* 9.6  NEUTROABS 8.4*  --   --   --   HGB 12.5* 12.2* 11.5* 11.5*  HCT 38.8* 38.2* 35.3* 34.4*  MCV 90.4 92.5 91.2 89.1  PLT 284 252 253 244   Basic Metabolic Panel: Recent Labs  Lab 07/05/19 1923 07/06/19 0446 07/07/19 0506 07/08/19 0421  NA 137 139 136 141  K 4.5 4.7 4.3 4.3  CL 103 105 104 108  CO2 25 23 24 27   GLUCOSE 97 106* 111* 105*  BUN 22* 17 16 14   CREATININE 1.39* 0.91 0.82 0.74  CALCIUM 9.2 8.6* 7.9* 8.2*   GFR: Estimated Creatinine Clearance: 128.1 mL/min (by C-G formula based on SCr of 0.74 mg/dL). Liver Function Tests: Recent Labs  Lab 07/05/19 1923  07/06/19 0446  AST 74* 71*  ALT 93* 82*  ALKPHOS 104 88  BILITOT 0.5 0.7  PROT 7.6 6.9  ALBUMIN 3.9 3.5   No results for input(s): LIPASE, AMYLASE in the last 168 hours. No results for input(s): AMMONIA in the last 168 hours. Coagulation Profile: No results for input(s): INR, PROTIME in the last 168 hours. Cardiac Enzymes: No results for input(s): CKTOTAL, CKMB, CKMBINDEX, TROPONINI in the last 168 hours. BNP (last 3 results) No results for input(s): PROBNP in the last 8760 hours. HbA1C: No results for input(s): HGBA1C in the last 72 hours. CBG: No results for input(s): GLUCAP in the last 168 hours. Lipid Profile: No results for input(s): CHOL, HDL, LDLCALC, TRIG, CHOLHDL, LDLDIRECT in the last 72 hours. Thyroid Function Tests: No results for input(s): TSH, T4TOTAL, FREET4, T3FREE, THYROIDAB in the last 72 hours. Anemia Panel: No results for input(s): VITAMINB12, FOLATE, FERRITIN, TIBC, IRON, RETICCTPCT in the last 72 hours. Urine analysis:    Component Value Date/Time   COLORURINE YELLOW (A) 02/28/2017 1915   APPEARANCEUR CLEAR (A) 02/28/2017 1915   LABSPEC 1.017 02/28/2017 1915   PHURINE 6.0 02/28/2017 1915   GLUCOSEU NEGATIVE 02/28/2017 1915   HGBUR NEGATIVE 02/28/2017 1915   BILIRUBINUR NEGATIVE 02/28/2017 1915   KETONESUR NEGATIVE 02/28/2017 1915   PROTEINUR NEGATIVE 02/28/2017 1915   UROBILINOGEN 0.2 11/10/2014 1735   NITRITE NEGATIVE 02/28/2017 1915   LEUKOCYTESUR NEGATIVE 02/28/2017 1915   Sepsis Labs: @LABRCNTIP (procalcitonin:4,lacticidven:4)  ) Recent Results (from the past 240 hour(s))  Blood culture (routine x 2)     Status: None (Preliminary result)   Collection Time: 07/05/19  7:28 PM   Specimen: BLOOD  Result Value Ref Range Status   Specimen Description BLOOD RIGHT ANTECUBITAL  Final   Special Requests   Final    BOTTLES DRAWN AEROBIC ONLY Blood Culture adequate volume   Culture   Final    NO GROWTH 3 DAYS Performed at [facility],  [address] [place], [place] [id number]    Report Status PENDING  Incomplete  SARS CORONAVIRUS 2 (TAT 6-24 HRS) Nasopharyngeal Nasopharyngeal Swab     Status: None   Collection Time: 07/06/19 10:40 PM   Specimen: Nasopharyngeal Swab  Result Value Ref Range Status   SARS Coronavirus 2 NEGATIVE NEGATIVE Final    Comment: (NOTE) SARS-CoV-2 target nucleic acids are NOT DETECTED. The SARS-CoV-2 RNA is generally detectable in upper and lower respiratory specimens during the acute phase of infection. Negative results do not preclude SARS-CoV-2 infection, do not rule out co-infections with other pathogens, and should not be used as the sole basis for treatment or other patient management decisions. Negative results must be combined with clinical observations, patient history, and epidemiological information. The expected result is Negative. Fact Sheet for Patients: HairSlick.nohttps://www.fda.gov/media/138098/download Fact Sheet for Healthcare Providers: quierodirigir.comhttps://www.fda.gov/media/138095/download This test is not yet approved or cleared by the [place] FDA and  has been authorized  for detection and/or diagnosis of SARS-CoV-2 by FDA under an Emergency Use Authorization (EUA). This EUA will remain  in effect (meaning this test can be used) for the duration of the COVID-19 declaration under Section 56 4(b)(1) of the Act, 21 U.S.C. section 360bbb-3(b)(1), unless the authorization is terminated or revoked sooner. Performed at [facility], [address] [place], [place] [address]       Studies: No results found.  Scheduled Meds:  enoxaparin (LOVENOX) injection  40 mg Subcutaneous Q24H    Continuous Infusions:  sodium chloride 100 mL/hr at 07/08/19 1243   cefTRIAXone (ROCEPHIN)  IV Stopped (07/07/19 2217)     LOS: 2 days     Hollice Espy, MD [facility]  To reach me or the doctor on call, go to: [email or web address] Password [facility]  07/08/2019, 12:50 PM

## 2019-07-08 NOTE — Plan of Care (Signed)
Patient doing well today.  Right knee is still red and swollen but the redness has decreased from where it was marked.  Patient still having pain but it is being contorlled with Morphine and Percocet.  No significant changes.

## 2019-07-08 NOTE — Discharge Instructions (Addendum)
If your knee is not improving, follow up at a local urgent care or get established with a primary care doctor    Cellulitis, Adult  Cellulitis is a skin infection. The infected area is often warm, red, swollen, and sore. It occurs most often in the arms and lower legs. It is very important to get treated for this condition. What are the causes? This condition is caused by bacteria. The bacteria enter through a break in the skin, such as a cut, burn, insect bite, open sore, or crack. What increases the risk? This condition is more likely to occur in people who:  Have a weak body defense system (immune system).  Have open cuts, burns, bites, or scrapes on the skin.  Are older than 42 years of age.  Have a blood sugar problem (diabetes).  Have a long-lasting (chronic) liver disease (cirrhosis) or kidney disease.  Are very overweight (obese).  Have a skin problem, such as: ? Itchy rash (eczema). ? Slow movement of blood in the veins (venous stasis). ? Fluid buildup below the skin (edema).  Have been treated with high-energy rays (radiation).  Use IV drugs. What are the signs or symptoms? Symptoms of this condition include:  Skin that is: ? Red. ? Streaking. ? Spotting. ? Swollen. ? Sore or painful when you touch it. ? Warm.  A fever.  Chills.  Blisters. How is this diagnosed? This condition is diagnosed based on:  Medical history.  Physical exam.  Blood tests.  Imaging tests. How is this treated? Treatment for this condition may include:  Medicines to treat infections or allergies.  Home care, such as: ? Rest. ? Placing cold or warm cloths (compresses) on the skin.  Hospital care, if the condition is very bad. Follow these instructions at home: Medicines  Take over-the-counter and prescription medicines only as told by your doctor.  If you were prescribed an antibiotic medicine, take it as told by your doctor. Do not stop taking it even if you  start to feel better. General instructions   Drink enough fluid to keep your pee (urine) pale yellow.  Do not touch or rub the infected area.  Raise (elevate) the infected area above the level of your heart while you are sitting or lying down.  Place cold or warm cloths on the area as told by your doctor.  Keep all follow-up visits as told by your doctor. This is important. Contact a doctor if:  You have a fever.  You do not start to get better after 1-2 days of treatment.  Your bone or joint under the infected area starts to hurt after the skin has healed.  Your infection comes back. This can happen in the same area or another area.  You have a swollen bump in the area.  You have new symptoms.  You feel ill and have muscle aches and pains. Get help right away if:  Your symptoms get worse.  You feel very sleepy.  You throw up (vomit) or have watery poop (diarrhea) for a long time.  You see red streaks coming from the area.  Your red area gets larger.  Your red area turns dark in color. These symptoms may represent a serious problem that is an emergency. Do not wait to see if the symptoms will go away. Get medical help right away. Call your local emergency services (911 in the [place] Do not drive yourself to the hospital. Summary  Cellulitis is a skin infection. The area is  often warm, red, swollen, and sore.  This condition is treated with medicines, rest, and cold and warm cloths.  Take all medicines only as told by your doctor.  Tell your doctor if symptoms do not start to get better after 1-2 days of treatment. This information is not intended to replace advice given to you by your health care provider. Make sure you discuss any questions you have with your health care provider. Document Released: 02/02/2008 Document Revised: 01/05/2018 Document Reviewed: 01/05/2018 Elsevier Patient Education  2020 [employer].

## 2019-07-09 MED ORDER — OXYCODONE HCL 5 MG PO TABS: 2.5000 mg | ORAL_TABLET | Freq: Four times a day (QID) | ORAL | 0 refills | Status: DC | PRN

## 2019-07-09 MED ORDER — BACITRACIN-NEOMYCIN-POLYMYXIN OINTMENT TUBE: 1.0000 "application " | TOPICAL_OINTMENT | Freq: Every day | CUTANEOUS | 0 refills | Status: DC

## 2019-07-09 MED ORDER — BACITRACIN-NEOMYCIN-POLYMYXIN 400-5-5000 EX OINT
TOPICAL_OINTMENT | Freq: Every day | CUTANEOUS | Status: DC
  Administered 2019-07-09: 1 via TOPICAL
  Filled 2019-07-09: qty 1

## 2019-07-09 MED ORDER — DOXYCYCLINE HYCLATE 100 MG PO TBEC: 100.0000 mg | DELAYED_RELEASE_TABLET | Freq: Two times a day (BID) | ORAL | 0 refills | Status: AC

## 2019-07-09 NOTE — Discharge Summary (Addendum)
Discharge Summary  David Leach DOB: 1976/06/24  PCP: Patient, No Pcp Per  Admit date: 07/05/2019 Discharge date: 07/09/2019  Time spent: 25 minutes  Recommendations for Outpatient Follow-up:  1. New medication: Doxycycline 100 mg p.o. twice daily x3 days 2. New medication: OxyIR 5 mg (one half tab p.o. every 6 hours as needed pain) total number-4 tablets 3. Patient will keep the wound area clean putting Neosporin and a daily bandage over it  Discharge Diagnoses:  Active Hospital Problems   Diagnosis Date Noted   Cellulitis of right knee 07/06/2019   Tobacco abuse 07/06/2019   Asthma 11/06/2016    Resolved Hospital Problems  No resolved problems to display.    Discharge Condition: Improved, being discharged home  Diet recommendation: Regular diet  Vitals:   07/09/19 0523 07/09/19 0811  BP: 111/66 118/68  Pulse: 73 63  Resp: 16 12  Temp: 99.3 F (37.4 C) 98.7 F (37.1 C)  SpO2: 96% 100%    History of present illness:  43 year old male with past medical history of asthma, PE and ongoing tobacco abuse presented to the emergency room on the night of 11/5 with complaints of right knee pain and swelling that have been going on for several days.  He had a small wound on his right knee which she was not sure how it had gotten there in over the past few days, it has gotten worse with severe swelling and erythema.  The redness had gone up to his right thigh close to his groin.  He did note a fever.  In the emergency room, lab work noted a white count of 13.7, with a BUN of 22 and creatinine of 1.39.  Lactic acid level normal.  Patient was given IV antibiotics and fluids.  Lower extremity ultrasound negative for DVT.  Blood cultures drawn and are pending.  Hospitalist were called for admission.  Hospital Course:  Active Problems:   Asthma: Stable during this medical hospitalization.    Cellulitis of right knee:Patient initially received broad-spectrum  antibiotics, narrowed to IV Rocephin.  He appears to be stable and no evidence of sepsis.  Over the next few days, cellulitis continue to recede and became more localized to just the anterior aspect of his knee.  There is no involvement in the knee joint, but the open wound which caused all this did develop a scabbed over fluid-filled pocket which the patient himself drained.  Asked general surgery for their input and they felt no need for debridement.  Visit for now, treat with antibiotics and should he not improve, he will need to be referred to orthopedic surgery, which was not needed at this time.  Patient will be discharged home on 3 more days of doxycycline for total of 7 days of antibiotics.    Tobacco abuse: Counseled.  Did not require nicotine patch during hospitalization.   Procedures:  None  Consultations:  General surgery  Discharge Exam: BP 118/68 (BP Location: Right Arm)    Pulse 63    Temp 98.7 F (37.1 C) (Oral)    Resp 12    Ht 5\' 11"  (1.803 m)    Wt 76.9 kg    SpO2 100%    BMI 23.65 kg/m   General: Alert and oriented x3, no acute distress Cardiovascular: Regular rate and rhythm, S1-S2 Respiratory: Clear to auscultation bilaterally Right knee: Moderate amount of erythema, localized to just the anterior aspect of the knee, much improved from previous days.  Small half centimeter open  minimally draining wound.  Discharge Instructions You were cared for by a hospitalist during your hospital stay. If you have any questions about your discharge medications or the care you received while you were in the hospital after you are discharged, you can call the unit and asked to speak with the hospitalist on call if the hospitalist that took care of you is not available. Once you are discharged, your primary care physician will handle any further medical issues. Please note that NO REFILLS for any discharge medications will be authorized once you are discharged, as it is imperative that  you return to your primary care physician (or establish a relationship with a primary care physician if you do not have one) for your aftercare needs so that they can reassess your need for medications and monitor your lab values.  Discharge Instructions    Diet - low sodium heart healthy   Complete by: As directed    Increase activity slowly   Complete by: As directed      Allergies as of 07/09/2019      Reactions   Hydrocodone Rash      Medication List    TAKE these medications   doxycycline 100 MG EC tablet Commonly known as: DORYX Take 1 tablet (100 mg total) by mouth 2 (two) times daily for 3 days.   neomycin-bacitracin-polymyxin Oint Commonly known as: NEOSPORIN Apply 1 application topically daily.   oxyCODONE 5 MG immediate release tablet Commonly known as: Oxy IR/ROXICODONE Take 0.5 tablets (2.5 mg total) by mouth every 6 (six) hours as needed for severe pain.       Allergies  Allergen Reactions   Hydrocodone Rash      The results of significant diagnostics from this hospitalization (including imaging, microbiology, ancillary and laboratory) are listed below for reference.    Significant Diagnostic Studies: US Venous Img Lower Unilateral Right  Result Date: 07/05/2019 CLINICAL DATA:  Right leg swelling. EXAM: RIGHT LOWER EXTREMITY VENOUS DOPPLER ULTRASOUND TECHNIQUE: Gray-scale sonography with graded compression, as well as color Doppler and duplex ultrasound were performed to evaluate the lower extremity deep venous systems from the level of the common femoral vein and including the common femoral, femoral, profunda femoral, popliteal and calf veins including the posterior tibial, peroneal and gastrocnemius veins when visible. The superficial great saphenous vein was also interrogated. Spectral Doppler was utilized to evaluate flow at rest and with distal augmentation maneuvers in the common femoral, femoral and popliteal veins. COMPARISON:  Bilateral lower  extremity venous doppler ultrasound dated December 27, 2016. FINDINGS: Contralateral Common Femoral Vein: Respiratory phasicity is normal and symmetric with the symptomatic side. No evidence of thrombus. Normal compressibility. Common Femoral Vein: No evidence of thrombus. Normal compressibility, respiratory phasicity and response to augmentation. Saphenofemoral Junction: No evidence of thrombus. Normal compressibility and flow on color Doppler imaging. Profunda Femoral Vein: No evidence of thrombus. Normal compressibility and flow on color Doppler imaging. Femoral Vein: No evidence of thrombus. Normal compressibility, respiratory phasicity and response to augmentation. Popliteal Vein: No evidence of thrombus. Normal compressibility, respiratory phasicity and response to augmentation. Calf Veins: No evidence of thrombus. Normal compressibility and flow on color Doppler imaging. Superficial Great Saphenous Vein: No evidence of thrombus. Normal compressibility. Venous Reflux:  None. Other Findings: Prominent soft tissue swelling around the right knee. IMPRESSION: 1. No evidence of deep venous thrombosis. 2. Prominent soft tissue swelling around the right knee. Electronically Signed   By: Obie Dredge M.D.   On: 07/05/2019 20:55  Microbiology: Recent Results (from the past 240 hour(s))  Blood culture (routine x 2)     Status: None (Preliminary result)   Collection Time: 07/05/19  7:28 PM   Specimen: BLOOD  Result Value Ref Range Status   Specimen Description BLOOD RIGHT ANTECUBITAL  Final   Special Requests   Final    BOTTLES DRAWN AEROBIC ONLY Blood Culture adequate volume   Culture   Final    NO GROWTH 4 DAYS Performed at [facility], [address] [place], [place] [address]    Report Status PENDING  Incomplete  SARS CORONAVIRUS 2 (TAT 6-24 HRS) Nasopharyngeal Nasopharyngeal Swab     Status: None   Collection Time: 07/06/19 10:40 PM   Specimen: Nasopharyngeal Swab  Result Value Ref  Range Status   SARS Coronavirus 2 NEGATIVE NEGATIVE Final    Comment: (NOTE) SARS-CoV-2 target nucleic acids are NOT DETECTED. The SARS-CoV-2 RNA is generally detectable in upper and lower respiratory specimens during the acute phase of infection. Negative results do not preclude SARS-CoV-2 infection, do not rule out co-infections with other pathogens, and should not be used as the sole basis for treatment or other patient management decisions. Negative results must be combined with clinical observations, patient history, and epidemiological information. The expected result is Negative. Fact Sheet for Patients: HairSlick.no Fact Sheet for Healthcare Providers: quierodirigir.com This test is not yet approved or cleared by the [place] FDA and  has been authorized for detection and/or diagnosis of SARS-CoV-2 by FDA under an Emergency Use Authorization (EUA). This EUA will remain  in effect (meaning this test can be used) for the duration of the COVID-19 declaration under Section 56 4(b)(1) of the Act, 21 U.S.C. section 360bbb-3(b)(1), unless the authorization is terminated or revoked sooner. Performed at [facility], [address] [place], [place] [address]   Aerobic/Anaerobic Culture (surgical/deep wound)     Status: None (Preliminary result)   Collection Time: 07/08/19  3:14 PM   Specimen: KNEE  Result Value Ref Range Status   Specimen Description   Final    KNEE Performed at [facility], [address], [place], [place] [address]    Special Requests   Final    NONE Performed at [facility], [address] [place], [place] [address]    Gram Stain   Final    FEW WBC PRESENT,BOTH PMN AND MONONUCLEAR RARE GRAM POSITIVE COCCI IN PAIRS    Culture   Final    FEW STAPHYLOCOCCUS AUREUS SUSCEPTIBILITIES TO FOLLOW Performed at [facility], [address], [place], [place] [address]      Report Status PENDING  Incomplete     Labs: Basic Metabolic Panel: Recent Labs  Lab 07/05/19 1923 07/06/19 0446 07/07/19 0506 07/08/19 0421  NA 137 139 136 141  K 4.5 4.7 4.3 4.3  CL 103 105 104 108  CO2 25 23 24 27   GLUCOSE 97 106* 111* 105*  BUN 22* 17 16 14   CREATININE 1.39* 0.91 0.82 0.74  CALCIUM 9.2 8.6* 7.9* 8.2*   Liver Function Tests: Recent Labs  Lab 07/05/19 1923 07/06/19 0446  AST 74* 71*  ALT 93* 82*  ALKPHOS 104 88  BILITOT 0.5 0.7  PROT 7.6 6.9  ALBUMIN 3.9 3.5   No results for input(s): LIPASE, AMYLASE in the last 168 hours. No results for input(s): AMMONIA in the last 168 hours. CBC: Recent Labs  Lab 07/05/19 1923 07/06/19 0446 07/07/19 0506 07/08/19 0421  WBC 13.7* 13.9* 10.9* 9.6  NEUTROABS 8.4*  --   --   --   HGB 12.5* 12.2* 11.5* 11.5*  HCT 38.8* 38.2* 35.3* 34.4*  MCV 90.4 92.5 91.2 89.1  PLT 284 252 253 244   Cardiac Enzymes: No results for input(s): CKTOTAL, CKMB, CKMBINDEX, TROPONINI in the last 168 hours. BNP: BNP (last 3 results) No results for input(s): BNP in the last 8760 hours.  ProBNP (last 3 results) No results for input(s): PROBNP in the last 8760 hours.  CBG: No results for input(s): GLUCAP in the last 168 hours.     Signed:  Hollice EspySendil K Lola Lofaro, MD [facility] 07/09/2019, 11:44 AM

## 2019-07-09 NOTE — Progress Notes (Signed)
07/09/2019 1:53 PM  David Leach to be D/C'd Home per MD order.  Discussed prescriptions and follow up appointments with the patient. Prescriptions given to patient, medication list explained in detail. Pt verbalized understanding.  Allergies as of 07/09/2019      Reactions   Hydrocodone Rash      Medication List    TAKE these medications   doxycycline 100 MG EC tablet Commonly known as: DORYX Take 1 tablet (100 mg total) by mouth 2 (two) times daily for 3 days. Notes to patient: Before bed 07/09/19   neomycin-bacitracin-polymyxin Oint Commonly known as: NEOSPORIN Apply 1 application topically daily. Notes to patient: Morning 07/10/19   oxyCODONE 5 MG immediate release tablet Commonly known as: Oxy IR/ROXICODONE Take 0.5 tablets (2.5 mg total) by mouth every 6 (six) hours as needed for severe pain. Notes to patient: As needed       Vitals:   07/09/19 0523 07/09/19 0811  BP: 111/66 118/68  Pulse: 73 63  Resp: 16 12  Temp: 99.3 F (37.4 C) 98.7 F (37.1 C)  SpO2: 96% 100%    Skin clean, dry and intact without evidence of skin break down, no evidence of skin tears noted. IV catheter discontinued intact. Site without signs and symptoms of complications. Dressing and pressure applied. Pt denies pain at this time. No complaints noted.  An After Visit Summary was printed and given to the patient. Patient escorted via [place], and D/C home via private auto.  David Leach

## 2019-07-10 LAB — CULTURE, BLOOD (ROUTINE X 2)
Culture: NO GROWTH
Special Requests: ADEQUATE

## 2019-07-13 LAB — AEROBIC/ANAEROBIC CULTURE W GRAM STAIN (SURGICAL/DEEP WOUND)

## 2019-08-23 ENCOUNTER — Emergency Department
Admission: EM | Admit: 2019-08-23 | Discharge: 2019-08-23 | Disposition: A | Discharge status: Home / Self Care | Payer: No Typology Code available for payment source | Attending: Emergency Medicine | Admitting: Emergency Medicine

## 2019-08-23 ENCOUNTER — Encounter: Payer: Self-pay | Admitting: Intensive Care

## 2019-08-23 ENCOUNTER — Other Ambulatory Visit: Payer: Self-pay

## 2019-08-23 ENCOUNTER — Emergency Department: Payer: No Typology Code available for payment source

## 2019-08-23 DIAGNOSIS — M545 Low back pain, unspecified: Secondary | ICD-10-CM

## 2019-08-23 DIAGNOSIS — Y9389 Activity, other specified: Secondary | ICD-10-CM | POA: Insufficient documentation

## 2019-08-23 DIAGNOSIS — F17210 Nicotine dependence, cigarettes, uncomplicated: Secondary | ICD-10-CM | POA: Diagnosis not present

## 2019-08-23 DIAGNOSIS — S0990XA Unspecified injury of head, initial encounter: Secondary | ICD-10-CM | POA: Diagnosis present

## 2019-08-23 DIAGNOSIS — Y92410 Unspecified street and highway as the place of occurrence of the external cause: Secondary | ICD-10-CM | POA: Diagnosis not present

## 2019-08-23 DIAGNOSIS — J45909 Unspecified asthma, uncomplicated: Secondary | ICD-10-CM | POA: Insufficient documentation

## 2019-08-23 DIAGNOSIS — S161XXA Strain of muscle, fascia and tendon at neck level, initial encounter: Secondary | ICD-10-CM

## 2019-08-23 DIAGNOSIS — Y999 Unspecified external cause status: Secondary | ICD-10-CM | POA: Insufficient documentation

## 2019-08-23 DIAGNOSIS — S0003XA Contusion of scalp, initial encounter: Secondary | ICD-10-CM

## 2019-08-23 DIAGNOSIS — S8001XA Contusion of right knee, initial encounter: Secondary | ICD-10-CM | POA: Diagnosis not present

## 2019-08-23 MED ORDER — OXYCODONE HCL 5 MG PO CAPS: 5.0000 mg | ORAL_CAPSULE | ORAL | 0 refills | Status: DC | PRN

## 2019-08-23 MED ORDER — METHOCARBAMOL 500 MG PO TABS: 500.0000 mg | ORAL_TABLET | Freq: Four times a day (QID) | ORAL | 0 refills | Status: DC | PRN
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
## 2019-08-23 NOTE — Discharge Instructions (Addendum)
Follow-up with your primary care provider if any continued problems.  Begin taking medication only as directed.  Do not take medication while driving or operating machinery as it could cause drowsiness and increase your risk for injury.  You may use ice or heat to your muscles as needed for discomfort.  Even with medication you will be sore or stiff for the next 4 to 5 days.  Return to the emergency department over the holiday weekend if any severe worsening of your symptoms or urgent concerns.
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
## 2019-09-19 ENCOUNTER — Emergency Department: Payer: Self-pay

## 2019-09-19 ENCOUNTER — Encounter: Payer: Self-pay | Admitting: Emergency Medicine

## 2019-09-19 ENCOUNTER — Emergency Department
Admission: EM | Admit: 2019-09-19 | Discharge: 2019-09-20 | Disposition: A | Discharge status: Home / Self Care | Payer: Self-pay | Attending: Emergency Medicine | Admitting: Emergency Medicine

## 2019-09-19 ENCOUNTER — Other Ambulatory Visit: Payer: Self-pay

## 2019-09-19 DIAGNOSIS — S300XXA Contusion of lower back and pelvis, initial encounter: Secondary | ICD-10-CM | POA: Insufficient documentation

## 2019-09-19 DIAGNOSIS — R1011 Right upper quadrant pain: Secondary | ICD-10-CM | POA: Insufficient documentation

## 2019-09-19 DIAGNOSIS — Y998 Other external cause status: Secondary | ICD-10-CM | POA: Insufficient documentation

## 2019-09-19 DIAGNOSIS — Y929 Unspecified place or not applicable: Secondary | ICD-10-CM | POA: Insufficient documentation

## 2019-09-19 DIAGNOSIS — J45909 Unspecified asthma, uncomplicated: Secondary | ICD-10-CM | POA: Insufficient documentation

## 2019-09-19 DIAGNOSIS — W01198A Fall on same level from slipping, tripping and stumbling with subsequent striking against other object, initial encounter: Secondary | ICD-10-CM | POA: Insufficient documentation

## 2019-09-19 DIAGNOSIS — F1510 Other stimulant abuse, uncomplicated: Secondary | ICD-10-CM | POA: Insufficient documentation

## 2019-09-19 DIAGNOSIS — F17210 Nicotine dependence, cigarettes, uncomplicated: Secondary | ICD-10-CM | POA: Insufficient documentation

## 2019-09-19 DIAGNOSIS — Y9389 Activity, other specified: Secondary | ICD-10-CM | POA: Insufficient documentation

## 2019-09-19 LAB — URINALYSIS, COMPLETE (UACMP) WITH MICROSCOPIC
Bacteria, UA: NONE SEEN
Bilirubin Urine: NEGATIVE
Glucose, UA: NEGATIVE mg/dL
Hgb urine dipstick: NEGATIVE
Ketones, ur: NEGATIVE mg/dL
Leukocytes,Ua: NEGATIVE
Nitrite: NEGATIVE
Protein, ur: NEGATIVE mg/dL
Specific Gravity, Urine: 1.016 (ref 1.005–1.030)
Squamous Epithelial / HPF: NONE SEEN (ref 0–5)
pH: 6 (ref 5.0–8.0)

## 2019-09-19 LAB — COMPREHENSIVE METABOLIC PANEL
ALT: 98 U/L — ABNORMAL HIGH (ref 0–44)
AST: 75 U/L — ABNORMAL HIGH (ref 15–41)
Albumin: 4.2 g/dL (ref 3.5–5.0)
Alkaline Phosphatase: 95 U/L (ref 38–126)
Anion gap: 9 (ref 5–15)
BUN: 28 mg/dL — ABNORMAL HIGH (ref 6–20)
CO2: 29 mmol/L (ref 22–32)
Calcium: 8.9 mg/dL (ref 8.9–10.3)
Chloride: 99 mmol/L (ref 98–111)
Creatinine, Ser: 1.02 mg/dL (ref 0.61–1.24)
GFR calc Af Amer: >60 mL/min (ref >60)
GFR calc non Af Amer: >60 mL/min (ref >60)
Glucose, Bld: 86 mg/dL (ref 70–99)
Potassium: 4.6 mmol/L (ref 3.5–5.1)
Sodium: 137 mmol/L (ref 135–145)
Total Bilirubin: 0.5 mg/dL (ref 0.3–1.2)
Total Protein: 7.6 g/dL (ref 6.5–8.1)

## 2019-09-19 LAB — CBC WITH DIFFERENTIAL/PLATELET
Abs Immature Granulocytes: 0.01 10*3/uL (ref 0.00–0.07)
Basophils Absolute: 0.1 10*3/uL (ref 0.0–0.1)
Basophils Relative: 1 %
Eosinophils Absolute: 0.3 10*3/uL (ref 0.0–0.5)
Eosinophils Relative: 4 %
HCT: 40.8 % (ref 39.0–52.0)
Hemoglobin: 13.2 g/dL (ref 13.0–17.0)
Immature Granulocytes: 0 %
Lymphocytes Relative: 39 %
Lymphs Abs: 2.9 10*3/uL (ref 0.7–4.0)
MCH: 29.9 pg (ref 26.0–34.0)
MCHC: 32.4 g/dL (ref 30.0–36.0)
MCV: 92.3 fL (ref 80.0–100.0)
Monocytes Absolute: 1.2 10*3/uL — ABNORMAL HIGH (ref 0.1–1.0)
Monocytes Relative: 16 %
Neutro Abs: 3 10*3/uL (ref 1.7–7.7)
Neutrophils Relative %: 40 %
Platelets: 248 10*3/uL (ref 150–400)
RBC: 4.42 MIL/uL (ref 4.22–5.81)
RDW: 13.4 % (ref 11.5–15.5)
WBC: 7.5 10*3/uL (ref 4.0–10.5)
nRBC: 0 % (ref 0.0–0.2)

## 2019-09-19 LAB — LIPASE, BLOOD: Lipase: 21 U/L (ref 11–51)

## 2019-09-19 NOTE — ED Triage Notes (Signed)
Patient ambulatory to triage with steady gait, without difficulty or distress noted, mask in place; pt reports x month having rt sided abd/side pain with no accomp symptoms; also st he slipped and fell last night hitting back on door; area of bruising noted to rt posterior lower ribcage; st pain with deep breathing & movement

## 2019-09-20 ENCOUNTER — Emergency Department: Payer: Self-pay

## 2019-09-20 LAB — URINE DRUG SCREEN, QUALITATIVE (ARMC ONLY)
Amphetamines, Ur Screen: POSITIVE — AB
Barbiturates, Ur Screen: NOT DETECTED
Benzodiazepine, Ur Scrn: NOT DETECTED
Cannabinoid 50 Ng, Ur ~~LOC~~: NOT DETECTED
Cocaine Metabolite,Ur ~~LOC~~: NOT DETECTED
MDMA (Ecstasy)Ur Screen: NOT DETECTED
Methadone Scn, Ur: NOT DETECTED
Opiate, Ur Screen: NOT DETECTED
Phencyclidine (PCP) Ur S: NOT DETECTED
Tricyclic, Ur Screen: NOT DETECTED

## 2019-09-20 MED ORDER — KETOROLAC TROMETHAMINE 30 MG/ML IJ SOLN
30.0000 mg | Freq: Once | INTRAMUSCULAR | Status: AC
  Administered 2019-09-20: 30 mg via INTRAMUSCULAR
  Filled 2019-09-20: qty 1

## 2019-09-20 NOTE — ED Notes (Signed)
Patient transported to CT 

## 2019-09-20 NOTE — ED Notes (Signed)
Patient back from CT, complaining of pain. Educated on anatomy of abdomen and location of organs per patient request

## 2019-09-20 NOTE — ED Provider Notes (Signed)
[facility] Emergency Department Provider Note  ____________________________________________   First MD Initiated Contact with Patient 09/19/19 2349     (approximate)  I have reviewed the triage vital signs and the nursing notes.   HISTORY  Chief Complaint Abdominal Pain    HPI David Leach is a 44 y.o. male with below list of previous medical conditions presents to the emergency department secondary to right quadrant abdominal/right back flank pain times a month with acute worsening last night when patient states he fell last night hitting his back against a door.        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 Authorizing Provider  methocarbamol (ROBAXIN) 500 MG tablet Take 1 tablet (500 mg total) by mouth every 6 (six) hours as needed. 08/23/19   Johnn Hai, PA-C  neomycin-bacitracin-polymyxin (NEOSPORIN) OINT Apply 1 application topically daily. 07/09/19   Annita Brod, MD  oxyCODONE (OXY IR/ROXICODONE) 5 MG immediate release tablet Take 0.5 tablets (2.5 mg total) by mouth every 6 (six) hours as needed for severe pain. 07/09/19   Annita Brod, MD  oxycodone (OXY-IR) 5 MG capsule Take 1 capsule (5 mg total) by mouth every 4 (four) hours as needed. 08/23/19   Johnn Hai, PA-C     Allergies Hydrocodone  Family History  Problem Relation Age of Onset   Cancer Mother    Diabetes Father    Cancer Other    Stroke Other    Diabetes Other     Social History Social History   Tobacco Use   Smoking status: Current Every Day Smoker    Packs/day: 1.00    Years: 20.00    Pack years: 20.00    Types: Cigarettes   Smokeless tobacco: Former Systems developer    Types: Chew  Substance Use Topics   Alcohol use: Yes    Comment: occ   Drug use: No    Review of Systems Constitutional: No fever/chills Eyes: No visual changes. ENT: No sore throat. Cardiovascular: Denies chest pain. Respiratory: Denies shortness of breath. Gastrointestinal: RUQ/right flank pain No nausea, no vomiting.  No diarrhea.  No constipation. Genitourinary: Negative for dysuria. Musculoskeletal: Negative for neck pain.  Negative for back pain. Integumentary: Negative for rash. Neurological: Negative for headaches, focal weakness or numbness.   ____________________________________________   PHYSICAL EXAM:  VITAL SIGNS: ED Triage Vitals  Enc Vitals Group     BP 09/19/19 2024 129/84     Pulse Rate 09/19/19 2024 (!) 104     Resp 09/19/19 2024 18     Temp 09/19/19 2024 98.6 F (37 C)     Temp Source 09/19/19 2024 Oral     SpO2 09/19/19 2024 97 %     Weight 09/19/19  2027 83.9 kg (185 lb)     Height 09/19/19 2027 1.803 m (5\' 11" )     Head Circumference --      Peak Flow --      Pain Score 09/19/19 2027 8     Pain Loc --      Pain Edu? --      Excl. in GC? --     Constitutional: Alert and oriented.  Eyes: Conjunctivae are normal.  Head: Atraumatic. Mouth/Throat: Patient is wearing a mask. Neck: No stridor.  No meningeal signs.   Cardiovascular: Normal rate, regular rhythm. Good peripheral circulation. Grossly normal heart sounds. Respiratory: Normal respiratory effort.  No retractions. Gastrointestinal: Soft and nontender. No distention.  Musculoskeletal: 6cm x 6cm area of  ecchymosis right flankNo lower extremity tenderness nor edema. No gross deformities of extremities. Neurologic:  Normal speech and language. No gross focal neurologic deficits are appreciated.  Skin:  Skin is warm, dry and intact.6cm x 6cm area of ecchymosis right flank Psychiatric: Mood and affect are normal. Speech and behavior are normal.  ____________________________________________   LABS (all labs ordered are listed, but only abnormal results are displayed)  Labs Reviewed  CBC WITH DIFFERENTIAL/PLATELET - Abnormal; Notable for the following components:      Result Value   Monocytes Absolute 1.2 (*)    All other components within normal limits  COMPREHENSIVE METABOLIC PANEL - Abnormal; Notable for the following components:   BUN 28 (*)    AST 75 (*)    ALT 98 (*)    All other components within normal limits  URINALYSIS, COMPLETE (UACMP) WITH MICROSCOPIC - Abnormal; Notable for the following components:   Color, Urine YELLOW (*)    APPearance CLEAR (*)    All other components within normal limits  URINE DRUG SCREEN, QUALITATIVE ([facility] ONLY) - Abnormal; Notable for the following components:   Amphetamines, Ur Screen POSITIVE (*)    All other components within normal limits  LIPASE, BLOOD    RADIOLOGY I, Stidham N Dewanna Hurston, personally viewed and evaluated these images (plain radiographs) as part of my medical decision making, as well as reviewing the written report by the radiologist.  ED MD interpretation:  Negative chest Xray  Official radiology report(s): CT ABDOMEN PELVIS WO CONTRAST  Result Date: 09/20/2019 CLINICAL DATA:  Abdominal trauma. Right-sided testicle pain. Bruising to the right lower ribcage. EXAM: CT ABDOMEN AND PELVIS WITHOUT CONTRAST TECHNIQUE: Multidetector CT imaging of the abdomen and pelvis was performed following the standard protocol without IV contrast. COMPARISON:  July 29, 2013 FINDINGS: Lower chest: The lung bases are clear. The heart size is  normal. Hepatobiliary: The liver is normal. Normal gallbladder.There is no biliary ductal dilation. Pancreas: Normal contours without ductal dilatation. No peripancreatic fluid collection. Spleen: No splenic laceration or hematoma. Adrenals/Urinary Tract: --Adrenal glands: No adrenal hemorrhage. --Right kidney/ureter: No hydronephrosis or perinephric hematoma. --Left kidney/ureter: No hydronephrosis or perinephric hematoma. --Urinary bladder: Unremarkable. Stomach/Bowel: --Stomach/Duodenum: No hiatal hernia or other gastric abnormality. Normal duodenal course and caliber. --Small bowel: No dilatation or inflammation. --Colon: No focal abnormality. --Appendix: Normal. Vascular/Lymphatic: Atherosclerotic calcification is present within the non-aneurysmal abdominal aorta, without hemodynamically significant stenosis. --No retroperitoneal lymphadenopathy. --No mesenteric lymphadenopathy. --No pelvic or inguinal lymphadenopathy. Reproductive: Unremarkable Other: No ascites or free air. The abdominal wall is normal. Musculoskeletal. No acute displaced fractures. IMPRESSION: 1. No acute traumatic injury to the abdomen or pelvis. 2. Normal appendix. Electronically Signed   By: July 31, 2013 M.D.   On: 09/20/2019 00:41  DG Ribs Unilateral W/Chest Right  Result Date: 09/19/2019 CLINICAL DATA:  Pain status post fall EXAM: RIGHT RIBS AND CHEST - 3+ VIEW COMPARISON:  12/10/2018 FINDINGS: No fracture or other bone lesions are seen involving the ribs. There is no evidence of pneumothorax or pleural effusion. Both lungs are clear. Heart size and mediastinal contours are within normal limits. IMPRESSION: Negative. Electronically Signed   By: Katherine Mantle M.D.   On: 09/19/2019 21:01    __________________________________________  Procedures   ____________________________________________   INITIAL IMPRESSION / MDM / ASSESSMENT AND PLAN / ED COURSE  As part of my medical decision making, I reviewed the  following data within the electronic MEDICAL RECORD NUMBER   44 year old male with above history and physical exam chest x-ray/rib series revealed no evidence of rib fracture.  CT abdomen pelvis performed which revealed no acute intra-abdominal pathology.  Patient given Toradol in the emergency department. ____________________________________________  FINAL CLINICAL IMPRESSION(S) / ED DIAGNOSES  Final diagnoses:  Contusion of lower back, initial encounter     MEDICATIONS GIVEN DURING THIS VISIT:  Medications  ketorolac (TORADOL) 30 MG/ML injection 30 mg (30 mg Intramuscular Given 09/20/19 0027)     ED Discharge Orders    None      *Please note:  JAQUAVIS FELMLEE was evaluated in Emergency Department on 09/20/2019 for the symptoms described in the history of present illness. He was evaluated in the context of the global COVID-19 pandemic, which necessitated consideration that the patient might be at risk for infection with the SARS-CoV-2 virus that causes COVID-19. Institutional protocols and algorithms that pertain to the evaluation of patients at risk for COVID-19 are in a state of rapid change based on information released by regulatory bodies including the CDC and federal and state organizations. These policies and algorithms were followed during the patient's care in the ED.  Some ED evaluations and interventions may be delayed as a result of limited staffing during the pandemic.*  Note:  This document was prepared using Dragon voice recognition software and may include unintentional dictation errors.   Darci Current, MD 09/20/19 [phone]

## 2020-06-24 ENCOUNTER — Emergency Department
Admission: EM | Admit: 2020-06-24 | Discharge: 2020-06-24 | Disposition: A | Discharge status: Home / Self Care | Payer: Self-pay | Attending: Emergency Medicine | Admitting: Emergency Medicine

## 2020-06-24 ENCOUNTER — Emergency Department: Payer: Self-pay

## 2020-06-24 ENCOUNTER — Other Ambulatory Visit: Payer: Self-pay

## 2020-06-24 ENCOUNTER — Encounter: Payer: Self-pay | Admitting: Emergency Medicine

## 2020-06-24 DIAGNOSIS — I2699 Other pulmonary embolism without acute cor pulmonale: Secondary | ICD-10-CM | POA: Insufficient documentation

## 2020-06-24 DIAGNOSIS — J45909 Unspecified asthma, uncomplicated: Secondary | ICD-10-CM | POA: Insufficient documentation

## 2020-06-24 DIAGNOSIS — R079 Chest pain, unspecified: Secondary | ICD-10-CM | POA: Insufficient documentation

## 2020-06-24 DIAGNOSIS — F17210 Nicotine dependence, cigarettes, uncomplicated: Secondary | ICD-10-CM | POA: Insufficient documentation

## 2020-06-24 LAB — BASIC METABOLIC PANEL
Anion gap: 7 (ref 5–15)
BUN: 21 mg/dL — ABNORMAL HIGH (ref 6–20)
CO2: 28 mmol/L (ref 22–32)
Calcium: 8.7 mg/dL — ABNORMAL LOW (ref 8.9–10.3)
Chloride: 100 mmol/L (ref 98–111)
Creatinine, Ser: 1.04 mg/dL (ref 0.61–1.24)
GFR, Estimated: >60 mL/min (ref >60)
Glucose, Bld: 173 mg/dL — ABNORMAL HIGH (ref 70–99)
Potassium: 3.6 mmol/L (ref 3.5–5.1)
Sodium: 135 mmol/L (ref 135–145)

## 2020-06-24 LAB — CBC
HCT: 37.8 % — ABNORMAL LOW (ref 39.0–52.0)
Hemoglobin: 12.6 g/dL — ABNORMAL LOW (ref 13.0–17.0)
MCH: 30.6 pg (ref 26.0–34.0)
MCHC: 33.3 g/dL (ref 30.0–36.0)
MCV: 91.7 fL (ref 80.0–100.0)
Platelets: 261 10*3/uL (ref 150–400)
RBC: 4.12 MIL/uL — ABNORMAL LOW (ref 4.22–5.81)
RDW: 13.3 % (ref 11.5–15.5)
WBC: 8.9 10*3/uL (ref 4.0–10.5)
nRBC: 0 % (ref 0.0–0.2)

## 2020-06-24 LAB — TROPONIN I (HIGH SENSITIVITY)
Troponin I (High Sensitivity): 14 ng/L (ref <18)
Troponin I (High Sensitivity): 15 ng/L (ref <18)

## 2020-06-24 MED ORDER — TRAMADOL HCL 50 MG PO TABS: 50.0000 mg | ORAL_TABLET | Freq: Four times a day (QID) | ORAL | 0 refills | Status: DC | PRN

## 2020-06-24 MED ORDER — IOHEXOL 350 MG/ML SOLN
75.0000 mL | Freq: Once | INTRAVENOUS | Status: AC | PRN
  Administered 2020-06-24: 75 mL via INTRAVENOUS

## 2020-06-24 MED ORDER — MORPHINE SULFATE (PF) 4 MG/ML IV SOLN
4.0000 mg | Freq: Once | INTRAVENOUS | Status: AC
  Administered 2020-06-24: 4 mg via INTRAVENOUS
  Filled 2020-06-24: qty 1

## 2020-06-24 MED ORDER — ONDANSETRON HCL 4 MG/2ML IJ SOLN
4.0000 mg | Freq: Once | INTRAMUSCULAR | Status: AC
  Administered 2020-06-24: 4 mg via INTRAVENOUS
  Filled 2020-06-24: qty 2

## 2020-06-24 NOTE — ED Provider Notes (Signed)
[facility] Emergency Department Provider Note  Time seen: 4:22 PM  I have reviewed the triage vital signs and the nursing notes.   HISTORY  Chief Complaint Chest Pain   HPI David Leach is a 44 y.o. male with a past medical history of asthma, prior DVT and PE not currently on anticoagulation presents to the emergency department for chest pain. According to the patient since last night he has had pain in his central left chest described as moderate and sharp worse with deep inspiration. Denies any leg pain or swelling. Patient states a year and a half ago he had PEs in his right and left lung was on anticoagulation but is no longer on anticoagulation. Denies any significant shortness of breath, nausea or diaphoresis. No fever cough or congestion.   Past Medical History:  Diagnosis Date  . Asthma   . Chronic dental pain   . Chronic shoulder pain   . DVT (deep venous thrombosis) (HCC)   . Headache   . Pulmonary embolism (HCC)   . Right-sided back pain     Patient Active Problem List   Diagnosis Date Noted  . Cellulitis of right knee 07/06/2019  . Tobacco abuse 07/06/2019  . 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 Foot pain, bilateral 12/27/2016  . Pleuritic chest pain 12/27/2016  . Acute renal insufficiency 12/27/2016  . Pain of foot 12/27/2016  . Asthma 11/06/2016    Past Surgical History:  Procedure Laterality Date  . TONSILLECTOMY      Prior to Admission medications   Medication Sig Start Date End Date Taking? Authorizing Provider  methocarbamol (ROBAXIN) 500 MG tablet Take 1 tablet (500 mg total) by mouth every 6 (six) hours as needed. 08/23/19   Tommi Rumps, PA-C  neomycin-bacitracin-polymyxin (NEOSPORIN) OINT Apply 1 application topically daily. 07/09/19   Hollice Espy, MD  oxyCODONE (OXY IR/ROXICODONE) 5 MG immediate release tablet Take 0.5 tablets (2.5 mg total) by mouth every 6 (six) hours as needed for severe pain. 07/09/19    Hollice Espy, MD  oxycodone (OXY-IR) 5 MG capsule Take 1 capsule (5 mg total) by mouth every 4 (four) hours as needed. 08/23/19   Tommi Rumps, PA-C    Allergies  Allergen Reactions  . Hydrocodone Rash    Family History  Problem Relation Age of Onset  . Cancer Mother   . Diabetes Father   . Cancer Other   . Stroke Other   . Diabetes Other     Social History Social History   Tobacco Use  . Smoking status: Current Every Day Smoker    Packs/day: 1.00    Years: 20.00    Pack years: 20.00    Types: Cigarettes  . Smokeless tobacco: Former Neurosurgeon    Types: Chew  Substance Use Topics  . Alcohol use: Yes    Comment: occ  . Drug use: No    Review of Systems Constitutional: Negative for fever Cardiovascular: Moderate central left chest pain worse with deep inspiration. Respiratory: Negative for shortness of breath. Gastrointestinal: Negative for abdominal pain, vomiting  Genitourinary: Negative for urinary compaints Musculoskeletal: Negative for musculoskeletal complaints Neurological: Negative for headache All other ROS negative  ____________________________________________   PHYSICAL EXAM:  VITAL SIGNS: ED Triage Vitals  Enc Vitals Group     BP 06/24/20 1349 (!) 149/86     Pulse Rate 06/24/20 1349 93     Resp 06/24/20 1349 20     Temp 06/24/20 1349 98.3 F (  36.8 C)     Temp Source 06/24/20 1349 Oral     SpO2 06/24/20 1349 100 %     Weight 06/24/20 1350 185 lb (83.9 kg)     Height 06/24/20 1350 5\' 11"  (1.803 m)     Head Circumference --      Peak Flow --      Pain Score 06/24/20 1350 9     Pain Loc --      Pain Edu? --      Excl. in GC? --     Constitutional: Alert and oriented. Well appearing and in no distress. Eyes: Normal exam ENT      Head: Normocephalic and atraumatic.      Mouth/Throat: Mucous membranes are moist. Cardiovascular: Normal rate, regular rhythm. No murmur Respiratory: Normal respiratory effort without tachypnea nor  retractions. Breath sounds are clear. Mild chest wall tenderness to palpation. Gastrointestinal: Soft and nontender. No distention Musculoskeletal: Nontender with normal range of motion in all extremities. No lower extremity tenderness or edema. Neurologic:  Normal speech and language. No gross focal neurologic deficits Skin:  Skin is warm, dry and intact.  Psychiatric: Mood and affect are normal.   ____________________________________________   EKG:  EKG viewed and interpreted by myself shows a sinus rhythm at 84 bpm with a narrow QRS, normal axis, normal intervals, nonspecific ST changes.  RADIOLOGY  Chest x-ray is negative  ____________________________________________   INITIAL IMPRESSION / ASSESSMENT AND PLAN / ED COURSE  Pertinent labs & imaging results that were available during my care of the patient were reviewed by me and considered in my medical decision making (see chart for details).   Patient presents to the emergency department for chest pain worse with deep inspiration. 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 Patient's work-up is thus far reassuring including a negative troponin, and reassuring EKG. However given the patient's history of bilateral PE and now with pleuritic chest pain we will proceed with CTA imaging of the chest to rule out pulmonary embolus. We will dose pain and nausea medication while awaiting CT results. Patient agreeable to plan of care.  CTA negative for PE.  Reassuring work-up including negative troponin x2.  We will discharge patient home with PCP follow-up.  David Leach was evaluated in Emergency Department on 06/24/2020 for the symptoms described in the history of present illness. He was evaluated in the context of the global COVID-19 pandemic, which necessitated consideration that the patient might be at risk for infection with the SARS-CoV-2 virus that causes COVID-19. Institutional protocols and algorithms that pertain  to the evaluation of patients at risk for COVID-19 are in a state of rapid change based on information released by regulatory bodies including the CDC and federal and state organizations. These policies and algorithms were followed during the patient's care in the ED.  ____________________________________________   FINAL CLINICAL IMPRESSION(S) / ED DIAGNOSES  Chest pain   06/26/2020, MD 06/24/20 1758

## 2020-06-24 NOTE — ED Triage Notes (Signed)
Patient to ER for c/o chest pain since last night. Patient denies h/o the same.

## 2020-07-02 ENCOUNTER — Other Ambulatory Visit: Payer: Self-pay

## 2020-07-02 ENCOUNTER — Emergency Department: Payer: Self-pay

## 2020-07-02 ENCOUNTER — Emergency Department
Admission: EM | Admit: 2020-07-02 | Discharge: 2020-07-03 | Disposition: A | Discharge status: Home / Self Care | Payer: Self-pay | Attending: Emergency Medicine | Admitting: Emergency Medicine

## 2020-07-02 DIAGNOSIS — G8929 Other chronic pain: Secondary | ICD-10-CM | POA: Insufficient documentation

## 2020-07-02 DIAGNOSIS — M549 Dorsalgia, unspecified: Secondary | ICD-10-CM

## 2020-07-02 DIAGNOSIS — M5459 Other low back pain: Secondary | ICD-10-CM | POA: Insufficient documentation

## 2020-07-02 DIAGNOSIS — F17210 Nicotine dependence, cigarettes, uncomplicated: Secondary | ICD-10-CM | POA: Insufficient documentation

## 2020-07-02 DIAGNOSIS — R0602 Shortness of breath: Secondary | ICD-10-CM

## 2020-07-02 DIAGNOSIS — F15950 Other stimulant use, unspecified with stimulant-induced psychotic disorder with delusions: Secondary | ICD-10-CM

## 2020-07-02 DIAGNOSIS — J45909 Unspecified asthma, uncomplicated: Secondary | ICD-10-CM | POA: Insufficient documentation

## 2020-07-02 DIAGNOSIS — F1510 Other stimulant abuse, uncomplicated: Secondary | ICD-10-CM

## 2020-07-02 DIAGNOSIS — R55 Syncope and collapse: Secondary | ICD-10-CM | POA: Insufficient documentation

## 2020-07-02 LAB — BASIC METABOLIC PANEL
Anion gap: 14 (ref 5–15)
BUN: 29 mg/dL — ABNORMAL HIGH (ref 6–20)
CO2: 21 mmol/L — ABNORMAL LOW (ref 22–32)
Calcium: 9.4 mg/dL (ref 8.9–10.3)
Chloride: 97 mmol/L — ABNORMAL LOW (ref 98–111)
Creatinine, Ser: 1.57 mg/dL — ABNORMAL HIGH (ref 0.61–1.24)
GFR, Estimated: 56 mL/min — ABNORMAL LOW (ref >60)
Glucose, Bld: 101 mg/dL — ABNORMAL HIGH (ref 70–99)
Potassium: 4.3 mmol/L (ref 3.5–5.1)
Sodium: 132 mmol/L — ABNORMAL LOW (ref 135–145)

## 2020-07-02 LAB — CBC WITH DIFFERENTIAL/PLATELET
Abs Immature Granulocytes: 0.05 10*3/uL (ref 0.00–0.07)
Basophils Absolute: 0.1 10*3/uL (ref 0.0–0.1)
Basophils Relative: 0 %
Eosinophils Absolute: 0 10*3/uL (ref 0.0–0.5)
Eosinophils Relative: 0 %
HCT: 36.1 % — ABNORMAL LOW (ref 39.0–52.0)
Hemoglobin: 12.3 g/dL — ABNORMAL LOW (ref 13.0–17.0)
Immature Granulocytes: 0 %
Lymphocytes Relative: 24 %
Lymphs Abs: 3.1 10*3/uL (ref 0.7–4.0)
MCH: 30.7 pg (ref 26.0–34.0)
MCHC: 34.1 g/dL (ref 30.0–36.0)
MCV: 90 fL (ref 80.0–100.0)
Monocytes Absolute: 1.2 10*3/uL — ABNORMAL HIGH (ref 0.1–1.0)
Monocytes Relative: 9 %
Neutro Abs: 8.7 10*3/uL — ABNORMAL HIGH (ref 1.7–7.7)
Neutrophils Relative %: 67 %
Platelets: 265 10*3/uL (ref 150–400)
RBC: 4.01 MIL/uL — ABNORMAL LOW (ref 4.22–5.81)
RDW: 13.4 % (ref 11.5–15.5)
WBC: 13.1 10*3/uL — ABNORMAL HIGH (ref 4.0–10.5)
nRBC: 0 % (ref 0.0–0.2)

## 2020-07-02 MED ORDER — SODIUM CHLORIDE 0.9 % IV BOLUS
1000.0000 mL | Freq: Once | INTRAVENOUS | Status: AC
  Administered 2020-07-02: 1000 mL via INTRAVENOUS

## 2020-07-02 NOTE — ED Notes (Signed)
When speaking pt unable to maintain a steady thought process, and jumbling word

## 2020-07-02 NOTE — ED Notes (Signed)
Pt removed IV swelling noted at site. Pt states he was trying to remove tape and pulled IV out. Pt insisting on taking a shower prior to allowing RN to obtain IV access for a second time.

## 2020-07-02 NOTE — ED Triage Notes (Signed)
Pt comes into the ED via [facility] from [facility].  Pt transported to [facility] today and when he arrived, he told staff he felt as though he was going to pass out so they called EMs.  Pt is coming off meth x2 days.   CBG 106, 99.6, 98HR, 142/81, 96% ra

## 2020-07-02 NOTE — ED Notes (Signed)
Hourly rounding completed at this time, patient currently asleep in room. No complaints, stable, and in no acute distress. Q15 minute rounds and monitoring via Rover and Officer to continue. 

## 2020-07-02 NOTE — ED Provider Notes (Signed)
[facility] Emergency Department Provider Note   ____________________________________________   I have reviewed the triage vital signs and the nursing notes.   HISTORY  Chief Complaint Near syncope  History limited by: Altered Mental Status   HPI David Leach is a 44 y.o. male who presents to the emergency department today with primary complaint of a near syncopal episode. He states that he felt dizzy and that he might pass out earlier in the day. Says this scared him because roughly 1 year ago he had a syncopal or seizure type episode. Was concerned that today it could have been caused by him not eating a lot and his sugars not being normal. Does have some concern he might have diabetes given family history of same. In addition he does have complaints of chronic back pain for which he says he receives care in [place]. The patient tells myself that he is not in the ED today to seek care for his back pain, but instead the near syncope. He denies any chest pain, denies any nausea, vomiting or diarrhea.    Records reviewed. Per medical record review patient has a history of back pain, has been evaluated in the emergency department in the [place] for [facility]K complaints.   Past Medical History:  Diagnosis Date  . Asthma   . Chronic dental pain   . Chronic shoulder pain   . DVT (deep venous thrombosis) (HCC)   . Headache   . Pulmonary embolism (HCC)   . Right-sided back pain     Patient Active Problem List   Diagnosis Date Noted  . Cellulitis of right knee 07/06/2019  . Tobacco abuse 07/06/2019  . 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 Foot pain, bilateral 12/27/2016  . Pleuritic chest pain 12/27/2016  . Acute renal insufficiency 12/27/2016  . Pain of foot 12/27/2016  . Asthma 11/06/2016    Past Surgical History:  Procedure Laterality Date  . TONSILLECTOMY      Prior to Admission medications   Medication Sig Start Date End Date Taking? Authorizing Provider   methocarbamol (ROBAXIN) 500 MG tablet Take 1 tablet (500 mg total) by mouth every 6 (six) hours as needed. 08/23/19   Tommi Rumps, PA-C  neomycin-bacitracin-polymyxin (NEOSPORIN) OINT Apply 1 application topically daily. 07/09/19   Hollice Espy, MD  oxyCODONE (OXY IR/ROXICODONE) 5 MG immediate release tablet Take 0.5 tablets (2.5 mg total) by mouth every 6 (six) hours as needed for severe pain. 07/09/19   Hollice Espy, MD  oxycodone (OXY-IR) 5 MG capsule Take 1 capsule (5 mg total) by mouth every 4 (four) hours as needed. 08/23/19   Tommi Rumps, PA-C  traMADol (ULTRAM) 50 MG tablet Take 1 tablet (50 mg total) by mouth every 6 (six) hours as needed. 06/24/20   Minna Antis, MD    Allergies Hydrocodone  Family History  Problem Relation Age of Onset  . Cancer Mother   . Diabetes Father   . Cancer Other   . Stroke Other   . Diabetes Other     Social History Social History   Tobacco Use  . Smoking status: Current Every Day Smoker    Packs/day: 1.00    Years: 20.00    Pack years: 20.00    Types: Cigarettes  . Smokeless tobacco: Former Neurosurgeon    Types: Chew  Substance Use Topics  . Alcohol use: Yes    Comment: occ  . Drug use: No    Review of Systems Constitutional:  No fever/chills Eyes: No visual changes. ENT: No sore throat. Cardiovascular: Denies chest pain. Respiratory: Positive for shortness of breath. Gastrointestinal: No abdominal pain.  No nausea, no vomiting.  No diarrhea.   Genitourinary: Negative for dysuria. Musculoskeletal: Positive for back pain. Skin: Negative for rash. Neurological: Negative for headaches, focal weakness or numbness.  ____________________________________________   PHYSICAL EXAM:  VITAL SIGNS: ED Triage Vitals  Enc Vitals Group     BP 07/02/20 1422 (!) 123/96     Pulse Rate 07/02/20 1422 (!) 102     Resp 07/02/20 1422 18     Temp 07/02/20 1422 99.3 F (37.4 C)     Temp Source 07/02/20 1422 Oral     SpO2  07/02/20 1422 100 %     Weight 07/02/20 1423 187 lb 6.3 oz (85 kg)     Height 07/02/20 1423 5\' 11"  (1.803 m)     Head Circumference --      Peak Flow --      Pain Score 07/02/20 1423 8   Constitutional: Alert and oriented.  Eyes: Conjunctivae are normal.  ENT      Head: Normocephalic and atraumatic.      Nose: No congestion/rhinnorhea.      Mouth/Throat: Mucous membranes are moist.      Neck: No stridor. Hematological/Lymphatic/Immunilogical: No cervical lymphadenopathy. Cardiovascular: Normal rate, regular rhythm.  No murmurs, rubs, or gallops.  Respiratory: Normal respiratory effort without tachypnea nor retractions. Breath sounds are clear and equal bilaterally. No wheezes/rales/rhonchi. Gastrointestinal: Soft and non tender. No rebound. No guarding.  Genitourinary: Deferred Musculoskeletal: Normal range of motion in all extremities. No lower extremity edema. Neurologic:  Does not appear to be completely oriented to events.  Skin:  Skin is warm, dry and intact. No rash noted. Psychiatric: Seems slightly erratic with speech.  ____________________________________________    LABS (pertinent positives/negatives)  CBC wbc 13.1, hgb 12.3, plt 265 BMP na 132, k 4.3, glu 101, cr 1.57  ____________________________________________   EKG  I, 13/03/21, attending physician, personally viewed and interpreted this EKG  EKG Time: 1430 Rate: 107 Rhythm: sinus tachycardia Axis: normal Intervals: qtc 464 QRS: Incomplete RBBB ST changes: no st elevation Impression: abnormal ekg   ____________________________________________    RADIOLOGY  CXR No acute abnormality  ____________________________________________   PROCEDURES  Procedures  ____________________________________________   INITIAL IMPRESSION / ASSESSMENT AND PLAN / ED COURSE  Pertinent labs & imaging results that were available during my care of the patient were reviewed by me and considered in my  medical decision making (see chart for details).  Patient presents to the emergency department today with primary complaint of a near syncopal episode. Blood work without concerning findings. 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 Will plan on observing in the hospital for sobriety.   ____________________________________________   FINAL CLINICAL IMPRESSION(S) / ED DIAGNOSES  Final diagnoses:  Chronic back pain, unspecified back location, unspecified back pain laterality  Near syncope     Note: This dictation was prepared with Dragon dictation. Any transcriptional errors that result from this process are unintentional     Phineas Semen, MD 07/03/20 1526

## 2020-07-02 NOTE — ED Notes (Signed)
Pt is continuously standing in doorway and opening door stating that he has to help his brother.

## 2020-07-02 NOTE — Discharge Instructions (Signed)
Please seek medical attention for any high fevers, chest pain, shortness of breath, change in behavior, persistent vomiting, bloody stool or any other new or concerning symptoms.  
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
## 2020-07-02 NOTE — ED Notes (Signed)
Hourly rounding completed at this time, patient currently awake in room. No complaints, stable, and in no acute distress. Q15 minute rounds and monitoring via Rover and Officer to continue. °

## 2020-07-02 NOTE — ED Notes (Signed)
Report received from Lorrie, RN including Situation, Background, Assessment, and Recommendations. Patient alert and oriented, warm and dry, and in no acute distress. Patient denies HI, AVH and pain. Patient made aware of Q15 minute rounds and Psychologist, counselling presence for their safety. Patient instructed to come to this nurse with needs or concerns.

## 2020-07-02 NOTE — ED Notes (Signed)
Verified plan of care with Dr. Derrill Kay, states that when pt is sober and ready to leave then he can be DC. Updated Dr. Derrill Kay that pt is not at this point currently, continues to show signs of meth usage with agitation and actions, will continue to monitor.
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
## 2020-07-02 NOTE — ED Notes (Signed)
Pt willing to use bath wipes instead of taking a shower. Pt given blue paper scrub top to temporarily replace bloody shirt and also given hospital socks. Pt items given to this tech were placed in belongings bag.
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
## 2020-07-03 DIAGNOSIS — F1510 Other stimulant abuse, uncomplicated: Secondary | ICD-10-CM

## 2020-07-03 DIAGNOSIS — F15950 Other stimulant use, unspecified with stimulant-induced psychotic disorder with delusions: Secondary | ICD-10-CM

## 2020-07-03 MED ORDER — NICOTINE 21 MG/24HR TD PT24
21.0000 mg | MEDICATED_PATCH | Freq: Once | TRANSDERMAL | Status: DC
  Administered 2020-07-03: 21 mg via TRANSDERMAL
  Filled 2020-07-03: qty 1

## 2020-07-03 MED ORDER — LORAZEPAM 2 MG PO TABS
2.0000 mg | ORAL_TABLET | Freq: Once | ORAL | Status: AC
  Administered 2020-07-03: 2 mg via ORAL
  Filled 2020-07-03: qty 1

## 2020-07-03 NOTE — Consult Note (Signed)
[facility] Face-to-Face Psychiatry Consult   Reason for Consult: Consult for this 44 year old man with a history of substance abuse who came into the hospital confused Referring Physician: Roxan Hockey Patient Identification: David Leach MRN:  [id number] Principal Diagnosis: Amphetamine and psychostimulant-induced psychosis with delusions (HCC) Diagnosis:  Principal Problem:   Amphetamine and psychostimulant-induced psychosis with delusions (HCC) Active Problems:   Amphetamine abuse (HCC)   Total Time spent with patient: 1 hour  Subjective:   David Leach is a 44 y.o. male patient admitted with "I been out for 3 days".  HPI: Patient seen chart reviewed.  Patient came to the hospital last night disorganized and confused.  Making statements as documented in the chart that sound psychotic and delusional.  Patient had been asleep for several hours when I woke him up this morning.  He was able to tell me that he had been using methamphetamine on a binge for at least the last 3 days.  During that time he was neither sleeping or eating.  He denies having any hallucinations today.  He remains a little disorganized and confused but did not make any frankly psychotic statements.  Denies any suicidal or homicidal thought.  Past Psychiatric History: Past history of substance abuse.  Does not appear to have ever made much of an effort to engage in outpatient treatment.  No history of suicidality or violence  Risk to Self:   Risk to Others:   Prior Inpatient Therapy:   Prior Outpatient Therapy:    Past Medical History:  Past Medical History:  Diagnosis Date  . Asthma   . Chronic dental pain   . Chronic shoulder pain   . DVT (deep venous thrombosis) (HCC)   . Headache   . Pulmonary embolism (HCC)   . Right-sided back pain     Past Surgical History:  Procedure Laterality Date  . TONSILLECTOMY     Family History:  Family History  Problem Relation Age of Onset  . Cancer Mother   . Diabetes  Father   . Cancer Other   . Stroke Other   . Diabetes Other    Family Psychiatric  History: None reported Social History:  Social History   Substance and Sexual Activity  Alcohol Use Yes   Comment: occ     Social History   Substance and Sexual Activity  Drug Use No    Social History   Socioeconomic History  . Marital status: Legally Separated    Spouse name: Not on file  . Number of children: Not on file  . Years of education: Not on file  . Highest education level: Not on file  Occupational History  . Not on file  Tobacco Use  . Smoking status: Current Every Day Smoker    Packs/day: 1.00    Years: 20.00    Pack years: 20.00    Types: Cigarettes  . Smokeless tobacco: Former Neurosurgeon    Types: Chew  Substance and Sexual Activity  . Alcohol use: Yes    Comment: occ  . Drug use: No  . Sexual activity: Yes  Other Topics Concern  . Not on file  Social History Narrative  . Not on file   Social Determinants of Health   Financial Resource Strain:   . Difficulty of Paying Living Expenses: Not on file  Food Insecurity:   . Worried About Programme researcher, broadcasting/film/video in the Last Year: Not on file  . Ran Out of Food in the Last Year: Not on  file  Transportation Needs:   . Freight forwarder (Medical): Not on file  . Lack of Transportation (Non-Medical): Not on file  Physical Activity:   . Days of Exercise per Week: Not on file  . Minutes of Exercise per Session: Not on file  Stress:   . Feeling of Stress : Not on file  Social Connections:   . Frequency of Communication with Friends and Family: Not on file  . Frequency of Social Gatherings with Friends and Family: Not on file  . Attends Religious Services: Not on file  . Active Member of Clubs or Organizations: Not on file  . Attends Banker Meetings: Not on file  . Marital Status: Not on file   Additional Social History:    Allergies:   Allergies  Allergen Reactions  . Hydrocodone Rash    Labs:   Results for orders placed or performed during the hospital encounter of 07/02/20 (from the past 48 hour(s))  CBC with Differential     Status: Abnormal   Collection Time: 07/02/20  3:48 PM  Result Value Ref Range   WBC 13.1 (H) 4.0 - 10.5 K/uL   RBC 4.01 (L) 4.22 - 5.81 MIL/uL   Hemoglobin 12.3 (L) 13.0 - 17.0 g/dL   HCT 08.6 (L) 39 - 52 %   MCV 90.0 80.0 - 100.0 fL   MCH 30.7 26.0 - 34.0 pg   MCHC 34.1 30.0 - 36.0 g/dL   RDW 57.8 46.9 - 62.9 %   Platelets 265 150 - 400 K/uL   nRBC 0.0 0.0 - 0.2 %   Neutrophils Relative % 67 %   Neutro Abs 8.7 (H) 1.7 - 7.7 K/uL   Lymphocytes Relative 24 %   Lymphs Abs 3.1 0.7 - 4.0 K/uL   Monocytes Relative 9 %   Monocytes Absolute 1.2 (H) 0.1 - 1.0 K/uL   Eosinophils Relative 0 %   Eosinophils Absolute 0.0 0.0 - 0.5 K/uL   Basophils Relative 0 %   Basophils Absolute 0.1 0.0 - 0.1 K/uL   Immature Granulocytes 0 %   Abs Immature Granulocytes 0.05 0.00 - 0.07 K/uL    Comment: Performed at [facility], [address] [place], [place] [address]  Basic metabolic panel     Status: Abnormal   Collection Time: 07/02/20  3:48 PM  Result Value Ref Range   Sodium 132 (L) 135 - 145 mmol/L   Potassium 4.3 3.5 - 5.1 mmol/L   Chloride 97 (L) 98 - 111 mmol/L   CO2 21 (L) 22 - 32 mmol/L   Glucose, Bld 101 (H) 70 - 99 mg/dL    Comment: Glucose reference range applies only to samples taken after fasting for at least 8 hours.   BUN 29 (H) 6 - 20 mg/dL   Creatinine, Ser 3.24 (H) 0.61 - 1.24 mg/dL   Calcium 9.4 8.9 - 40.1 mg/dL   GFR, Estimated 56 (L) >60 mL/min    Comment: (NOTE) Calculated using the CKD-EPI Creatinine Equation (2021)    Anion gap 14 5 - 15    Comment: Performed at [facility], [address] [place], [place] [address]    No current facility-administered medications for this encounter.   No current outpatient medications on file.    Musculoskeletal: Strength & Muscle Tone: within normal limits Gait &  Station: normal Patient leans: N/A  Psychiatric Specialty Exam: Physical Exam Vitals and nursing note reviewed.  Constitutional:      Appearance: He is  well-developed.  HENT:     Head: Normocephalic and atraumatic.  Eyes:     Conjunctiva/sclera: Conjunctivae normal.     Pupils: Pupils are equal, round, and reactive to light.  Cardiovascular:     Heart sounds: Normal heart sounds.  Pulmonary:     Effort: Pulmonary effort is normal.  Abdominal:     Palpations: Abdomen is soft.  Musculoskeletal:        General: Normal range of motion.     Cervical back: Normal range of motion.  Skin:    General: Skin is warm and dry.  Neurological:     General: No focal deficit present.     Mental Status: He is alert.  Psychiatric:        Attention and Perception: He is inattentive.        Mood and Affect: Mood is anxious.        Speech: Speech is slurred.        Behavior: Behavior is withdrawn.        Thought Content: Thought content does not include homicidal or suicidal ideation.        Cognition and Memory: Cognition is impaired. Memory is impaired.        Judgment: Judgment is impulsive.     Review of Systems  Constitutional: Negative.   HENT: Negative.   Eyes: Negative.   Respiratory: Negative.   Cardiovascular: Negative.   Gastrointestinal: Negative.   Musculoskeletal: Negative.   Skin: Negative.   Neurological: Negative.   Psychiatric/Behavioral: Positive for confusion and sleep disturbance.    Blood pressure 121/68, pulse 100, temperature 98 F (36.7 C), temperature source Oral, resp. rate 17, height 5\' 11"  (1.803 m), weight 85 kg, SpO2 100 %.Body mass index is 26.14 kg/m.  General Appearance: Disheveled  Eye Contact:  Minimal  Speech:  Garbled  Volume:  Decreased  Mood:  Anxious  Affect:  Constricted  Thought Process:  Disorganized  Orientation:  Full (Time, Place, and Person)  Thought Content:  Illogical and Rumination  Suicidal Thoughts:  No  Homicidal Thoughts:   No  Memory:  Immediate;   Fair Recent;   Poor Remote;   Poor  Judgement:  Impaired  Insight:  Shallow  Psychomotor Activity:  Decreased  Concentration:  Concentration: Poor  Recall:  Poor  Fund of Knowledge:  Fair  Language:  Fair  Akathisia:  No  Handed:  Right  AIMS (if indicated):     Assets:  Desire for Improvement Resilience  ADL's:  Impaired  Cognition:  Impaired,  Mild  Sleep:        Treatment Plan Summary: Daily contact with patient to assess and evaluate symptoms and progress in treatment, Medication management and Plan Patient slept it off last night is now waking up.  Tells me that what we can do for him that would be most helpful would be something to eat and letting him take a shower.  Denies any suicidal thinking.  Shows no interest and was not enthusiastic about any outpatient mental health treatment or attempts to stop using drugs.  Minimizes this problem.  Patient does not meet commitment criteria.  Case reviewed with emergency room physician and TTS.  We likely will be able to discharge him once he is stable on his feet.  Disposition: Patient does not meet criteria for psychiatric inpatient admission.  , MD 07/03/2020 12:34 PM

## 2020-07-03 NOTE — ED Provider Notes (Signed)
The patient has been evaluated at bedside by Dr. Toni Amend, psychiatry.  Patient is clinically stable.  Not felt to be a danger to self or others.  No SI or Hi.  No indication for inpatient psychiatric admission at this time.  Currently clinically sober and able to ambulate with steady gait.  Appropriate for outpatient therapy.    Willy Eddy, MD 07/03/20 [phone]

## 2020-07-03 NOTE — ED Notes (Signed)
Hourly rounding completed at this time, patient currently asleep in room. No complaints, stable, and in no acute distress. Q15 minute rounds and monitoring via Rover and Officer to continue. 

## 2020-07-03 NOTE — ED Notes (Signed)
E-signature not working at this time. Pt verbalized understanding of D/C instructions, prescriptions and follow up care with no further questions at this time. Pt in NAD and ambulatory at time of D/C. Pt belongings returned to patient at time of departure. Pt with steady gait to lobby.

## 2020-07-03 NOTE — ED Notes (Signed)
Pt awake and standing in doorway at this time. Pt requests water and is provided by this nurse. Pt requests shower and is educated again on rules of ED quad. Pt becomes agitated and verbally aggressive and demanding towards staff, states that he did a line of meth and then ran in a creek for 3 days. Pt other speech is difficult to understand and pt rapidly speaks and rambles on. Pt then asks this nurse, "are you a nurse or a doctor, you're a nurse aren't you. I need a real doctor to talk to me." This nurse informs pt that this nurse will notify Md on request. Pt again talks about shower how he is simply going to goo into bathroom and turn on water because nobody can stop him. Pt reminded again on how ED process works. While pt speaks he angrily throws arms in air and around and points at staff. MD notified at this time and orders to follow.

## 2020-07-03 NOTE — ED Notes (Signed)
Pt currently asleep at this time, unable to collect vitals. Will collect when pt wakes up.

## 2020-07-03 NOTE — ED Notes (Signed)
Pt asked for urine sample at this time as ordered by Dr. Dolores Frame. Pt becomes irritated more so, throwing hands in air, unable to understand what pt says but he has irritated and angry tone of voice. Pt then states, "I only had one cup of water, no." Urine cup left on pt sink when able to provide sample. Pt is paranoid and does not believe that pill provided by this nurse is correct and believes there is a different medication in cup and that this nurse is attempting to trick pt. Pt later takes med. Pt has acting hostile at this time and attempting to intimidate this nurse. Sits back on bed and continues to speak unintelligible words that are rapid

## 2020-07-03 NOTE — ED Notes (Signed)
This Rn attempted to obtain vital signs. Pt refusing, this RN asked if we could obtain vital signs later when patient wakes up a little more, pt states "No". Pt respirations even and unlabored, pt appears to be in no acute distress and is resting comfortably. Pt also refusing breakfast tray at this time.

## 2020-07-03 NOTE — ED Notes (Signed)
Hourly rounding completed at this time, patient currently awake in room. Pt remains agitated and sitting on bed, is speaking to self. Q15 minute rounds and monitoring via Psychologist, counselling to continue.

## 2020-07-03 NOTE — ED Provider Notes (Addendum)
-----------------------------------------   5:49 AM on 07/03/2020 -----------------------------------------   Blood pressure 130/72, pulse (!) 103, temperature 98.8 F (37.1 C), temperature source Oral, resp. rate 20, height 5\' 11"  (1.803 m), weight 85 kg, SpO2 97 %.  The patient is mildly agitated and verbally rude to nursing staff at this time.  Exhibits paranoia.  Endorses methamphetamine use.  Slept in the ED overnight off and on due to no transportation home.  Will administer oral Ativan for agitation.  Will ask psychiatry/TTS to evaluate. The patient has been placed in psychiatric observation due to the need to provide a safe environment for the patient while obtaining psychiatric consultation and evaluation, as well as ongoing medical and medication management to treat the patient's condition.  The patient has not been placed under full IVC at this time.    , MD 07/03/20 13/04/21    [id number], MD 07/03/20 [phone]

## 2020-07-03 NOTE — BH Assessment (Signed)
Assessment Note  David Leach is an 44 y.o. male Who presents to the ER due to being impaired form his substance use. Patient admits to the use of methamphetamine and other substances. He was unable to remember the details of what took place that brought him to the ER but hes aware that it was a result of his substance use. Upon arrival to the ER, patient thoughts were unorganized, and speech reflect the same.    During the interview the patient was calm, cooperative and pleasant. He was able to provide appropriate answers to the questions. Throughout the interview, he denied SI/HI. He shared he has experienced AV/H but it was when he was intoxicated or havent slept for days, as a result of his methamphetamine use. Patient also denies history of aggression and violence.   Diagnosis: Substance Induced Psychosis  Past Medical History:  Past Medical History:  Diagnosis Date   Asthma    Chronic dental pain    Chronic shoulder pain    DVT (deep venous thrombosis) (HCC)    Headache    Pulmonary embolism (HCC)    Right-sided back pain     Past Surgical History:  Procedure Laterality Date   TONSILLECTOMY      Family History:  Family History  Problem Relation Age of Onset   Cancer Mother    Diabetes Father    Cancer Other    Stroke Other    Diabetes Other     Social History:  reports that he has been smoking cigarettes. He has a 20.00 pack-year smoking history. He has quit using smokeless tobacco.  His smokeless tobacco use included chew. He reports current alcohol use. He reports that he does not use drugs.  Additional Social History:  Alcohol / Drug Use Pain Medications: See PTA Prescriptions: See PTA Over the Counter: See PTA History of alcohol / drug use?: Yes Longest period of sobriety (when/how long): Unable to quantify Substance #1 Name of Substance 1: Methamphetamine 1 - Age of First Use: Unknown 1 - Amount (size/oz): Unable to quantify 1 - Frequency: "  not that much" 1 - Duration: Unknown 1 - Last Use / Amount: " 3 days ago" Substance #2 Name of Substance 2: Cocaine 2 - Age of First Use: Unknown 2 - Amount (size/oz): Unable to quantify 2 - Frequency: Unknown 2 - Duration: Unknown 2 - Last Use / Amount: Unknown Substance #3 Name of Substance 3: THC 3 - Age of First Use: Unknown 3 - Amount (size/oz): Unable to quantify 3 - Frequency: Unknown 3 - Duration: Unknown 3 - Last Use / Amount: Unablw to quantify  CIWA: CIWA-Ar BP: 121/68 Pulse Rate: 100 COWS:    Allergies:  Allergies  Allergen Reactions   Hydrocodone Rash    Home Medications: (Not in a hospital admission)   OB/GYN Status:  No LMP for male patient.  General Assessment Data Location of Assessment: [facility] ED TTS Assessment: In system Is this a Tele or Face-to-Face Assessment?: Face-to-Face Is this an Initial Assessment or a Re-assessment for this encounter?: Initial Assessment Patient Accompanied by:: N/A Language Other than English: No Living Arrangements: Other (Comment) (Living with boss) What gender do you identify as?: Male Date Telepsych consult ordered in [facility]: 07/03/20 Time Telepsych consult ordered in [facility]: 0606 Marital status: Single Pregnancy Status: No Living Arrangements: Non-relatives/Friends Can pt return to current living arrangement?: Yes Admission Status: Voluntary Is patient capable of signing voluntary admission?: Yes Referral Source: Other Insurance type: None  Medical Screening  Exam [facility] Walk-in ONLY) Medical Exam completed: Yes  Crisis Care Plan Living Arrangements: Non-relatives/Friends Legal Guardian: Other: (Self) Name of Psychiatrist: Reports of none Name of Therapist: Reports of none  Education Status Is patient currently in school?: No Is the patient employed, unemployed or receiving disability?: Employed  Risk to self with the past 6 months Suicidal Ideation: No Has patient been a risk to self within the past 6  months prior to admission? : No Suicidal Intent: No Has patient had any suicidal intent within the past 6 months prior to admission? : No Is patient at risk for suicide?: No Suicidal Plan?: No Has patient had any suicidal plan within the past 6 months prior to admission? : No Access to Means: No What has been your use of drugs/alcohol within the last 12 months?: Methamphetamines, cocaine, THC, alcohol Previous Attempts/Gestures: No How many times?: 0 Other Self Harm Risks: Active drug use Triggers for Past Attempts: None known Intentional Self Injurious Behavior: None Family Suicide History: Unknown Recent stressful life event(s): Other (Comment) Persecutory voices/beliefs?: No Depression: No Substance abuse history and/or treatment for substance abuse?: Yes Suicide prevention information given to non-admitted patients: Not applicable  Risk to Others within the past 6 months Homicidal Ideation: No Does patient have any lifetime risk of violence toward others beyond the six months prior to admission? : No Thoughts of Harm to Others: No Current Homicidal Intent: No Current Homicidal Plan: No Access to Homicidal Means: No Identified Victim: None reported History of harm to others?: No Assessment of Violence: None Noted Violent Behavior Description: None reported Does patient have access to weapons?: No Criminal Charges Pending?: No Does patient have a court date: No Is patient on probation?: No  Psychosis Hallucinations: Auditory, Visual (When intoxicated) Delusions: None noted  Mental Status Report Appearance/Hygiene: Unremarkable Eye Contact: Good Motor Activity: Freedom of movement, Unremarkable Speech: Slurred, Logical/coherent Level of Consciousness: Alert Mood: Pleasant Affect: Appropriate to circumstance Anxiety Level: None Thought Processes: Coherent, Relevant Judgement: Unimpaired Orientation: Person, Place, Time, Situation, Appropriate for developmental  age Obsessive Compulsive Thoughts/Behaviors: None  Cognitive Functioning Concentration: Normal Memory: Remote Intact, Recent Impaired Is patient IDD: No Insight: Fair Impulse Control: Fair Appetite: Good Have you had any weight changes? : No Change Sleep: Decreased Vegetative Symptoms: None  ADLScreening [facility] Assessment Services) Patient's cognitive ability adequate to safely complete daily activities?: Yes Patient able to express need for assistance with ADLs?: Yes Independently performs ADLs?: Yes (appropriate for developmental age)  Prior Inpatient Therapy Prior Inpatient Therapy: No  Prior Outpatient Therapy Prior Outpatient Therapy: No Does patient have an ACCT team?: No Does patient have Intensive In-House Services?  : No Does patient have [facility] services? : No Does patient have P4CC services?: No  ADL Screening (condition at time of admission) Patient's cognitive ability adequate to safely complete daily activities?: Yes Is the patient deaf or have difficulty hearing?: No Does the patient have difficulty seeing, even when wearing glasses/contacts?: No Does the patient have difficulty concentrating, remembering, or making decisions?: No Patient able to express need for assistance with ADLs?: Yes Does the patient have difficulty dressing or bathing?: No Independently performs ADLs?: Yes (appropriate for developmental age) Does the patient have difficulty walking or climbing stairs?: No Weakness of Legs: None Weakness of Arms/Hands: None  Home Assistive Devices/Equipment Home Assistive Devices/Equipment: None  Therapy Consults (therapy consults require a physician order) PT Evaluation Needed: No OT Evalulation Needed: No SLP Evaluation Needed: No Abuse/Neglect Assessment (Assessment to be complete while patient  is alone) Abuse/Neglect Assessment Can Be Completed: Yes Physical Abuse: Denies Verbal Abuse: Denies Sexual Abuse: Denies Exploitation of  patient/patient's resources: Denies Self-Neglect: Denies Values / Beliefs Cultural Requests During Hospitalization: None Spiritual Requests During Hospitalization: None Consults Spiritual Care Consult Needed: No Transition of Care Team Consult Needed: No Advance Directives (For Healthcare) Does Patient Have a Medical Advance Directive?: No  Disposition:  Disposition Initial Assessment Completed for this Encounter: Yes  On Site Evaluation by:   Reviewed with Physician:    Lilyan Gilford MS, LCAS, [facility], [facility] Therapeutic Triage Specialist 07/03/2020 2:10 PM

## 2020-09-18 ENCOUNTER — Emergency Department: Payer: Self-pay

## 2020-09-18 ENCOUNTER — Other Ambulatory Visit: Payer: Self-pay

## 2020-09-18 ENCOUNTER — Inpatient Hospital Stay
Admission: EM | Admit: 2020-09-18 | Discharge: 2020-09-20 | DRG: 897 | Disposition: A | Discharge status: Home / Self Care | Payer: Self-pay | Attending: Internal Medicine | Admitting: Internal Medicine

## 2020-09-18 DIAGNOSIS — M6282 Rhabdomyolysis: Secondary | ICD-10-CM | POA: Diagnosis present

## 2020-09-18 DIAGNOSIS — M545 Low back pain, unspecified: Secondary | ICD-10-CM | POA: Diagnosis present

## 2020-09-18 DIAGNOSIS — R519 Headache, unspecified: Secondary | ICD-10-CM | POA: Diagnosis present

## 2020-09-18 DIAGNOSIS — K0889 Other specified disorders of teeth and supporting structures: Secondary | ICD-10-CM | POA: Diagnosis present

## 2020-09-18 DIAGNOSIS — J45909 Unspecified asthma, uncomplicated: Secondary | ICD-10-CM | POA: Diagnosis present

## 2020-09-18 DIAGNOSIS — D649 Anemia, unspecified: Secondary | ICD-10-CM | POA: Diagnosis present

## 2020-09-18 DIAGNOSIS — Z86718 Personal history of other venous thrombosis and embolism: Secondary | ICD-10-CM

## 2020-09-18 DIAGNOSIS — F15950 Other stimulant use, unspecified with stimulant-induced psychotic disorder with delusions: Secondary | ICD-10-CM | POA: Diagnosis present

## 2020-09-18 DIAGNOSIS — R451 Restlessness and agitation: Secondary | ICD-10-CM | POA: Diagnosis present

## 2020-09-18 DIAGNOSIS — F15150 Other stimulant abuse with stimulant-induced psychotic disorder with delusions: Principal | ICD-10-CM | POA: Diagnosis present

## 2020-09-18 DIAGNOSIS — F600 Paranoid personality disorder: Secondary | ICD-10-CM | POA: Diagnosis present

## 2020-09-18 DIAGNOSIS — Z823 Family history of stroke: Secondary | ICD-10-CM

## 2020-09-18 DIAGNOSIS — G8929 Other chronic pain: Secondary | ICD-10-CM | POA: Diagnosis present

## 2020-09-18 DIAGNOSIS — R410 Disorientation, unspecified: Secondary | ICD-10-CM

## 2020-09-18 DIAGNOSIS — Z833 Family history of diabetes mellitus: Secondary | ICD-10-CM

## 2020-09-18 DIAGNOSIS — F05 Delirium due to known physiological condition: Secondary | ICD-10-CM | POA: Diagnosis present

## 2020-09-18 DIAGNOSIS — Z885 Allergy status to narcotic agent status: Secondary | ICD-10-CM

## 2020-09-18 DIAGNOSIS — F17210 Nicotine dependence, cigarettes, uncomplicated: Secondary | ICD-10-CM | POA: Diagnosis present

## 2020-09-18 DIAGNOSIS — Z20822 Contact with and (suspected) exposure to covid-19: Secondary | ICD-10-CM | POA: Diagnosis present

## 2020-09-18 DIAGNOSIS — Z86711 Personal history of pulmonary embolism: Secondary | ICD-10-CM

## 2020-09-18 DIAGNOSIS — R253 Fasciculation: Secondary | ICD-10-CM | POA: Diagnosis present

## 2020-09-18 DIAGNOSIS — R7401 Elevation of levels of liver transaminase levels: Secondary | ICD-10-CM | POA: Diagnosis present

## 2020-09-18 DIAGNOSIS — F1510 Other stimulant abuse, uncomplicated: Secondary | ICD-10-CM | POA: Diagnosis present

## 2020-09-18 LAB — PROCALCITONIN: Procalcitonin: 0.53 ng/mL

## 2020-09-18 LAB — URINALYSIS, COMPLETE (UACMP) WITH MICROSCOPIC
Bacteria, UA: NONE SEEN
Bilirubin Urine: NEGATIVE
Glucose, UA: NEGATIVE mg/dL
Hgb urine dipstick: NEGATIVE
Ketones, ur: 20 mg/dL — AB
Leukocytes,Ua: NEGATIVE
Nitrite: NEGATIVE
Protein, ur: NEGATIVE mg/dL
Specific Gravity, Urine: 1.018 (ref 1.005–1.030)
Squamous Epithelial / HPF: NONE SEEN (ref 0–5)
WBC, UA: NONE SEEN WBC/hpf (ref 0–5)
pH: 6 (ref 5.0–8.0)

## 2020-09-18 LAB — COMPREHENSIVE METABOLIC PANEL
ALT: 91 U/L — ABNORMAL HIGH (ref 0–44)
AST: 194 U/L — ABNORMAL HIGH (ref 15–41)
Albumin: 4.1 g/dL (ref 3.5–5.0)
Alkaline Phosphatase: 84 U/L (ref 38–126)
Anion gap: 13 (ref 5–15)
BUN: 37 mg/dL — ABNORMAL HIGH (ref 6–20)
CO2: 25 mmol/L (ref 22–32)
Calcium: 9.2 mg/dL (ref 8.9–10.3)
Chloride: 98 mmol/L (ref 98–111)
Creatinine, Ser: 1.11 mg/dL (ref 0.61–1.24)
GFR, Estimated: >60 mL/min (ref >60)
Glucose, Bld: 98 mg/dL (ref 70–99)
Potassium: 4.3 mmol/L (ref 3.5–5.1)
Sodium: 136 mmol/L (ref 135–145)
Total Bilirubin: 1.5 mg/dL — ABNORMAL HIGH (ref 0.3–1.2)
Total Protein: 7.8 g/dL (ref 6.5–8.1)

## 2020-09-18 LAB — RESP PANEL BY RT-PCR (FLU A&B, COVID) ARPGX2
Influenza A by PCR: NEGATIVE
Influenza B by PCR: NEGATIVE
SARS Coronavirus 2 by RT PCR: NEGATIVE

## 2020-09-18 LAB — COOXEMETRY PANEL
Carboxyhemoglobin: 1.5 % (ref 0.5–1.5)
Methemoglobin: 0.2 % (ref 0.0–1.5)
O2 Saturation: 82.1 %
Total oxygen content: 77.1 mL/dL

## 2020-09-18 LAB — URINE DRUG SCREEN, QUALITATIVE (ARMC ONLY)
Amphetamines, Ur Screen: POSITIVE — AB
Barbiturates, Ur Screen: NOT DETECTED
Benzodiazepine, Ur Scrn: NOT DETECTED
Cannabinoid 50 Ng, Ur ~~LOC~~: NOT DETECTED
Cocaine Metabolite,Ur ~~LOC~~: POSITIVE — AB
MDMA (Ecstasy)Ur Screen: NOT DETECTED
Methadone Scn, Ur: NOT DETECTED
Opiate, Ur Screen: NOT DETECTED
Phencyclidine (PCP) Ur S: NOT DETECTED
Tricyclic, Ur Screen: NOT DETECTED

## 2020-09-18 LAB — CBC
HCT: 34.6 % — ABNORMAL LOW (ref 39.0–52.0)
Hemoglobin: 11.5 g/dL — ABNORMAL LOW (ref 13.0–17.0)
MCH: 30.3 pg (ref 26.0–34.0)
MCHC: 33.2 g/dL (ref 30.0–36.0)
MCV: 91.1 fL (ref 80.0–100.0)
Platelets: 273 10*3/uL (ref 150–400)
RBC: 3.8 MIL/uL — ABNORMAL LOW (ref 4.22–5.81)
RDW: 13.5 % (ref 11.5–15.5)
WBC: 11.8 10*3/uL — ABNORMAL HIGH (ref 4.0–10.5)
nRBC: 0 % (ref 0.0–0.2)

## 2020-09-18 LAB — PROTIME-INR
INR: 1.1 (ref 0.8–1.2)
Prothrombin Time: 13.3 seconds (ref 11.4–15.2)

## 2020-09-18 LAB — LACTIC ACID, PLASMA: Lactic Acid, Venous: 0.9 mmol/L (ref 0.5–1.9)

## 2020-09-18 LAB — AMMONIA: Ammonia: 25 umol/L (ref 9–35)

## 2020-09-18 LAB — ETHANOL: Alcohol, Ethyl (B): <10 mg/dL (ref <10)

## 2020-09-18 LAB — CK: Total CK: 4459 U/L — ABNORMAL HIGH (ref 49–397)

## 2020-09-18 LAB — APTT: aPTT: 34 seconds (ref 24–36)

## 2020-09-18 MED ORDER — LORAZEPAM 1 MG PO TABS
1.0000 mg | ORAL_TABLET | ORAL | Status: DC | PRN
Start: 2020-09-18 — End: 2020-09-20

## 2020-09-18 MED ORDER — ONDANSETRON HCL 4 MG PO TABS: 4.0000 mg | ORAL_TABLET | Freq: Four times a day (QID) | ORAL | Status: DC | PRN

## 2020-09-18 MED ORDER — ACETAMINOPHEN 650 MG RE SUPP: 650.0000 mg | Freq: Four times a day (QID) | RECTAL | Status: DC | PRN

## 2020-09-18 MED ORDER — LORAZEPAM 2 MG/ML IJ SOLN
2.0000 mg | Freq: Once | INTRAMUSCULAR | Status: AC
  Administered 2020-09-18: 2 mg via INTRAVENOUS
  Filled 2020-09-18: qty 1

## 2020-09-18 MED ORDER — ONDANSETRON HCL 4 MG/2ML IJ SOLN: 4.0000 mg | Freq: Four times a day (QID) | INTRAMUSCULAR | Status: DC | PRN

## 2020-09-18 MED ORDER — IOHEXOL 350 MG/ML SOLN
75.0000 mL | Freq: Once | INTRAVENOUS | Status: AC | PRN
  Administered 2020-09-18: 75 mL via INTRAVENOUS

## 2020-09-18 MED ORDER — SODIUM CHLORIDE 0.9% FLUSH
3.0000 mL | Freq: Two times a day (BID) | INTRAVENOUS | Status: DC
  Administered 2020-09-18 – 2020-09-19 (×3): 3 mL via INTRAVENOUS

## 2020-09-18 MED ORDER — LORAZEPAM 2 MG/ML IJ SOLN
1.0000 mg | INTRAMUSCULAR | Status: DC | PRN
  Administered 2020-09-18: 4 mg via INTRAVENOUS
  Administered 2020-09-19: 2 mg via INTRAVENOUS
  Filled 2020-09-18: qty 2
  Filled 2020-09-18: qty 1

## 2020-09-18 MED ORDER — HALOPERIDOL LACTATE 5 MG/ML IJ SOLN
5.0000 mg | Freq: Once | INTRAMUSCULAR | Status: AC
  Administered 2020-09-18: 5 mg via INTRAVENOUS
  Filled 2020-09-18: qty 1

## 2020-09-18 MED ORDER — SODIUM CHLORIDE 0.9 % IV SOLN: INTRAVENOUS | Status: AC

## 2020-09-18 MED ORDER — THIAMINE HCL 100 MG/ML IJ SOLN
100.0000 mg | Freq: Every day | INTRAMUSCULAR | Status: DC
  Administered 2020-09-18 – 2020-09-19 (×2): 100 mg via INTRAVENOUS
  Filled 2020-09-18 (×2): qty 2

## 2020-09-18 MED ORDER — THIAMINE HCL 100 MG PO TABS: 100.0000 mg | ORAL_TABLET | Freq: Every day | ORAL | Status: DC

## 2020-09-18 MED ORDER — ADULT MULTIVITAMIN W/MINERALS CH: 1.0000 | ORAL_TABLET | Freq: Every day | ORAL | Status: DC

## 2020-09-18 MED ORDER — ENOXAPARIN SODIUM 40 MG/0.4ML ~~LOC~~ SOLN
40.0000 mg | SUBCUTANEOUS | Status: DC
  Administered 2020-09-18 – 2020-09-19 (×2): 40 mg via SUBCUTANEOUS
  Filled 2020-09-18: qty 0.4

## 2020-09-18 MED ORDER — LORAZEPAM 2 MG/ML IJ SOLN
INTRAMUSCULAR | Status: AC
  Filled 2020-09-18: qty 1

## 2020-09-18 MED ORDER — LORAZEPAM 2 MG/ML IJ SOLN: 2.0000 mg | INTRAMUSCULAR | Status: DC | PRN

## 2020-09-18 MED ORDER — LACTATED RINGERS IV BOLUS
30.0000 mL/kg | Freq: Once | INTRAVENOUS | Status: AC
  Administered 2020-09-18: 2244 mL via INTRAVENOUS

## 2020-09-18 MED ORDER — ACETAMINOPHEN 325 MG PO TABS: 650.0000 mg | ORAL_TABLET | Freq: Four times a day (QID) | ORAL | Status: DC | PRN

## 2020-09-18 MED ORDER — FOLIC ACID 1 MG PO TABS: 1.0000 mg | ORAL_TABLET | Freq: Every day | ORAL | Status: DC

## 2020-09-18 NOTE — Consult Note (Signed)
[place] [facility] Consult   Reason for Consult: Consult for patient with amphetamine abuse comes in paranoid agitated Referring Physician: Tamala Julian Patient Identification: David Leach MRN:  [id number] Principal Diagnosis: Amphetamine and psychostimulant-induced psychosis with delusions ([place]) Diagnosis:  Principal Problem:   Amphetamine and psychostimulant-induced psychosis with delusions ([place]) Active Problems:   Amphetamine abuse ([place])   Total Time spent with patient: 30 minutes  Subjective:   David Leach is a 45 y.o. male patient admitted with patient nonverbal.  HPI: Brought in by police after being found agitated confused paranoid.  By the time I found him he had been given some sedating medication.  Nonverbal.  Twitching and tremoring all over.  Past Psychiatric History: History of recurrent amphetamine abuse with psychosis  Risk to Self:   Risk to Others:   Prior Inpatient Therapy:   Prior Outpatient Therapy:    Past Medical History:  Past Medical History:  Diagnosis Date  . Asthma   . Chronic dental pain   . Chronic shoulder pain   . DVT (deep venous thrombosis) ([place])   . Headache   . Pulmonary embolism ([place])   . Right-sided back pain     Past Surgical History:  Procedure Laterality Date  . TONSILLECTOMY     Family History:  Family History  Problem Relation Age of Onset  . Cancer Mother   . Diabetes Father   . Cancer Other   . Stroke Other   . Diabetes Other    Family Psychiatric  History: See previous Social History:  Social History   Substance and Sexual Activity  Alcohol Use Yes   Comment: occ     Social History   Substance and Sexual Activity  Drug Use No    Social History   Socioeconomic History  . Marital status: Legally Separated    Spouse name: Not on file  . Number of children: Not on file  . Years of education: Not on file  . Highest education level: Not on file  Occupational History  . Not on file  Tobacco Use   . Smoking status: Current Every Day Smoker    Packs/day: 1.00    Years: 20.00    Pack years: 20.00    Types: Cigarettes  . Smokeless tobacco: Former Systems developer    Types: Chew  Substance and Sexual Activity  . Alcohol use: Yes    Comment: occ  . Drug use: No  . Sexual activity: Yes  Other Topics Concern  . Not on file  Social History Narrative  . Not on file   Social Determinants of Health   Financial Resource Strain: Not on file  Food Insecurity: Not on file  Transportation Needs: Not on file  Physical Activity: Not on file  Stress: Not on file  Social Connections: Not on file   Additional Social History:    Allergies:   Allergies  Allergen Reactions  . Hydrocodone Rash    Labs:  Results for orders placed or performed during the hospital encounter of 09/18/20 (from the past 48 hour(s))  Lactic acid, plasma     Status: None   Collection Time: 09/18/20  1:44 PM  Result Value Ref Range   Lactic Acid, Venous 0.9 0.5 - 1.9 mmol/L    Comment: Performed at [facility], [address] [place], [place] [address]  Comprehensive metabolic panel     Status: Abnormal   Collection Time: 09/18/20  1:45 PM  Result Value Ref Range   Sodium 136  135 - 145 mmol/L   Potassium 4.3 3.5 - 5.1 mmol/L   Chloride 98 98 - 111 mmol/L   CO2 25 22 - 32 mmol/L   Glucose, Bld 98 70 - 99 mg/dL    Comment: Glucose reference range applies only to samples taken after fasting for at least 8 hours.   BUN 37 (H) 6 - 20 mg/dL   Creatinine, Ser 1.11 0.61 - 1.24 mg/dL   Calcium 9.2 8.9 - 10.3 mg/dL   Total Protein 7.8 6.5 - 8.1 g/dL   Albumin 4.1 3.5 - 5.0 g/dL   AST 194 (H) 15 - 41 U/L   ALT 91 (H) 0 - 44 U/L   Alkaline Phosphatase 84 38 - 126 U/L   Total Bilirubin 1.5 (H) 0.3 - 1.2 mg/dL   GFR, Estimated >60 >60 mL/min    Comment: (NOTE) Calculated using the CKD-EPI Creatinine Equation (2021)    Anion gap 13 5 - 15    Comment: Performed at [facility], [address]  [place], [place] [address]  Ethanol     Status: None   Collection Time: 09/18/20  1:45 PM  Result Value Ref Range   Alcohol, Ethyl (B) <10 <10 mg/dL    Comment: (NOTE) Lowest detectable limit for serum alcohol is 10 mg/dL.  For medical purposes only. Performed at [facility], [address] [place], [place] [address]   cbc     Status: Abnormal   Collection Time: 09/18/20  1:45 PM  Result Value Ref Range   WBC 11.8 (H) 4.0 - 10.5 K/uL   RBC 3.80 (L) 4.22 - 5.81 MIL/uL   Hemoglobin 11.5 (L) 13.0 - 17.0 g/dL   HCT 34.6 (L) 39.0 - 52.0 %   MCV 91.1 80.0 - 100.0 fL   MCH 30.3 26.0 - 34.0 pg   MCHC 33.2 30.0 - 36.0 g/dL   RDW 13.5 11.5 - 15.5 %   Platelets 273 150 - 400 K/uL   nRBC 0.0 0.0 - 0.2 %    Comment: Performed at [facility], [address] [place], [place] [address]  Protime-INR     Status: None   Collection Time: 09/18/20  2:38 PM  Result Value Ref Range   Prothrombin Time 13.3 11.4 - 15.2 seconds   INR 1.1 0.8 - 1.2    Comment: (NOTE) INR goal varies based on device and disease states. Performed at [facility], [address] [place], [place] [address]   APTT     Status: None   Collection Time: 09/18/20  2:38 PM  Result Value Ref Range   aPTT 34 24 - 36 seconds    Comment: Performed at [facility], [address] [place], [place] [address]  CK     Status: Abnormal   Collection Time: 09/18/20  2:38 PM  Result Value Ref Range   Total CK 4,459 (H) 49 - 397 U/L    Comment: RESULT CONFIRMED BY MANUAL DILUTION KLW Performed at [facility], [address] [place], [place] [address]   Ammonia     Status: None   Collection Time: 09/18/20  2:39 PM  Result Value Ref Range   Ammonia 25 9 - 35 umol/L    Comment: Performed at [facility], [address] [place], [place] [address]  Urinalysis, Complete w Microscopic     Status: Abnormal   Collection Time: 09/18/20  3:26 PM  Result Value Ref Range   Color,  Urine  STRAW (A) YELLOW   APPearance CLEAR (A) CLEAR   Specific Gravity, Urine 1.018 1.005 - 1.030   pH 6.0 5.0 - 8.0   Glucose, UA NEGATIVE NEGATIVE mg/dL   Hgb urine dipstick NEGATIVE NEGATIVE   Bilirubin Urine NEGATIVE NEGATIVE   Ketones, ur 20 (A) NEGATIVE mg/dL   Protein, ur NEGATIVE NEGATIVE mg/dL   Nitrite NEGATIVE NEGATIVE   Leukocytes,Ua NEGATIVE NEGATIVE   WBC, UA NONE SEEN 0 - 5 WBC/hpf   Bacteria, UA NONE SEEN NONE SEEN   Squamous Epithelial / LPF NONE SEEN 0 - 5    Comment: Performed at [facility], [address], [place], [place] [address]  Cooxemetry Panel, hospital-performed (carboxy, met, total hgb, O2 sat)     Status: None   Collection Time: 09/18/20  3:26 PM  Result Value Ref Range   O2 Saturation 82.1 %   Carboxyhemoglobin 1.5 0.5 - 1.5 %   Methemoglobin 0.2 0.0 - 1.5 %   Total oxygen content 77.1 mL/dL    Comment: Performed at [facility], [address] [place], [place] [address]  Resp Panel by RT-PCR (Flu A&B, Covid) Nasopharyngeal Swab     Status: None   Collection Time: 09/18/20  4:05 PM   Specimen: Nasopharyngeal Swab; Nasopharyngeal(NP) swabs in vial transport medium  Result Value Ref Range   SARS Coronavirus 2 by RT PCR NEGATIVE NEGATIVE    Comment: (NOTE) SARS-CoV-2 target nucleic acids are NOT DETECTED.  The SARS-CoV-2 RNA is generally detectable in upper respiratory specimens during the acute phase of infection. The lowest concentration of SARS-CoV-2 viral copies this assay can detect is 138 copies/mL. A negative result does not preclude SARS-Cov-2 infection and should not be used as the sole basis for treatment or other patient management decisions. A negative result may occur with  improper specimen collection/handling, submission of specimen other than nasopharyngeal swab, presence of viral mutation(s) within the areas targeted by this assay, and inadequate number of viral copies(<138 copies/mL). A negative result must  be combined with clinical observations, patient history, and epidemiological information. The expected result is Negative.  Fact Sheet for Patients:  EntrepreneurPulse.com.au  Fact Sheet for Healthcare Providers:  IncredibleEmployment.be  This test is no t yet approved or cleared by the [place] FDA and  has been authorized for detection and/or diagnosis of SARS-CoV-2 by FDA under an Emergency Use Authorization (EUA). This EUA will remain  in effect (meaning this test can be used) for the duration of the COVID-19 declaration under Section 564(b)(1) of the Act, 21 U.S.C.section 360bbb-3(b)(1), unless the authorization is terminated  or revoked sooner.       Influenza A by PCR NEGATIVE NEGATIVE   Influenza B by PCR NEGATIVE NEGATIVE    Comment: (NOTE) The Xpert Xpress SARS-CoV-2/FLU/RSV plus assay is intended as an aid in the diagnosis of influenza from Nasopharyngeal swab specimens and should not be used as a sole basis for treatment. Nasal washings and aspirates are unacceptable for Xpert Xpress SARS-CoV-2/FLU/RSV testing.  Fact Sheet for Patients: EntrepreneurPulse.com.au  Fact Sheet for Healthcare Providers: IncredibleEmployment.be  This test is not yet approved or cleared by the [place] FDA and has been authorized for detection and/or diagnosis of SARS-CoV-2 by FDA under an Emergency Use Authorization (EUA). This EUA will remain in effect (meaning this test can be used) for the duration of the COVID-19 declaration under Section 564(b)(1) of the Act, 21 U.S.C. section 360bbb-3(b)(1), unless the authorization is terminated or revoked.  Performed at [facility], [place], [place] [address]   Procalcitonin - Baseline     Status: None   Collection Time: 09/18/20  5:03 PM  Result Value Ref Range   Procalcitonin 0.53 ng/mL    Comment:        Interpretation: PCT >  0.5 ng/mL and <= 2 ng/mL: Systemic infection (sepsis) is possible, but other conditions are known to elevate PCT as well. (NOTE)       Sepsis PCT Algorithm           Lower Respiratory Tract                                      Infection PCT Algorithm    ----------------------------     ----------------------------         PCT < 0.25 ng/mL                PCT < 0.10 ng/mL          Strongly encourage             Strongly discourage   discontinuation of antibiotics    initiation of antibiotics    ----------------------------     -----------------------------       PCT 0.25 - 0.50 ng/mL            PCT 0.10 - 0.25 ng/mL               OR       >80% decrease in PCT            Discourage initiation of                                            antibiotics      Encourage discontinuation           of antibiotics    ----------------------------     -----------------------------         PCT >= 0.50 ng/mL              PCT 0.26 - 0.50 ng/mL                AND       <80% decrease in PCT             Encourage initiation of                                             antibiotics       Encourage continuation           of antibiotics    ----------------------------     -----------------------------        PCT >= 0.50 ng/mL                  PCT > 0.50 ng/mL               AND         increase in PCT                  Strongly encourage  initiation of antibiotics    Strongly encourage escalation           of antibiotics                                     -----------------------------                                           PCT <= 0.25 ng/mL                                                 OR                                        > 80% decrease in PCT                                      Discontinue / Do not initiate                                             antibiotics  Performed at [facility], [address] [place], [place] [address]      Current Facility-Administered Medications  Medication Dose Route Frequency Provider Last Rate Last Admin  . LORazepam (ATIVAN) injection 2 mg  2 mg Intravenous Q1H PRN Vladimir Crofts, MD       No current outpatient medications on file.    Musculoskeletal: Strength & Muscle Tone: decreased Gait & Station: unable to stand Patient leans: N/A  Psychiatric Specialty Exam: Physical Exam Vitals and nursing note reviewed.  Constitutional:      Appearance: He is well-developed and well-nourished.  HENT:     Head: Normocephalic and atraumatic.  Eyes:     Conjunctiva/sclera: Conjunctivae normal.     Pupils: Pupils are equal, round, and reactive to light.  Cardiovascular:     Heart sounds: Normal heart sounds.  Pulmonary:     Effort: Pulmonary effort is normal.  Abdominal:     Palpations: Abdomen is soft.  Musculoskeletal:        General: Normal range of motion.     Cervical back: Normal range of motion.  Skin:    General: Skin is warm and dry.  Neurological:     Mental Status: He is alert.     Comments: Tremulous and twitching all over  Psychiatric:        Attention and Perception: He is inattentive.     Review of Systems  Unable to perform ROS: Mental status change    Blood pressure 132/76, pulse 91, temperature 98 F (36.7 C), temperature source Oral, resp. rate 20, height 5' 10"  (1.778 m), weight 74.8 kg, SpO2 100 %.Body mass index is 23.68 kg/m.  General Appearance: Disheveled  Eye Contact:  None  Speech:  Negative  Volume:  Decreased  Mood:  Negative  Affect:  Negative  Thought Process:  NA  Orientation:  Negative  Thought Content:  Negative  Suicidal Thoughts:  No  Homicidal Thoughts:  No  Memory:  Negative  Judgement:  Negative  Insight:  Negative  Psychomotor Activity:  Negative  Concentration:  Concentration: Negative  Recall:  Negative  Fund of Knowledge:  Negative  Language:  Negative  Akathisia:  Negative  Handed:  Right  AIMS (if indicated):      Assets:  Resilience  ADL's:  Impaired  Cognition:  Impaired,  Moderate and Severe  Sleep:        Treatment Plan Summary: Plan Patient very consistent with abuse of amphetamines.  Has been given Haldol and Ativan.  Vitals are being monitored.  Labs drawn.  Monitor for any changes in behavior and treat if needed for sedation.  Try and reassess once he is awake.  Unlikely to need commitment or inpatient hospitalization.  Disposition: Patient does not meet criteria for psychiatric inpatient admission.  Alethia Berthold, MD 09/18/2020 5:59 PM

## 2020-09-18 NOTE — ED Provider Notes (Addendum)
I seen care of this patient approximately 1500 from outgoing provider Dr. Tamala Julian.  Please see providers note for full details regarding patient's initial evaluation assessment.  In brief patient presents for assessment of altered mental status.  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  Patient appears delirious on presentation is not participate in her exam.  His right pupil is larger than the left but they are both reactive.  Otherwise seems to be moving his extremities spontaneously but is somewhat agitated pulling at things in the air and thrashing in his bed.  He was given below noted sedation on arrival safely complete medical evaluation.  Plan this time CT head with contrast to assess for any evidence of aneurysm or other clear acute intra cranial abnormality.  Chest x-ray has no evidence of pneumonia pneumothorax or other acute intra thoracic abnormality.  Lactic acid 0.9.  CMP with mild transaminitis and otherwise unremarkable.  No significant electrolyte or metabolic derangements.  Serum ethanol detectable.  CBC shows mild leukocytosis of 11.8 with no other significant derangements.  Hemoglobin is at baseline 11.5 compared to 12.32 months ago.  CK is elevated at 4000 quadrant 59.  Ammonia is 25 and overall low suspicion for acute hepatic encephalopathy.  UA has some ketones consistent with dehydration but no evidence of infection or blood.  Given some SIRS criteria met on arrival blood and urine cultures were obtained.  However I suspect patient's presentation is more consistent with some dehydration and rhabdo secondary to toxic ingestion.  Patient does have a history on review of records of polysubstance abuse including methamphetamine.  Lower suspicion for traumatic injury or acute infectious process at this time.  Unclear etiology for patient's enlarged right pupil.  I will plan  to admit to hospital service for further evaluation management.  Marland Kitchen1-3 Lead EKG Interpretation Performed by: Lucrezia Starch, MD Authorized by: Lucrezia Starch, MD     Interpretation: normal     ECG rate assessment: normal     Rhythm: sinus rhythm     Ectopy: none     Conduction: normal       Medications  LORazepam (ATIVAN) injection 2 mg (has no administration in time range)  LORazepam (ATIVAN) injection 2 mg (2 mg Intravenous Given 09/18/20 1458)  iohexol (OMNIPAQUE) 350 MG/ML injection 75 mL (75 mLs Intravenous Contrast Given 09/18/20 1536)  LORazepam (ATIVAN) injection 2 mg (2 mg Intravenous Given 09/18/20 1521)  haloperidol lactate (HALDOL) injection 5 mg (5 mg Intravenous Given 09/18/20 1521)  lactated ringers bolus 2,244 mL (2,244 mLs Intravenous New Bag/Given 09/18/20 1711)      Lucrezia Starch, MD 09/18/20 1817    Lucrezia Starch, MD 09/18/20 [phone]

## 2020-09-18 NOTE — ED Provider Notes (Signed)
[facility] Emergency Department Provider Note ____________________________________________   Event Date/Time   First MD Initiated Contact with Patient 09/18/20 1412     (approximate)  I have reviewed the triage vital signs and the nursing notes.  HISTORY  Chief Complaint Medical Clearance   HPI David Leach is a 45 y.o. malewho presents to the ED for evaluation of his mental health.  Chart review indicates chronic lower back pain.  History of DVT/PE remotely in 2018, unprovoked.  Patient brought into the ED via EMS where they found him wandering in the rain, turning door handles and apparently tried to break into homes.  No reported forceful injury.  No one was with him and no history is provided.  Patient unable to provide any relevant history due to his agitation and disorientation.  Past Medical History:  Diagnosis Date  . Asthma   . Chronic dental pain   . Chronic shoulder pain   . DVT (deep venous thrombosis) ([facility])   . Headache   . Pulmonary embolism ([facility])   . Right-sided back pain     Patient Active Problem List   Diagnosis Date Noted  . Amphetamine and psychostimulant-induced psychosis with delusions ([facility]) 07/03/2020  . 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 Cellulitis of right knee 07/06/2019  . Tobacco abuse 07/06/2019  . 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 Foot pain, bilateral 12/27/2016  . Pleuritic chest pain 12/27/2016  . Acute renal insufficiency 12/27/2016  . Pain of foot 12/27/2016  . Asthma 11/06/2016    Past Surgical History:  Procedure Laterality Date  . TONSILLECTOMY      Prior to Admission medications   Not on File    Allergies Hydrocodone  Family History  Problem Relation Age of Onset  . Cancer Mother   . Diabetes Father   . Cancer Other   . Stroke Other   . Diabetes Other     Social History Social History   Tobacco Use  . Smoking status: Current Every Day Smoker    Packs/day: 1.00    Years:  20.00    Pack years: 20.00    Types: Cigarettes  . Smokeless tobacco: Former Neurosurgeon    Types: Chew  Substance Use Topics  . Alcohol use: Yes    Comment: occ  . Drug use: No    Review of Systems  Unable to be accurately assessed due to his agitation and disorientation ____________________________________________   PHYSICAL EXAM:  VITAL SIGNS: Vitals:   09/18/20 1340 09/18/20 1459  BP:  (!) 142/82  Pulse: 96 88  Resp: 20 (!) 24  Temp: 98 F (36.7 C)   SpO2: 99% 100%     Constitutional: Alert and tremulous and fidgeting.  Ambulates independently from the bathroom.  Filthy bilateral hands.  Mumbling speech.  Seems to follow simple commands in all 4 extremities without apparent deficit. Eyes: Conjunctivae are normal.  Right pupil is blown, not circular, extending inferiorly and nonreactive.  Left pupil is reactive and midrange. Head: Atraumatic. Nose: No congestion/rhinnorhea. Mouth/Throat: Mucous membranes are dry and edentulous.  Oropharynx non-erythematous. Neck: No stridor. No cervical spine tenderness to palpation. Cardiovascular: Normal rate, regular rhythm. Grossly normal heart sounds.  Good peripheral circulation. Respiratory: Normal respiratory effort.  No retractions. Lungs CTAB. Gastrointestinal: Soft , nondistended, nontender to palpation. No CVA tenderness. Musculoskeletal:   No joint effusions.  Mild swelling to the dorsum of his right hand to the ulnar aspect.  Volar aspect of his right hand demonstrates a  remote-appearing puncture wound that appears to have a clean base and does not align with the swelling to the dorsum of his hand. Neurologic: . No gross focal neurologic deficits are appreciated.  Follows simple commands in all 4 extremities without apparent deficit. Skin:  Skin is warm, dry and intact. No rash noted. Psychiatric: Agitated and difficult to assess  ____________________________________________   LABS (all labs ordered are listed, but only  abnormal results are displayed)  Labs Reviewed  COMPREHENSIVE METABOLIC PANEL - Abnormal; Notable for the following components:      Result Value   BUN 37 (*)    AST 194 (*)    ALT 91 (*)    Total Bilirubin 1.5 (*)    All other components within normal limits  CBC - Abnormal; Notable for the following components:   WBC 11.8 (*)    RBC 3.80 (*)    Hemoglobin 11.5 (*)    HCT 34.6 (*)    All other components within normal limits  ETHANOL  LACTIC ACID, PLASMA  PROTIME-INR  APTT  AMMONIA  URINE DRUG SCREEN, QUALITATIVE ([facility] ONLY)  LACTIC ACID, PLASMA  URINALYSIS, COMPLETE (UACMP) WITH MICROSCOPIC  CK  COOXEMETRY PANEL   ____________________________________________  12 Lead EKG  Sinus rhythm, rate of 85 bpm.  With sinus arrhythmia.  Normal axis.  Incomplete right bundle and otherwise normal intervals.  QTc 461 ms.  No evidence of acute ischemia. ____________________________________________  RADIOLOGY  ED MD interpretation: CT/CTA head pending at time of signout to oncoming provider.  Official radiology report(s): No results found.  ____________________________________________   PROCEDURES and INTERVENTIONS  Procedure(s) performed (including Critical Care):  .1-3 Lead EKG Interpretation Performed by: Delton Prairie, MD Authorized by: Delton Prairie, MD     Interpretation: normal     ECG rate:  90   ECG rate assessment: normal     Rhythm: sinus rhythm     Ectopy: none     Conduction: normal      Medications  iohexol (OMNIPAQUE) 350 MG/ML injection 75 mL (has no administration in time range)  LORazepam (ATIVAN) injection 2 mg (has no administration in time range)  haloperidol lactate (HALDOL) injection 5 mg (has no administration in time range)  LORazepam (ATIVAN) injection 2 mg (2 mg Intravenous Given 09/18/20 1458)    ____________________________________________   MDM / ED COURSE   45 year old male with remote history of unprovoked DVT/PE presents to the  ED and agitated delirium of uncertain etiology.  Normal vitals on room air.  Exam demonstrates asymmetric pupils, otherwise no neurovascular deficits.  He is tremulous and agitated, follows simple commands and can answer simple questions such as his name and location.  No obvious deficits.  Basic labs are returning is unremarkable at the time of signout to oncoming provider, Dr. Antoine Primas.  To facilitate work-up and CT imaging, will provide anxiolysis.  Due to his history of DVT/PE, with his blown pupil and agitated state, CT/CTA head pending.  Anticipate he will require prolonged ED observation, versus medical admission due to his altered mentation and agitation.  Clinical Course as of 09/18/20 1519  Thu Sep 18, 2020  1509 Patient signed out to oncoming provider, Dr. Antoine Primas.  We go to the bedside and reevaluate patient together.  Patient paradoxically more agitated after 2 mg of IV Ativan, so we will provide additional anxiolysis to facilitate work-up.  Basic labs are returning at the time of signout and generally nonconcerning.  Awaiting CTA imaging of his head [DS]  Clinical Course User Index [DS] Delton Prairie, MD    ____________________________________________   FINAL CLINICAL IMPRESSION(S) / ED DIAGNOSES  Final diagnoses:  Agitated  Delirium     ED Discharge Orders    None       Zorah Backes Katrinka Blazing   Note:  This document was prepared using Dragon voice recognition software and may include unintentional dictation errors.   Delton Prairie, MD 09/18/20 [phone]
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
## 2020-09-18 NOTE — ED Notes (Signed)
Patient changed due to urinating on self by this Clinical research associate and ED tech Caitlyn. Patient was cleaned and fresh sheet on bed. Patient tolerated well.
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
## 2020-09-18 NOTE — ED Notes (Signed)
Rhabdo, polysubstance abuse   Gilles Chiquito, MD 09/18/20 [phone]

## 2020-09-19 DIAGNOSIS — M6282 Rhabdomyolysis: Secondary | ICD-10-CM

## 2020-09-19 DIAGNOSIS — D649 Anemia, unspecified: Secondary | ICD-10-CM

## 2020-09-19 LAB — CK: Total CK: 1705 U/L — ABNORMAL HIGH (ref 49–397)

## 2020-09-19 LAB — COMPREHENSIVE METABOLIC PANEL
ALT: 72 U/L — ABNORMAL HIGH (ref 0–44)
AST: 130 U/L — ABNORMAL HIGH (ref 15–41)
Albumin: 3.2 g/dL — ABNORMAL LOW (ref 3.5–5.0)
Alkaline Phosphatase: 69 U/L (ref 38–126)
Anion gap: 10 (ref 5–15)
BUN: 24 mg/dL — ABNORMAL HIGH (ref 6–20)
CO2: 24 mmol/L (ref 22–32)
Calcium: 8.6 mg/dL — ABNORMAL LOW (ref 8.9–10.3)
Chloride: 104 mmol/L (ref 98–111)
Creatinine, Ser: 0.89 mg/dL (ref 0.61–1.24)
GFR, Estimated: >60 mL/min (ref >60)
Glucose, Bld: 71 mg/dL (ref 70–99)
Potassium: 4.6 mmol/L (ref 3.5–5.1)
Sodium: 138 mmol/L (ref 135–145)
Total Bilirubin: 1.7 mg/dL — ABNORMAL HIGH (ref 0.3–1.2)
Total Protein: 6.4 g/dL — ABNORMAL LOW (ref 6.5–8.1)

## 2020-09-19 LAB — PROCALCITONIN: Procalcitonin: 0.46 ng/mL

## 2020-09-19 LAB — HIV ANTIBODY (ROUTINE TESTING W REFLEX): HIV Screen 4th Generation wRfx: NONREACTIVE

## 2020-09-19 LAB — CBC
HCT: 36.1 % — ABNORMAL LOW (ref 39.0–52.0)
Hemoglobin: 11.9 g/dL — ABNORMAL LOW (ref 13.0–17.0)
MCH: 30.2 pg (ref 26.0–34.0)
MCHC: 33 g/dL (ref 30.0–36.0)
MCV: 91.6 fL (ref 80.0–100.0)
Platelets: 249 10*3/uL (ref 150–400)
RBC: 3.94 MIL/uL — ABNORMAL LOW (ref 4.22–5.81)
RDW: 13.7 % (ref 11.5–15.5)
WBC: 6.6 10*3/uL (ref 4.0–10.5)
nRBC: 0 % (ref 0.0–0.2)

## 2020-09-19 LAB — MAGNESIUM: Magnesium: 2.1 mg/dL (ref 1.7–2.4)

## 2020-09-19 LAB — PHOSPHORUS: Phosphorus: 3.2 mg/dL (ref 2.5–4.6)

## 2020-09-19 MED ORDER — SODIUM CHLORIDE 0.9 % IV SOLN: INTRAVENOUS | Status: DC

## 2020-09-19 NOTE — ED Notes (Signed)
Pt able to ambulate to bathroom with steady gait. Pt clothes changed and bedsheets changed at this time. Pt tolerating food and liquids well at this time as well.

## 2020-09-19 NOTE — ED Notes (Signed)
Spoke with Mom Gwinda [place] [phone] to give update that patient was in ER and currently getting fluids waiting on admission.

## 2020-09-19 NOTE — Progress Notes (Signed)
PROGRESS NOTE    David Leach   TFT:[id number]  DOB: October 17, 1975  DOA: 09/18/2020     0  PCP: [facility]  CC: Brought in by police  Hospital Course: David Leach is a 45 year old male with PMH PE/DVT, chronic pain, asthma who was brought to the ER by police after being found wandering outside attempting to break into houses.  The patient was noted to be in acute psychosis when evaluated and was unable to provide any collateral information. UDS was positive for amphetamines and cocaine explaining his acute psychosis.  CK was also elevated consistent with rhabdomyolysis likely from underlying drug use.  He was admitted for treatment of his rhabdomyolysis and drug washout. There are no warrants out for his arrest.   Interval History:  Patient seen this morning in the ER.  He was curled up in fetal position on the bed.  He did try to mumble a few answers to my questions however was unintelligible.  Was able to follow my commands however.  He was otherwise in no distress and sleeping comfortably.  Mood was cooperative.  Old records reviewed in assessment of this patient  ROS: Review of systems not obtained due to patient factors.  Cognitive impairment  Assessment & Plan: * Amphetamine and psychostimulant-induced psychosis with delusions ([place]) - Patient uncooperative on admission and noted to be tremulous/agitated on admission - When seen this morning he was curled up in fetal position and unable to provide any significant collateral information - Continue supportive care and allowing drug washout - Continue fluids -Discussed with police officer in hallway, no warrants for arrest; at time of discharge he can go home  Rhabdomyolysis - CK 4459 on admission.  Etiology considered due to underlying amphetamine abuse - Continue fluids.  CK responding and downtrending - Repeat CK in a.m.  Normocytic anemia - Baseline hemoglobin around 11 to 12 g/dL.  Currently at baseline -  Etiology possibly due to superimposed alcohol use contributing to bone marrow suppression.  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  Dispo: The patient is from: Home              Anticipated d/c is to: Home              Anticipated d/c date is: 1 day              Patient currently is not medically stable to d/c.  Objective: Blood pressure 137/87, pulse 71, temperature 98 F (36.7 C), temperature source Oral, resp. rate 19, height _0  (1.778 m), weight 74.8 kg, SpO2 97 %.  Examination: General appearance: Thin adult man lying in bed curled up on his side sleeping and in no distress but does arouse easily to verbal stimuli Head: Normocephalic, without obvious abnormality, atraumatic Eyes: Right pupil asymmetrically round approximately 4 mm without much reaction to light.  Left pupil round, 2 mm, reactive to light Lungs: clear to auscultation bilaterally Heart: regular rate and rhythm and S1, S2 normal Abdomen: normal findings: bowel sounds normal and soft, non-tender Extremities: Thin, no edema Skin: mobility and turgor normal Neurologic: Follows commands and moves all 4 extremities  Consultants:     Procedures:     Data Reviewed: I have personally reviewed following labs and  imaging studies Results for orders placed or performed during the hospital encounter of 09/18/20 (from the past 24 hour(s))  Protime-INR     Status: None   Collection Time: 09/18/20  2:38 PM  Result Value Ref Range   Prothrombin Time 13.3 11.4 - 15.2 seconds   INR 1.1 0.8 - 1.2  APTT     Status: None   Collection Time: 09/18/20  2:38 PM  Result Value Ref Range   aPTT 34 24 - 36 seconds  CK     Status: Abnormal    Collection Time: 09/18/20  2:38 PM  Result Value Ref Range   Total CK 4,459 (H) 49 - 397 U/L  Ammonia     Status: None   Collection Time: 09/18/20  2:39 PM  Result Value Ref Range   Ammonia 25 9 - 35 umol/L  Urine Drug Screen, Qualitative     Status: Abnormal   Collection Time: 09/18/20  3:26 PM  Result Value Ref Range   Tricyclic, Ur Screen NONE DETECTED NONE DETECTED   Amphetamines, Ur Screen POSITIVE (A) NONE DETECTED   MDMA (Ecstasy)Ur Screen NONE DETECTED NONE DETECTED   Cocaine Metabolite,Ur [place] POSITIVE (A) NONE DETECTED   Opiate, Ur Screen NONE DETECTED NONE DETECTED   Phencyclidine (PCP) Ur S NONE DETECTED NONE DETECTED   Cannabinoid 50 Ng, Ur [facility] NONE DETECTED NONE DETECTED   Barbiturates, Ur Screen NONE DETECTED NONE DETECTED   Benzodiazepine, Ur Scrn NONE DETECTED NONE DETECTED   Methadone Scn, Ur NONE DETECTED NONE DETECTED  Urinalysis, Complete w Microscopic     Status: Abnormal   Collection Time: 09/18/20  3:26 PM  Result Value Ref Range   Color, Urine STRAW (A) YELLOW   APPearance CLEAR (A) CLEAR   Specific Gravity, Urine 1.018 1.005 - 1.030   pH 6.0 5.0 - 8.0   Glucose, UA NEGATIVE NEGATIVE mg/dL   Hgb urine dipstick NEGATIVE NEGATIVE   Bilirubin Urine NEGATIVE NEGATIVE   Ketones, ur 20 (A) NEGATIVE mg/dL   Protein, ur NEGATIVE NEGATIVE mg/dL   Nitrite NEGATIVE NEGATIVE   Leukocytes,Ua NEGATIVE NEGATIVE   WBC, UA NONE SEEN 0 - 5 WBC/hpf   Bacteria, UA NONE SEEN NONE SEEN   Squamous Epithelial / LPF NONE SEEN 0 - 5  Cooxemetry Panel, hospital-performed (carboxy, met, total hgb, O2 sat)     Status: None   Collection Time: 09/18/20  3:26 PM  Result Value Ref Range   O2 Saturation 82.1 %   Carboxyhemoglobin 1.5 0.5 - 1.5 %   Methemoglobin 0.2 0.0 - 1.5 %   Total oxygen content 77.1 mL/dL  Resp Panel by RT-PCR (Flu A&B, Covid) Nasopharyngeal Swab     Status: None   Collection Time: 09/18/20  4:05 PM   Specimen: Nasopharyngeal Swab; Nasopharyngeal(NP) swabs in  vial transport medium  Result Value Ref Range   SARS Coronavirus 2 by RT PCR NEGATIVE NEGATIVE   Influenza A by PCR NEGATIVE NEGATIVE   Influenza B by PCR NEGATIVE NEGATIVE  Blood culture (routine x 2)     Status: None (Preliminary result)   Collection Time: 09/18/20  4:52 PM   Specimen: BLOOD  Result Value Ref Range   Specimen Description BLOOD LEFT ANTECUBITAL    Special Requests      BOTTLES DRAWN AEROBIC AND ANAEROBIC Blood Culture results may not be optimal due to an inadequate volume of blood received in culture bottles   Culture      NO GROWTH < 24 HOURS Performed  at [facility], [address] [place], [place] [address]    Report Status PENDING   Procalcitonin - Baseline     Status: None   Collection Time: 09/18/20  5:03 PM  Result Value Ref Range   Procalcitonin 0.53 ng/mL  Blood culture (routine x 2)     Status: None (Preliminary result)   Collection Time: 09/18/20  5:03 PM   Specimen: BLOOD  Result Value Ref Range   Specimen Description BLOOD BLOOD LEFT HAND    Special Requests      BOTTLES DRAWN AEROBIC AND ANAEROBIC Blood Culture adequate volume   Culture      NO GROWTH < 24 HOURS Performed at [facility], [address] [place], [place] [address]    Report Status PENDING   Magnesium     Status: None   Collection Time: 09/18/20  5:03 PM  Result Value Ref Range   Magnesium 2.1 1.7 - 2.4 mg/dL  Phosphorus     Status: None   Collection Time: 09/18/20  5:03 PM  Result Value Ref Range   Phosphorus 3.2 2.5 - 4.6 mg/dL  Procalcitonin     Status: None   Collection Time: 09/19/20  4:54 AM  Result Value Ref Range   Procalcitonin 0.46 ng/mL  HIV Antibody (routine testing w rflx)     Status: None   Collection Time: 09/19/20  4:54 AM  Result Value Ref Range   HIV Screen 4th Generation wRfx Non Reactive Non Reactive  Comprehensive metabolic panel     Status: Abnormal   Collection Time: 09/19/20  4:54 AM  Result Value Ref Range   Sodium 138  135 - 145 mmol/L   Potassium 4.6 3.5 - 5.1 mmol/L   Chloride 104 98 - 111 mmol/L   CO2 24 22 - 32 mmol/L   Glucose, Bld 71 70 - 99 mg/dL   BUN 24 (H) 6 - 20 mg/dL   Creatinine, Ser 0.89 0.61 - 1.24 mg/dL   Calcium 8.6 (L) 8.9 - 10.3 mg/dL   Total Protein 6.4 (L) 6.5 - 8.1 g/dL   Albumin 3.2 (L) 3.5 - 5.0 g/dL   AST 130 (H) 15 - 41 U/L   ALT 72 (H) 0 - 44 U/L   Alkaline Phosphatase 69 38 - 126 U/L   Total Bilirubin 1.7 (H) 0.3 - 1.2 mg/dL   GFR, Estimated >60 >60 mL/min   Anion gap 10 5 - 15  CBC     Status: Abnormal   Collection Time: 09/19/20  4:54 AM  Result Value Ref Range   WBC 6.6 4.0 - 10.5 K/uL   RBC 3.94 (L) 4.22 - 5.81 MIL/uL   Hemoglobin 11.9 (L) 13.0 - 17.0 g/dL   HCT 36.1 (L) 39.0 - 52.0 %   MCV 91.6 80.0 - 100.0 fL   MCH 30.2 26.0 - 34.0 pg   MCHC 33.0 30.0 - 36.0 g/dL   RDW 13.7 11.5 - 15.5 %   Platelets 249 150 - 400 K/uL   nRBC 0.0 0.0 - 0.2 %  CK     Status: Abnormal   Collection Time: 09/19/20  4:54 AM  Result Value Ref Range   Total CK 1,705 (H) 49 - 397 U/L    Recent Results (from the past 240 hour(s))  Resp Panel by RT-PCR (Flu A&B, Covid) Nasopharyngeal Swab     Status: None   Collection Time: 09/18/20  4:05 PM   Specimen: Nasopharyngeal Swab; Nasopharyngeal(NP) swabs in vial transport medium  Result Value Ref Range Status   SARS Coronavirus 2 by RT PCR NEGATIVE NEGATIVE Final    Comment: (NOTE) SARS-CoV-2 target nucleic acids are NOT DETECTED.  The SARS-CoV-2 RNA is generally detectable in upper respiratory specimens during the acute phase of infection. The lowest concentration of SARS-CoV-2 viral copies this assay can detect is 138 copies/mL. A negative result does not preclude SARS-Cov-2 infection and should not be used as the sole basis for treatment or other patient management decisions. A negative result may occur with  improper specimen collection/handling, submission of specimen other than nasopharyngeal swab, presence of viral  mutation(s) within the areas targeted by this assay, and inadequate number of viral copies(<138 copies/mL). A negative result must be combined with clinical observations, patient history, and epidemiological information. The expected result is Negative.  Fact Sheet for Patients:  EntrepreneurPulse.com.au  Fact Sheet for Healthcare Providers:  IncredibleEmployment.be  This test is no t yet approved or cleared by the [place] FDA and  has been authorized for detection and/or diagnosis of SARS-CoV-2 by FDA under an Emergency Use Authorization (EUA). This EUA will remain  in effect (meaning this test can be used) for the duration of the COVID-19 declaration under Section 564(b)(1) of the Act, 21 U.S.C.section 360bbb-3(b)(1), unless the authorization is terminated  or revoked sooner.       Influenza A by PCR NEGATIVE NEGATIVE Final   Influenza B by PCR NEGATIVE NEGATIVE Final    Comment: (NOTE) The Xpert Xpress SARS-CoV-2/FLU/RSV plus assay is intended as an aid in the diagnosis of influenza from Nasopharyngeal swab specimens and should not be used as a sole basis for treatment. Nasal washings and aspirates are unacceptable for Xpert Xpress SARS-CoV-2/FLU/RSV testing.  Fact Sheet for Patients: EntrepreneurPulse.com.au  Fact Sheet for Healthcare Providers: IncredibleEmployment.be  This test is not yet approved or cleared by the [place] FDA and has been authorized for detection and/or diagnosis of SARS-CoV-2 by FDA under an Emergency Use Authorization (EUA). This EUA will remain in effect (meaning this test can be used) for the duration of the COVID-19 declaration under Section 564(b)(1) of the Act, 21 U.S.C. section 360bbb-3(b)(1), unless the authorization is terminated or revoked.  Performed at [facility], [address] [place], [place] [address]   Blood culture (routine x 2)      Status: None (Preliminary result)   Collection Time: 09/18/20  4:52 PM   Specimen: BLOOD  Result Value Ref Range Status   Specimen Description BLOOD LEFT ANTECUBITAL  Final   Special Requests   Final    BOTTLES DRAWN AEROBIC AND ANAEROBIC Blood Culture results may not be optimal due to an inadequate volume of blood received in culture bottles   Culture   Final    NO GROWTH < 24 HOURS Performed at [facility], [address], [place], [place] [address]    Report Status PENDING  Incomplete  Blood culture (routine x 2)     Status: None (Preliminary result)   Collection Time: 09/18/20  5:03 PM   Specimen: BLOOD  Result Value Ref Range Status   Specimen Description BLOOD BLOOD LEFT HAND  Final   Special Requests   Final    BOTTLES DRAWN AEROBIC AND ANAEROBIC Blood Culture adequate volume   Culture   Final    NO GROWTH < 24 HOURS Performed at [facility], [address], [place], [place] [address]    Report Status PENDING  Incomplete     Radiology Studies: CT  Angio Head W or Wo Contrast  Result Date: 09/18/2020 CLINICAL DATA:  Dizziness.  Blunt right pupil. EXAM: CT ANGIOGRAPHY HEAD TECHNIQUE: Multidetector CT imaging of the head was performed using the standard protocol during bolus administration of intravenous contrast. Multiplanar CT image reconstructions and MIPs were obtained to evaluate the vascular anatomy. CONTRAST:  59m OMNIPAQUE IOHEXOL 350 MG/ML SOLN COMPARISON:  CT head August 23, 2019 FINDINGS: CT HEAD Brain: No evidence of acute large vascular territory infarction, hemorrhage, hydrocephalus, extra-axial collection or mass lesion/mass effect. Skull: No acute fracture. Sinuses: Opacified left maxillary sinus with frothy secretions. Scattered ethmoid air cell mucosal thickening opacification. Unremarkable orbits. Orbits: No mastoid effusions. CTA HEAD Anterior circulation: No large vessel occlusion or proximal hemodynamically significant stenosis, aneurysm,  or vascular malformation. Early bifurcation of the left MCA. Dominant left A1 ACA. Multifocal mild narrowing of the smaller right ACA. No aneurysm identified. Small right PCOM infundibulum. Posterior circulation: No large vessel occlusion or proximal hemodynamically significant stenosis, aneurysm, or vascular malformation. Venous sinuses: As permitted by contrast timing, patent. IMPRESSION: 1. No evidence of acute intracranial abnormality on the noncontrast head CT. 2. No evidence of emergent large vessel occlusion or proximal hemodynamically significant stenosis. 3. Nonspecific paranasal sinus disease. Correlate with signs/symptoms of sinusitis. Electronically Signed   By: FMargaretha SheffieldMD   On: 09/18/2020 16:06   DG Chest 1 View  Result Date: 09/18/2020 CLINICAL DATA:  Tachypnea EXAM: CHEST  1 VIEW COMPARISON:  07/02/2020 FINDINGS: The heart size and mediastinal contours are within normal limits. Both lungs are clear. The visualized skeletal structures are unremarkable. IMPRESSION: No active disease. Electronically Signed   By: KDonavan FoilM.D.   On: 09/18/2020 16:32   DG Chest 1 View  Final Result    CT Angio Head W or Wo Contrast  Final Result      Scheduled Meds: . enoxaparin (LOVENOX) injection  40 mg Subcutaneous Q24H  . folic acid  1 mg Oral Daily  . multivitamin with minerals  1 tablet Oral Daily  . sodium chloride flush  3 mL Intravenous Q12H  . thiamine  100 mg Oral Daily   Or  . thiamine  100 mg Intravenous Daily   PRN Meds: acetaminophen **OR** acetaminophen, LORazepam **OR** LORazepam, ondansetron **OR** ondansetron (ZOFRAN) IV Continuous Infusions: . sodium chloride 150 mL/hr at 09/19/20 0748     LOS: 0 days  Time spent: Greater than 50% of the 35 minute visit was spent in counseling/coordination of care for the patient as laid out in the A&P.   DDwyane Dee MD [facility] 09/19/2020, 1:52 PM

## 2020-09-19 NOTE — ED Notes (Signed)
Pt given sandwich tray, water, crackers and peanut butter per pts request

## 2020-09-19 NOTE — ED Notes (Signed)
Pt provided with water

## 2020-09-19 NOTE — Assessment & Plan Note (Signed)
-   Baseline hemoglobin around 11 to 12 g/dL.  Currently at baseline - Etiology possibly due to superimposed alcohol use contributing to bone marrow suppression.  No other significant platelet or neutrophil abnormality noted

## 2020-09-19 NOTE — Assessment & Plan Note (Addendum)
-   Patient uncooperative on admission and noted to be tremulous/agitated on admission -Patient slowly improved during the remainder of hospitalization and returned to normal baseline.  He was alert and oriented prior to discharge and mood was calm and cooperative

## 2020-09-19 NOTE — Assessment & Plan Note (Addendum)
-   CK 4459 on admission.  Etiology considered due to underlying amphetamine abuse -CK down trended with fluids.  Patient was able to be started on a diet prior to discharge. - Renal function remained stable and normal

## 2020-09-19 NOTE — Hospital Course (Addendum)
David Leach is a 45 year old male with PMH PE/DVT, chronic pain, asthma who was brought to the ER by police after being found wandering outside attempting to break into houses.  The patient was noted to be in acute psychosis when evaluated and was unable to provide any collateral information. UDS was positive for amphetamines and cocaine explaining his acute psychosis.  CK was also elevated consistent with rhabdomyolysis likely from underlying drug use.  He was admitted for treatment of his rhabdomyolysis and drug washout. There are no warrants out for his arrest.  With ongoing monitoring and IV fluids, his mentation improved.  CK down trended.  He had no renal dysfunction.  He was considered stable for discharging home.  He was instructed to avoid further drug use in the future.

## 2020-09-19 NOTE — Assessment & Plan Note (Signed)
-   See psychosis

## 2020-09-20 ENCOUNTER — Emergency Department: Payer: Self-pay

## 2020-09-20 ENCOUNTER — Other Ambulatory Visit: Payer: Self-pay

## 2020-09-20 ENCOUNTER — Emergency Department
Admission: EM | Admit: 2020-09-20 | Discharge: 2020-09-21 | Disposition: A | Discharge status: Home / Self Care | Payer: Self-pay | Attending: Emergency Medicine | Admitting: Emergency Medicine

## 2020-09-20 ENCOUNTER — Encounter: Payer: Self-pay | Admitting: Intensive Care

## 2020-09-20 DIAGNOSIS — M6282 Rhabdomyolysis: Secondary | ICD-10-CM | POA: Insufficient documentation

## 2020-09-20 DIAGNOSIS — F1910 Other psychoactive substance abuse, uncomplicated: Secondary | ICD-10-CM | POA: Insufficient documentation

## 2020-09-20 DIAGNOSIS — J45909 Unspecified asthma, uncomplicated: Secondary | ICD-10-CM | POA: Insufficient documentation

## 2020-09-20 DIAGNOSIS — T50901A Poisoning by unspecified drugs, medicaments and biological substances, accidental (unintentional), initial encounter: Secondary | ICD-10-CM | POA: Insufficient documentation

## 2020-09-20 DIAGNOSIS — F17210 Nicotine dependence, cigarettes, uncomplicated: Secondary | ICD-10-CM | POA: Insufficient documentation

## 2020-09-20 DIAGNOSIS — F19920 Other psychoactive substance use, unspecified with intoxication, uncomplicated: Secondary | ICD-10-CM

## 2020-09-20 HISTORY — DX: Other psychoactive substance abuse, uncomplicated: F19.10

## 2020-09-20 LAB — URINE DRUG SCREEN, QUALITATIVE (ARMC ONLY)
Amphetamines, Ur Screen: POSITIVE — AB
Barbiturates, Ur Screen: NOT DETECTED
Benzodiazepine, Ur Scrn: POSITIVE — AB
Cannabinoid 50 Ng, Ur ~~LOC~~: NOT DETECTED
Cocaine Metabolite,Ur ~~LOC~~: NOT DETECTED
MDMA (Ecstasy)Ur Screen: NOT DETECTED
Methadone Scn, Ur: NOT DETECTED
Opiate, Ur Screen: POSITIVE — AB
Phencyclidine (PCP) Ur S: NOT DETECTED
Tricyclic, Ur Screen: NOT DETECTED

## 2020-09-20 LAB — COMPREHENSIVE METABOLIC PANEL
ALT: 55 U/L — ABNORMAL HIGH (ref 0–44)
ALT: 64 U/L — ABNORMAL HIGH (ref 0–44)
AST: 70 U/L — ABNORMAL HIGH (ref 15–41)
AST: 77 U/L — ABNORMAL HIGH (ref 15–41)
Albumin: 2.8 g/dL — ABNORMAL LOW (ref 3.5–5.0)
Albumin: 3.3 g/dL — ABNORMAL LOW (ref 3.5–5.0)
Alkaline Phosphatase: 63 U/L (ref 38–126)
Alkaline Phosphatase: 71 U/L (ref 38–126)
Anion gap: 8 (ref 5–15)
Anion gap: 8 (ref 5–15)
BUN: 20 mg/dL (ref 6–20)
BUN: 15 mg/dL (ref 6–20)
CO2: 25 mmol/L (ref 22–32)
CO2: 27 mmol/L (ref 22–32)
Calcium: 8.1 mg/dL — ABNORMAL LOW (ref 8.9–10.3)
Calcium: 8.5 mg/dL — ABNORMAL LOW (ref 8.9–10.3)
Chloride: 107 mmol/L (ref 98–111)
Chloride: 105 mmol/L (ref 98–111)
Creatinine, Ser: 0.77 mg/dL (ref 0.61–1.24)
Creatinine, Ser: 0.89 mg/dL (ref 0.61–1.24)
GFR, Estimated: >60 mL/min (ref >60)
GFR, Estimated: >60 mL/min (ref >60)
Glucose, Bld: 94 mg/dL (ref 70–99)
Glucose, Bld: 150 mg/dL — ABNORMAL HIGH (ref 70–99)
Potassium: 4 mmol/L (ref 3.5–5.1)
Potassium: 3.7 mmol/L (ref 3.5–5.1)
Sodium: 140 mmol/L (ref 135–145)
Sodium: 140 mmol/L (ref 135–145)
Total Bilirubin: 0.8 mg/dL (ref 0.3–1.2)
Total Bilirubin: 0.7 mg/dL (ref 0.3–1.2)
Total Protein: 5.6 g/dL — ABNORMAL LOW (ref 6.5–8.1)
Total Protein: 6.6 g/dL (ref 6.5–8.1)

## 2020-09-20 LAB — CBC WITH DIFFERENTIAL/PLATELET
Abs Immature Granulocytes: 0.02 10*3/uL (ref 0.00–0.07)
Basophils Absolute: 0.1 10*3/uL (ref 0.0–0.1)
Basophils Relative: 1 %
Eosinophils Absolute: 0.3 10*3/uL (ref 0.0–0.5)
Eosinophils Relative: 3 %
HCT: 32.6 % — ABNORMAL LOW (ref 39.0–52.0)
Hemoglobin: 11.3 g/dL — ABNORMAL LOW (ref 13.0–17.0)
Immature Granulocytes: 0 %
Lymphocytes Relative: 29 %
Lymphs Abs: 2.2 10*3/uL (ref 0.7–4.0)
MCH: 30.9 pg (ref 26.0–34.0)
MCHC: 34.7 g/dL (ref 30.0–36.0)
MCV: 89.1 fL (ref 80.0–100.0)
Monocytes Absolute: 1.1 10*3/uL — ABNORMAL HIGH (ref 0.1–1.0)
Monocytes Relative: 14 %
Neutro Abs: 4 10*3/uL (ref 1.7–7.7)
Neutrophils Relative %: 53 %
Platelets: 242 10*3/uL (ref 150–400)
RBC: 3.66 MIL/uL — ABNORMAL LOW (ref 4.22–5.81)
RDW: 13.6 % (ref 11.5–15.5)
WBC: 7.6 10*3/uL (ref 4.0–10.5)
nRBC: 0 % (ref 0.0–0.2)

## 2020-09-20 LAB — CBC
HCT: 34.2 % — ABNORMAL LOW (ref 39.0–52.0)
Hemoglobin: 11.4 g/dL — ABNORMAL LOW (ref 13.0–17.0)
MCH: 30.2 pg (ref 26.0–34.0)
MCHC: 33.3 g/dL (ref 30.0–36.0)
MCV: 90.5 fL (ref 80.0–100.0)
Platelets: 255 10*3/uL (ref 150–400)
RBC: 3.78 MIL/uL — ABNORMAL LOW (ref 4.22–5.81)
RDW: 13.5 % (ref 11.5–15.5)
WBC: 10.9 10*3/uL — ABNORMAL HIGH (ref 4.0–10.5)
nRBC: 0 % (ref 0.0–0.2)

## 2020-09-20 LAB — ETHANOL: Alcohol, Ethyl (B): <10 mg/dL (ref <10)

## 2020-09-20 LAB — URINE CULTURE: Culture: NO GROWTH

## 2020-09-20 LAB — PROCALCITONIN: Procalcitonin: 0.2 ng/mL

## 2020-09-20 LAB — MAGNESIUM: Magnesium: 1.7 mg/dL (ref 1.7–2.4)

## 2020-09-20 NOTE — ED Notes (Signed)
Report received from Exline, RN including Situation, Background, Assessment, and Recommendations. Patient alert and oriented, drowsy, warm and dry, and in no acute distress. Patient denies HI, AVH and pain. Patient made aware of Q15 minute rounds and Psychologist, counselling presence for their safety. Patient instructed to come to this nurse with needs or concerns.

## 2020-09-20 NOTE — ED Notes (Signed)
Hourly rounding completed at this time, patient currently asleep in hallway bed. No complaints, stable, and in no acute distress. Q15 minute rounds and monitoring via Rover and Officer to continue. 

## 2020-09-20 NOTE — ED Triage Notes (Addendum)
Patient brought in by EMS for drug overdose. Patient very lethargic. Will awaken when is name is called loudly. Alert to self, place, and situation. Per first nurse from EMS, he was given 4 narcan by EMS

## 2020-09-20 NOTE — ED Notes (Signed)
Pt provided with water and OJ to drink.

## 2020-09-20 NOTE — ED Notes (Signed)
Pt is at CT at this time. 

## 2020-09-20 NOTE — ED Notes (Signed)
Hourly rounding completed at this time, patient currently awake in hallway bed. No complaints, stable, and in no acute distress. Q15 minute rounds and monitoring via Rover and Officer to continue. 

## 2020-09-20 NOTE — ED Notes (Signed)
Patient up out of bed using restroom in sink.

## 2020-09-20 NOTE — ED Notes (Signed)
Pt sleepy but arousable. VSS. Talking about "going to [employer] with a friend" and "going to sleep".

## 2020-09-20 NOTE — ED Provider Notes (Signed)
[facility] Emergency Department Provider Note  ____________________________________________  Time seen: Approximately 9:01 PM  I have reviewed the triage vital signs and the nursing notes.   HISTORY  Chief Complaint Drug Overdose    HPI David Leach is a 45 y.o. male with a history of drug abuse, DVT who came to the ED today via EMS due to being found unresponsive, suspected drug overdose.  EMS gave 4 mg of Narcan with improvement in mental status.  Patient reports taking an unknown drug given to him by a friend.  Denies any pain or shortness of breath, currently feels fine.  Denies any alcohol or medication ingestion.  He does report multiple falls over the last several weeks.   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  Admitted to the hospital.  However, while boarding in the ED waiting for an inpatient bed, he apparently eloped yesterday.   Past Medical History:  Diagnosis Date  . Asthma   . Chronic dental pain   . Chronic shoulder pain   . Drug abuse (HCC)   . DVT (deep venous thrombosis) (HCC)   . Headache   . Pulmonary embolism (HCC)   . Right-sided back pain      Patient Active Problem List   Diagnosis Date Noted  . Normocytic anemia 09/19/2020  . 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 Cellulitis of right knee 07/06/2019  . Tobacco abuse 07/06/2019  . 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 Foot pain, bilateral 12/27/2016  . Pleuritic chest pain 12/27/2016  . Acute renal insufficiency 12/27/2016  . Pain of foot 12/27/2016  . Asthma 11/06/2016     Past Surgical History:  Procedure Laterality Date  . TONSILLECTOMY       Prior to Admission medications   Not on File     Allergies Hydrocodone   Family History  Problem Relation Age of Onset  . Cancer Mother   . Diabetes Father   . Cancer Other   . Stroke Other   . Diabetes Other     Social  History Social History   Tobacco Use  . Smoking status: Current Every Day Smoker    Packs/day: 1.00    Years: 20.00    Pack years: 20.00    Types: Cigarettes  . Smokeless tobacco: Former Neurosurgeon    Types: Chew  Substance Use Topics  . Alcohol use: Yes    Comment: occ  . Drug use: Yes    Types: Methamphetamines, Cocaine    Comment: heroin and meth    Review of Systems  Constitutional:   No fever or chills.  ENT:   No sore throat. No rhinorrhea. Cardiovascular:   No chest pain or syncope. Respiratory:   No dyspnea or cough. Gastrointestinal:   Negative for abdominal pain, vomiting and diarrhea.  Musculoskeletal:   Negative for focal pain or swelling All other systems reviewed and are negative except as documented above in ROS and HPI.  ____________________________________________   PHYSICAL EXAM:  VITAL SIGNS: ED Triage Vitals  Enc Vitals Group     BP 09/20/20 1622 117/69     Pulse Rate 09/20/20 1622 (!) 108     Resp 09/20/20 1622 20     Temp 09/20/20 1622 98.7 F (37.1 C)     Temp Source 09/20/20 1622 Oral     SpO2 09/20/20 1622 96 %     Weight 09/20/20 1636 165 lb (74.8 kg)  Height 09/20/20 1636 5\' 10"  (1.778 m)     Head Circumference --      Peak Flow --      Pain Score 09/20/20 1635 10     Pain Loc --      Pain Edu? --      Excl. in GC? --     Vital signs reviewed, nursing assessments reviewed.   Constitutional:   Alert and oriented. Non-toxic appearance.  Somnolent but easily arousable and interactive Eyes:   Conjunctivae are normal. EOMI. PERRL. ENT      Head:   Normocephalic with scattered abrasions of various stages of healing on the forehead.      Nose:   Wearing a mask.      Mouth/Throat:   Wearing a mask.      Neck:   No meningismus. Full ROM.  No midline tenderness Hematological/Lymphatic/Immunilogical:   No cervical lymphadenopathy. Cardiovascular:   RRR. Symmetric bilateral radial and DP pulses.  No murmurs. Cap refill less than 2  seconds. Respiratory:   Normal respiratory effort without tachypnea/retractions. Breath sounds are clear and equal bilaterally. No wheezes/rales/rhonchi. Gastrointestinal:   Soft and nontender. Non distended. There is no CVA tenderness.  No rebound, rigidity, or guarding. Musculoskeletal:   Normal range of motion in all extremities. No joint effusions.  No lower extremity tenderness.  No edema. Neurologic:   Normal speech and language.  Motor grossly intact. No acute focal neurologic deficits are appreciated.  Skin:    Skin is warm, dry and intact. No rash noted.  No petechiae, purpura, or bullae.  ____________________________________________    LABS (pertinent positives/negatives) (all labs ordered are listed, but only abnormal results are displayed) Labs Reviewed  COMPREHENSIVE METABOLIC PANEL - Abnormal; Notable for the following components:      Result Value   Glucose, Bld 150 (*)    Calcium 8.5 (*)    Albumin 3.3 (*)    AST 77 (*)    ALT 64 (*)    All other components within normal limits  CBC - Abnormal; Notable for the following components:   WBC 10.9 (*)    RBC 3.78 (*)    Hemoglobin 11.4 (*)    HCT 34.2 (*)    All other components within normal limits  URINE DRUG SCREEN, QUALITATIVE ([facility] ONLY) - Abnormal; Notable for the following components:   Amphetamines, Ur Screen POSITIVE (*)    Opiate, Ur Screen POSITIVE (*)    Benzodiazepine, Ur Scrn POSITIVE (*)    All other components within normal limits  ETHANOL   ____________________________________________   EKG    ____________________________________________    RADIOLOGY  DG Chest 1 View  Result Date: 09/20/2020 CLINICAL DATA:  Overdose, tobacco abuse EXAM: CHEST  1 VIEW COMPARISON:  09/18/2020 FINDINGS: The heart size and mediastinal contours are within normal limits. Both lungs are clear. The visualized skeletal structures are unremarkable. IMPRESSION: No active disease. Electronically Signed   By: 09/20/2020 M.D.   On: 09/20/2020 17:31   CT Head Wo Contrast  Result Date: 09/20/2020 CLINICAL DATA:  45 year old male with head trauma. EXAM: CT HEAD WITHOUT CONTRAST TECHNIQUE: Contiguous axial images were obtained from the base of the skull through the vertex without intravenous contrast. COMPARISON:  Head CT dated 09/18/2020. FINDINGS: Brain: The ventricles and sulci appropriate size for patient's age. The gray-white matter discrimination is preserved. There is no acute intracranial hemorrhage. No mass effect midline shift no extra-axial fluid collection. Vascular: No hyperdense vessel or unexpected  calcification. Skull: Normal. Negative for fracture or focal lesion. Sinuses/Orbits: There is diffuse mucoperiosteal thickening of paranasal sinuses. No air-fluid level. The mastoid air cells are clear. Other: None IMPRESSION: 1. No acute intracranial pathology. 2. Paranasal sinus disease. Electronically Signed   By: Elgie Collard M.D.   On: 09/20/2020 19:49    ____________________________________________   PROCEDURES Procedures  ____________________________________________    CLINICAL IMPRESSION / ASSESSMENT AND PLAN / ED COURSE  Medications ordered in the ED: Medications - No data to display  Pertinent labs & imaging results that were available during my care of the patient were reviewed by me and considered in my medical decision making (see chart for details).  David Leach was evaluated in Emergency Department on 09/20/2020 for the symptoms described in the history of present illness. He was evaluated in the context of the global COVID-19 pandemic, which necessitated consideration that the patient might be at risk for infection with the SARS-CoV-2 virus that causes COVID-19. Institutional protocols and algorithms that pertain to the evaluation of patients at risk for COVID-19 are in a state of rapid change based on information released by regulatory bodies including the CDC and federal  and state organizations. These policies and algorithms were followed during the patient's care in the ED.   Patient presents with likely intoxication secondary to substance use disorder.  Doubt dehydration electrolyte abnormality intracranial hemorrhage or acute organ failure.  Lab panel is overall unremarkable except UDS which is positive for amphetamine opiate and benzodiazepine.  Vital signs normal in the ED.  Mental status is returning to normal.  Chest x-ray viewed and interpreted by me, unremarkable.  CT head report reviewed, unremarkable.  Will p.o. trial, plan to discharge home.      ____________________________________________   FINAL CLINICAL IMPRESSION(S) / ED DIAGNOSES    Final diagnoses:  Polysubstance abuse (HCC)  Intoxication by drug, uncomplicated [facility])     ED Discharge Orders    None      Portions of this note were generated with dragon dictation software. Dictation errors may occur despite best attempts at proofreading.   Sharman Cheek, MD 09/20/20 2104

## 2020-09-20 NOTE — ED Notes (Signed)
Pt left without VSS and without signing. D/c paperwork given.

## 2020-09-20 NOTE — ED Notes (Signed)
Overdose per EMS. Given 4mg  Narcan per EMS

## 2020-09-20 NOTE — ED Notes (Signed)
Pt provided with water to drink for PO challenge, pt able to keep fluid down without difficulty. No choking on fluid.

## 2020-09-20 NOTE — ED Notes (Signed)
Hourly rounding completed at this time, patient currently awake in room. No complaints, stable, and in no acute distress. Q15 minute rounds and monitoring via Rover and Officer to continue. °

## 2020-09-21 NOTE — ED Notes (Signed)
Continuing to attempt to reach people for ride home for pt. Pt is unable to find ride, has only reached one number he has attempted to call, this person said no. Pt does not have a address he can go to, keeps saying he can go to [address] on [address]. where his mother lives. States this is a assisted living home. Pt states that he would not be able to get inside and could knock on the window. Unable to send pt anywhere due to unsure if he can get inside and due to safety.

## 2020-09-21 NOTE — Discharge Summary (Signed)
Physician Discharge Summary   David Leach:[id number] DOB: [birth year] DOA: 09/18/2020  PCP: [facility]  Admit date: 09/18/2020 Discharge date: 09/21/2020  Admitted From: home Disposition:  home Discharging physician: Lewie Chamber, MD  Recommendations for Outpatient Follow-up:  1. Patient recommended to avoid further drug use in the future   Patient discharged to home in Discharge Condition: stable CODE STATUS: Full Diet recommendation:  Diet Orders (From admission, onward)    Start     Ordered   09/20/20 0000  Diet general        09/20/20 0946          Hospital Course: Mr. David Leach is a 45 year old male with PMH PE/DVT, chronic pain, asthma who was brought to the ER by police after being found wandering outside attempting to break into houses.  The patient was noted to be in acute psychosis when evaluated and was unable to provide any collateral information. UDS was positive for amphetamines and cocaine explaining his acute psychosis.  CK was also elevated consistent with rhabdomyolysis likely from underlying drug use.  He was admitted for treatment of his rhabdomyolysis and drug washout. There are no warrants out for his arrest.  With ongoing monitoring and IV fluids, his mentation improved.  CK down trended.  He had no renal dysfunction.  He was considered stable for discharging home.  He was instructed to avoid further drug use in the future.   * Amphetamine and psychostimulant-induced psychosis with delusions (HCC)-resolved as of 09/20/2020 - Patient uncooperative on admission and noted to be tremulous/agitated on admission -Patient slowly improved during the remainder of hospitalization and returned to normal baseline.  He was alert and oriented prior to discharge and mood was calm and cooperative  Rhabdomyolysis-resolved as of 09/20/2020 - CK 4459 on admission.  Etiology considered due to underlying amphetamine abuse -CK down trended with fluids.  Patient  was able to be started on a diet prior to discharge. - Renal function remained stable and normal  Normocytic anemia - Baseline hemoglobin around 11 to 12 g/dL.  Currently at baseline - Etiology possibly due to superimposed alcohol use contributing to bone marrow suppression.  No other significant platelet or neutrophil abnormality noted  Amphetamine abuse (HCC) - See psychosis    The patient's chronic medical conditions were treated accordingly per the patient's home medication regimen except as noted.  On day of discharge, patient was felt deemed stable for discharge. Patient/family member advised to call PCP or come back to ER if needed.   Principal Diagnosis: Amphetamine and psychostimulant-induced psychosis with delusions [facility])  Discharge Diagnoses: Active Hospital Problems   Diagnosis Date Noted  . Normocytic anemia 09/19/2020  . 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 Asthma 11/06/2016    Resolved Hospital Problems   Diagnosis Date Noted Date Resolved  . Amphetamine and psychostimulant-induced psychosis with delusions (HCC) 07/03/2020 09/20/2020    Priority: High  . Rhabdomyolysis 09/18/2020 09/20/2020    Priority: High    Discharge Instructions    Diet general   Complete by: As directed    Increase activity slowly   Complete by: As directed      Allergies as of 09/20/2020      Reactions   Hydrocodone Rash, Itching      Medication List    You have not been prescribed any medications.     Allergies  Allergen Reactions  . Hydrocodone Rash and Itching    Consultations:   Discharge Exam: BP 140/82 (BP Location: Left Arm)  Pulse 97   Temp 99.1 F (37.3 C) (Oral)   Resp 20   Ht 5\' 10"  (1.778 m)   Wt 74.8 kg   SpO2 99%   BMI 23.68 kg/m  General appearance:  Thin man sitting up in bed appearing more awake and alert.  Comfortable and no distress Head: Normocephalic, without obvious abnormality, atraumatic Eyes: Right pupil asymmetrically round  approximately 4 mm without much reaction to light.  Left pupil round, 2 mm, reactive to light Lungs: clear to auscultation bilaterally Heart: regular rate and rhythm and S1, S2 normal Abdomen: normal findings: bowel sounds normal and soft, non-tender Extremities: Thin, no edema Skin: mobility and turgor normal Neurologic:  No focal deficits.  Gait stable  The results of significant diagnostics from this hospitalization (including imaging, microbiology, ancillary and laboratory) are listed below for reference.   Microbiology: Recent Results (from the past 240 hour(s))  Resp Panel by RT-PCR (Flu A&B, Covid) Nasopharyngeal Swab     Status: None   Collection Time: 09/18/20  4:05 PM   Specimen: Nasopharyngeal Swab; Nasopharyngeal(NP) swabs in vial transport medium  Result Value Ref Range Status   SARS Coronavirus 2 by RT PCR NEGATIVE NEGATIVE Final    Comment: (NOTE) SARS-CoV-2 target nucleic acids are NOT DETECTED.  The SARS-CoV-2 RNA is generally detectable in upper respiratory specimens during the acute phase of infection. The lowest concentration of SARS-CoV-2 viral copies this assay can detect is 138 copies/mL. A negative result does not preclude SARS-Cov-2 infection and should not be used as the sole basis for treatment or other patient management decisions. A negative result may occur with  improper specimen collection/handling, submission of specimen other than nasopharyngeal swab, presence of viral mutation(s) within the areas targeted by this assay, and inadequate number of viral copies(<138 copies/mL). A negative result must be combined with clinical observations, patient history, and epidemiological information. The expected result is Negative.  Fact Sheet for Patients:  09/20/20  Fact Sheet for Healthcare Providers:  BloggerCourse.com  This test is no t yet approved or cleared by the [place] FDA and  has  been authorized for detection and/or diagnosis of SARS-CoV-2 by FDA under an Emergency Use Authorization (EUA). This EUA will remain  in effect (meaning this test can be used) for the duration of the COVID-19 declaration under Section 564(b)(1) of the Act, 21 U.S.C.section 360bbb-3(b)(1), unless the authorization is terminated  or revoked sooner.       Influenza A by PCR NEGATIVE NEGATIVE Final   Influenza B by PCR NEGATIVE NEGATIVE Final    Comment: (NOTE) The Xpert Xpress SARS-CoV-2/FLU/RSV plus assay is intended as an aid in the diagnosis of influenza from Nasopharyngeal swab specimens and should not be used as a sole basis for treatment. Nasal washings and aspirates are unacceptable for Xpert Xpress SARS-CoV-2/FLU/RSV testing.  Fact Sheet for Patients: [place]  Fact Sheet for Healthcare Providers: BloggerCourse.com  This test is not yet approved or cleared by the [place] FDA and has been authorized for detection and/or diagnosis of SARS-CoV-2 by FDA under an Emergency Use Authorization (EUA). This EUA will remain in effect (meaning this test can be used) for the duration of the COVID-19 declaration under Section 564(b)(1) of the Act, 21 U.S.C. section 360bbb-3(b)(1), unless the authorization is terminated or revoked.  Performed at [facility], [address] [place], [place]   Blood culture (routine x 2)     Status: None (Preliminary result)   Collection Time: 09/18/20  4:52  PM   Specimen: BLOOD  Result Value Ref Range Status   Specimen Description BLOOD LEFT ANTECUBITAL  Final   Special Requests   Final    BOTTLES DRAWN AEROBIC AND ANAEROBIC Blood Culture results may not be optimal due to an inadequate volume of blood received in culture bottles   Culture   Final    NO GROWTH 3 DAYS Performed at [facility], [address] [place], [place] [address]    Report Status  PENDING  Incomplete  Urine culture     Status: None   Collection Time: 09/18/20  4:52 PM   Specimen: In/Out Cath Urine  Result Value Ref Range Status   Specimen Description   Final    IN/OUT CATH URINE Performed at [facility], [address] [place], [place] [address]    Special Requests   Final    NONE Performed at [facility], [address] [place], [place] [address]    Culture   Final    NO GROWTH Performed at [facility], [address] [place], [place] [address]    Report Status 09/20/2020 FINAL  Final  Blood culture (routine x 2)     Status: None (Preliminary result)   Collection Time: 09/18/20  5:03 PM   Specimen: BLOOD  Result Value Ref Range Status   Specimen Description BLOOD BLOOD LEFT HAND  Final   Special Requests   Final    BOTTLES DRAWN AEROBIC AND ANAEROBIC Blood Culture adequate volume   Culture   Final    NO GROWTH 3 DAYS Performed at [facility], [address] [place], [place] [address]    Report Status PENDING  Incomplete     Labs: BNP (last 3 results) No results for input(s): BNP in the last 8760 hours. Basic Metabolic Panel: Recent Labs  Lab 09/18/20 1345 09/18/20 1703 09/19/20 0454 09/20/20 0545 09/20/20 1622  NA 136  --  138 140 140  K 4.3  --  4.6 4.0 3.7  CL 98  --  104 107 105  CO2 25  --  24 25 27   GLUCOSE 98  --  71 94 150*  BUN 37*  --  24* 20 15  CREATININE 1.11  --  0.89 0.77 0.89  CALCIUM 9.2  --  8.6* 8.1* 8.5*  MG  --  2.1  --  1.7  --   PHOS  --  3.2  --   --   --    Liver Function Tests: Recent Labs  Lab 09/18/20 1345 09/19/20 0454 09/20/20 0545 09/20/20 1622  AST 194* 130* 70* 77*  ALT 91* 72* 55* 64*  ALKPHOS 84 69 63 71  BILITOT 1.5* 1.7* 0.8 0.7  PROT 7.8 6.4* 5.6* 6.6  ALBUMIN 4.1 3.2* 2.8* 3.3*   No results for input(s): LIPASE, AMYLASE in the last 168 hours. Recent Labs  Lab 09/18/20 1439  AMMONIA 25   CBC: Recent Labs  Lab 09/18/20 1345  09/19/20 0454 09/20/20 0545 09/20/20 1622  WBC 11.8* 6.6 7.6 10.9*  NEUTROABS  --   --  4.0  --   HGB 11.5* 11.9* 11.3* 11.4*  HCT 34.6* 36.1* 32.6* 34.2*  MCV 91.1 91.6 89.1 90.5  PLT 273 249 242 255   Cardiac Enzymes: Recent Labs  Lab 09/18/20 1438 09/19/20 0454  CKTOTAL 4,459* 1,705*   BNP: Invalid input(s): POCBNP CBG: No results for input(s): GLUCAP in the last 168 hours. D-Dimer No results for input(s):  DDIMER in the last 72 hours. Hgb A1c No results for input(s): HGBA1C in the last 72 hours. Lipid Profile No results for input(s): CHOL, HDL, LDLCALC, TRIG, CHOLHDL, LDLDIRECT in the last 72 hours. Thyroid function studies No results for input(s): TSH, T4TOTAL, T3FREE, THYROIDAB in the last 72 hours.  Invalid input(s): FREET3 Anemia work up No results for input(s): VITAMINB12, FOLATE, FERRITIN, TIBC, IRON, RETICCTPCT in the last 72 hours. Urinalysis    Component Value Date/Time   COLORURINE STRAW (A) 09/18/2020 1526   APPEARANCEUR CLEAR (A) 09/18/2020 1526   LABSPEC 1.018 09/18/2020 1526   PHURINE 6.0 09/18/2020 1526   GLUCOSEU NEGATIVE 09/18/2020 1526   HGBUR NEGATIVE 09/18/2020 1526   BILIRUBINUR NEGATIVE 09/18/2020 1526   KETONESUR 20 (A) 09/18/2020 1526   PROTEINUR NEGATIVE 09/18/2020 1526   UROBILINOGEN 0.2 11/10/2014 1735   NITRITE NEGATIVE 09/18/2020 1526   LEUKOCYTESUR NEGATIVE 09/18/2020 1526   Sepsis Labs Invalid input(s): PROCALCITONIN,  WBC,  LACTICIDVEN Microbiology Recent Results (from the past 240 hour(s))  Resp Panel by RT-PCR (Flu A&B, Covid) Nasopharyngeal Swab     Status: None   Collection Time: 09/18/20  4:05 PM   Specimen: Nasopharyngeal Swab; Nasopharyngeal(NP) swabs in vial transport medium  Result Value Ref Range Status   SARS Coronavirus 2 by RT PCR NEGATIVE NEGATIVE Final    Comment: (NOTE) SARS-CoV-2 target nucleic acids are NOT DETECTED.  The SARS-CoV-2 RNA is generally detectable in upper respiratory specimens during the  acute phase of infection. The lowest concentration of SARS-CoV-2 viral copies this assay can detect is 138 copies/mL. A negative result does not preclude SARS-Cov-2 infection and should not be used as the sole basis for treatment or other patient management decisions. A negative result may occur with  improper specimen collection/handling, submission of specimen other than nasopharyngeal swab, presence of viral mutation(s) within the areas targeted by this assay, and inadequate number of viral copies(<138 copies/mL). A negative result must be combined with clinical observations, patient history, and epidemiological information. The expected result is Negative.  Fact Sheet for Patients:  BloggerCourse.com  Fact Sheet for Healthcare Providers:  [employer]ious[employer]ker.it  This test is no t yet approved or cleared by the [place] FDA and  has been authorized for detection and/or diagnosis of SARS-CoV-2 by FDA under an Emergency Use Authorization (EUA). This EUA will remain  in effect (meaning this test can be used) for the duration of the COVID-19 declaration under Section 564(b)(1) of the Act, 21 U.S.C.section 360bbb-3(b)(1), unless the authorization is terminated  or revoked sooner.       Influenza A by PCR NEGATIVE NEGATIVE Final   Influenza B by PCR NEGATIVE NEGATIVE Final    Comment: (NOTE) The Xpert Xpress SARS-CoV-2/FLU/RSV plus assay is intended as an aid in the diagnosis of influenza from Nasopharyngeal swab specimens and should not be used as a sole basis for treatment. Nasal washings and aspirates are unacceptable for Xpert Xpress SARS-CoV-2/FLU/RSV testing.  Fact Sheet for Patients: BloggerCourse.com  Fact Sheet for Healthcare Providers: SeriousBroker.it  This test is not yet approved or cleared by the [place] FDA and has been authorized for detection and/or  diagnosis of SARS-CoV-2 by FDA under an Emergency Use Authorization (EUA). This EUA will remain in effect (meaning this test can be used) for the duration of the COVID-19 declaration under Section 564(b)(1) of the Act, 21 U.S.C. section 360bbb-3(b)(1), unless the authorization is terminated or revoked.  Performed at [facility], [address] [place], [place] [address]  Blood culture (routine x 2)     Status: None (Preliminary result)   Collection Time: 09/18/20  4:52 PM   Specimen: BLOOD  Result Value Ref Range Status   Specimen Description BLOOD LEFT ANTECUBITAL  Final   Special Requests   Final    BOTTLES DRAWN AEROBIC AND ANAEROBIC Blood Culture results may not be optimal due to an inadequate volume of blood received in culture bottles   Culture   Final    NO GROWTH 3 DAYS Performed at [facility], [address], [place], [place] [address]    Report Status PENDING  Incomplete  Urine culture     Status: None   Collection Time: 09/18/20  4:52 PM   Specimen: In/Out Cath Urine  Result Value Ref Range Status   Specimen Description   Final    IN/OUT CATH URINE Performed at [facility], [address], [place], [place] [address]    Special Requests   Final    NONE Performed at [facility], [address] [place], [place] [address]    Culture   Final    NO GROWTH Performed at [facility], [address], [place], [place] [address]    Report Status 09/20/2020 FINAL  Final  Blood culture (routine x 2)     Status: None (Preliminary result)   Collection Time: 09/18/20  5:03 PM   Specimen: BLOOD  Result Value Ref Range Status   Specimen Description BLOOD BLOOD LEFT HAND  Final   Special Requests   Final    BOTTLES DRAWN AEROBIC AND ANAEROBIC Blood Culture adequate volume   Culture   Final    NO GROWTH 3 DAYS Performed at [facility], [address] [place], [place] [address]    Report Status PENDING   Incomplete    Procedures/Studies: CT Angio Head W or Wo Contrast  Result Date: 09/18/2020 CLINICAL DATA:  Dizziness.  Blunt right pupil. EXAM: CT ANGIOGRAPHY HEAD TECHNIQUE: Multidetector CT imaging of the head was performed using the standard protocol during bolus administration of intravenous contrast. Multiplanar CT image reconstructions and MIPs were obtained to evaluate the vascular anatomy. CONTRAST:  72mL OMNIPAQUE IOHEXOL 350 MG/ML SOLN COMPARISON:  CT head August 23, 2019 FINDINGS: CT HEAD Brain: No evidence of acute large vascular territory infarction, hemorrhage, hydrocephalus, extra-axial collection or mass lesion/mass effect. Skull: No acute fracture. Sinuses: Opacified left maxillary sinus with frothy secretions. Scattered ethmoid air cell mucosal thickening opacification. Unremarkable orbits. Orbits: No mastoid effusions. CTA HEAD Anterior circulation: No large vessel occlusion or proximal hemodynamically significant stenosis, aneurysm, or vascular malformation. Early bifurcation of the left MCA. Dominant left A1 ACA. Multifocal mild narrowing of the smaller right ACA. No aneurysm identified. Small right PCOM infundibulum. Posterior circulation: No large vessel occlusion or proximal hemodynamically significant stenosis, aneurysm, or vascular malformation. Venous sinuses: As permitted by contrast timing, patent. IMPRESSION: 1. No evidence of acute intracranial abnormality on the noncontrast head CT. 2. No evidence of emergent large vessel occlusion or proximal hemodynamically significant stenosis. 3. Nonspecific paranasal sinus disease. Correlate with signs/symptoms of sinusitis. Electronically Signed   By: Feliberto Harts MD   On: 09/18/2020 16:06   DG Chest 1 View  Result Date: 09/20/2020 CLINICAL DATA:  Overdose, tobacco abuse EXAM: CHEST  1 VIEW COMPARISON:  09/18/2020 FINDINGS: The heart size and mediastinal contours are within normal limits. Both lungs are clear. The visualized  skeletal structures are unremarkable. IMPRESSION: No active disease. Electronically Signed  By: Sharlet SalinaMichael  Brown M.D.   On: 09/20/2020 17:31   DG Chest 1 View  Result Date: 09/18/2020 CLINICAL DATA:  Tachypnea EXAM: CHEST  1 VIEW COMPARISON:  07/02/2020 FINDINGS: The heart size and mediastinal contours are within normal limits. Both lungs are clear. The visualized skeletal structures are unremarkable. IMPRESSION: No active disease. Electronically Signed   By: Jasmine PangKim  Fujinaga M.D.   On: 09/18/2020 16:32   CT Head Wo Contrast  Result Date: 09/20/2020 CLINICAL DATA:  45 year old male with head trauma. EXAM: CT HEAD WITHOUT CONTRAST TECHNIQUE: Contiguous axial images were obtained from the base of the skull through the vertex without intravenous contrast. COMPARISON:  Head CT dated 09/18/2020. FINDINGS: Brain: The ventricles and sulci appropriate size for patient's age. The gray-white matter discrimination is preserved. There is no acute intracranial hemorrhage. No mass effect midline shift no extra-axial fluid collection. Vascular: No hyperdense vessel or unexpected calcification. Skull: Normal. Negative for fracture or focal lesion. Sinuses/Orbits: There is diffuse mucoperiosteal thickening of paranasal sinuses. No air-fluid level. The mastoid air cells are clear. Other: None IMPRESSION: 1. No acute intracranial pathology. 2. Paranasal sinus disease. Electronically Signed   By: Elgie CollardArash  Radparvar M.D.   On: 09/20/2020 19:49     Time coordinating discharge: Over 30 minutes    Lewie Chamberavid Taralynn Quiett, MD  [facility] 09/21/2020, 3:32 PM

## 2020-09-21 NOTE — ED Notes (Signed)
Hourly rounding completed at this time, patient currently awake in hallwy bed. No complaints, stable, and in no acute distress. Q15 minute rounds and monitoring via Psychologist, counselling to continue.

## 2020-09-21 NOTE — ED Notes (Signed)
Hourly rounding completed at this time, patient currently awake in hallway bed. No complaints, stable, and in no acute distress. Q15 minute rounds and monitoring via Rover and Officer to continue. 

## 2020-09-21 NOTE — ED Notes (Signed)
Hourly rounding completed at this time, patient currently awake in hallway. No complaints, stable, and in no acute distress. Q15 minute rounds and monitoring via Psychologist, counselling to continue.

## 2020-09-21 NOTE — ED Notes (Signed)
Pt has removed his IV on own, no bleeding noted

## 2020-09-21 NOTE — ED Notes (Signed)
Pt mother called at this time, ride is on the way for pt

## 2020-09-21 NOTE — ED Notes (Signed)
Pt has reached his mother on phone, she is unable to pick him up due to not driving but pt can go to mothers house. Attempting to find ride.

## 2020-09-21 NOTE — ED Notes (Addendum)
Pt has continued to attempt to reach individuals for transport home. Pt unsure of phone numbers. Due to pt actions it is determined by this nurse, Dr. York Cerise, and Charge RN that pt is not safe to go to lobby on own due to fear of pt trying to ambulate on own home and in this temperature (below freezing outside). Pt provided with snack and drink now.

## 2020-09-23 LAB — VOLATILES,BLD-ACETONE,ETHANOL,ISOPROP,METHANOL
Acetone, blood: <0.01 g/dL (ref 0.000–0.010)
Ethanol, blood: <0.01 g/dL (ref 0.000–0.010)
Isopropanol, blood: <0.01 g/dL (ref 0.000–0.010)
Methanol, blood: <0.01 g/dL (ref 0.000–0.010)

## 2020-09-23 LAB — CULTURE, BLOOD (ROUTINE X 2)
Culture: NO GROWTH
Culture: NO GROWTH
Special Requests: ADEQUATE

## 2021-12-16 ENCOUNTER — Emergency Department: Payer: Self-pay

## 2021-12-16 ENCOUNTER — Inpatient Hospital Stay
Admission: EM | Admit: 2021-12-16 | Discharge: 2021-12-21 | DRG: 177 | Disposition: A | Discharge status: Home / Self Care | Payer: Self-pay | Attending: Internal Medicine | Admitting: Internal Medicine

## 2021-12-16 DIAGNOSIS — J9601 Acute respiratory failure with hypoxia: Secondary | ICD-10-CM | POA: Diagnosis present

## 2021-12-16 DIAGNOSIS — Z885 Allergy status to narcotic agent status: Secondary | ICD-10-CM

## 2021-12-16 DIAGNOSIS — R4182 Altered mental status, unspecified: Secondary | ICD-10-CM | POA: Diagnosis present

## 2021-12-16 DIAGNOSIS — J690 Pneumonitis due to inhalation of food and vomit: Principal | ICD-10-CM | POA: Diagnosis present

## 2021-12-16 DIAGNOSIS — M546 Pain in thoracic spine: Secondary | ICD-10-CM | POA: Diagnosis present

## 2021-12-16 DIAGNOSIS — J45901 Unspecified asthma with (acute) exacerbation: Secondary | ICD-10-CM | POA: Diagnosis present

## 2021-12-16 DIAGNOSIS — M940 Chondrocostal junction syndrome [Tietze]: Secondary | ICD-10-CM | POA: Diagnosis present

## 2021-12-16 DIAGNOSIS — Z809 Family history of malignant neoplasm, unspecified: Secondary | ICD-10-CM

## 2021-12-16 DIAGNOSIS — Z20822 Contact with and (suspected) exposure to covid-19: Secondary | ICD-10-CM | POA: Diagnosis present

## 2021-12-16 DIAGNOSIS — R55 Syncope and collapse: Secondary | ICD-10-CM | POA: Diagnosis present

## 2021-12-16 DIAGNOSIS — Z833 Family history of diabetes mellitus: Secondary | ICD-10-CM

## 2021-12-16 DIAGNOSIS — Z720 Tobacco use: Secondary | ICD-10-CM | POA: Diagnosis present

## 2021-12-16 DIAGNOSIS — J45909 Unspecified asthma, uncomplicated: Secondary | ICD-10-CM | POA: Diagnosis present

## 2021-12-16 DIAGNOSIS — Z86711 Personal history of pulmonary embolism: Secondary | ICD-10-CM

## 2021-12-16 DIAGNOSIS — G8929 Other chronic pain: Secondary | ICD-10-CM | POA: Diagnosis present

## 2021-12-16 DIAGNOSIS — N179 Acute kidney failure, unspecified: Secondary | ICD-10-CM | POA: Diagnosis present

## 2021-12-16 DIAGNOSIS — F1510 Other stimulant abuse, uncomplicated: Secondary | ICD-10-CM | POA: Diagnosis present

## 2021-12-16 DIAGNOSIS — Z87891 Personal history of nicotine dependence: Secondary | ICD-10-CM

## 2021-12-16 HISTORY — DX: Altered mental status, unspecified: R41.82

## 2021-12-16 HISTORY — DX: Acute kidney failure, unspecified: N17.9

## 2021-12-16 LAB — CBC WITH DIFFERENTIAL/PLATELET
Abs Immature Granulocytes: 0.03 10*3/uL (ref 0.00–0.07)
Basophils Absolute: 0.1 10*3/uL (ref 0.0–0.1)
Basophils Relative: 1 %
Eosinophils Absolute: 0.3 10*3/uL (ref 0.0–0.5)
Eosinophils Relative: 3 %
HCT: 36.7 % — ABNORMAL LOW (ref 39.0–52.0)
Hemoglobin: 11.3 g/dL — ABNORMAL LOW (ref 13.0–17.0)
Immature Granulocytes: 0 %
Lymphocytes Relative: 30 %
Lymphs Abs: 2.3 10*3/uL (ref 0.7–4.0)
MCH: 27.4 pg (ref 26.0–34.0)
MCHC: 30.8 g/dL (ref 30.0–36.0)
MCV: 89.1 fL (ref 80.0–100.0)
Monocytes Absolute: 0.8 10*3/uL (ref 0.1–1.0)
Monocytes Relative: 10 %
Neutro Abs: 4.5 10*3/uL (ref 1.7–7.7)
Neutrophils Relative %: 56 %
Platelets: 318 10*3/uL (ref 150–400)
RBC: 4.12 MIL/uL — ABNORMAL LOW (ref 4.22–5.81)
RDW: 15.5 % (ref 11.5–15.5)
WBC: 7.9 10*3/uL (ref 4.0–10.5)
nRBC: 0 % (ref 0.0–0.2)

## 2021-12-16 LAB — COMPREHENSIVE METABOLIC PANEL
ALT: 43 U/L (ref 0–44)
AST: 74 U/L — ABNORMAL HIGH (ref 15–41)
Albumin: 3.7 g/dL (ref 3.5–5.0)
Alkaline Phosphatase: 82 U/L (ref 38–126)
Anion gap: 13 (ref 5–15)
BUN: 24 mg/dL — ABNORMAL HIGH (ref 6–20)
CO2: 22 mmol/L (ref 22–32)
Calcium: 8.5 mg/dL — ABNORMAL LOW (ref 8.9–10.3)
Chloride: 102 mmol/L (ref 98–111)
Creatinine, Ser: 1.7 mg/dL — ABNORMAL HIGH (ref 0.61–1.24)
GFR, Estimated: 50 mL/min — ABNORMAL LOW (ref >60)
Glucose, Bld: 196 mg/dL — ABNORMAL HIGH (ref 70–99)
Potassium: 5 mmol/L (ref 3.5–5.1)
Sodium: 137 mmol/L (ref 135–145)
Total Bilirubin: 1 mg/dL (ref 0.3–1.2)
Total Protein: 7.5 g/dL (ref 6.5–8.1)

## 2021-12-16 LAB — RESP PANEL BY RT-PCR (FLU A&B, COVID) ARPGX2
Influenza A by PCR: NEGATIVE
Influenza B by PCR: NEGATIVE
SARS Coronavirus 2 by RT PCR: NEGATIVE

## 2021-12-16 LAB — BLOOD GAS, ARTERIAL
Acid-base deficit: 3.9 mmol/L — ABNORMAL HIGH (ref 0.0–2.0)
Bicarbonate: 22.2 mmol/L (ref 20.0–28.0)
O2 Saturation: 94.4 %
Patient temperature: 37
pCO2 arterial: 43 mmHg (ref 32–48)
pH, Arterial: 7.32 — ABNORMAL LOW (ref 7.35–7.45)
pO2, Arterial: 65 mmHg — ABNORMAL LOW (ref 83–108)

## 2021-12-16 LAB — TROPONIN I (HIGH SENSITIVITY)
Troponin I (High Sensitivity): 25 ng/L — ABNORMAL HIGH (ref <18)
Troponin I (High Sensitivity): 28 ng/L — ABNORMAL HIGH (ref <18)

## 2021-12-16 LAB — PROCALCITONIN: Procalcitonin: <0.1 ng/mL

## 2021-12-16 MED ORDER — IPRATROPIUM-ALBUTEROL 0.5-2.5 (3) MG/3ML IN SOLN
3.0000 mL | Freq: Once | RESPIRATORY_TRACT | Status: AC
  Administered 2021-12-16: 3 mL via RESPIRATORY_TRACT
  Filled 2021-12-16: qty 3

## 2021-12-16 MED ORDER — IOHEXOL 350 MG/ML SOLN
100.0000 mL | Freq: Once | INTRAVENOUS | Status: AC | PRN
Start: 2021-12-16 — End: 2021-12-16
  Administered 2021-12-16: 100 mL via INTRAVENOUS

## 2021-12-16 MED ORDER — NALOXONE HCL 2 MG/2ML IJ SOSY: 0.4000 mg | PREFILLED_SYRINGE | Freq: Once | INTRAMUSCULAR | Status: DC

## 2021-12-16 MED ORDER — IPRATROPIUM-ALBUTEROL 0.5-2.5 (3) MG/3ML IN SOLN
3.0000 mL | Freq: Once | RESPIRATORY_TRACT | Status: AC
  Administered 2021-12-16: 3 mL via RESPIRATORY_TRACT

## 2021-12-16 MED ORDER — LIDOCAINE 5 % EX PTCH
1.0000 | MEDICATED_PATCH | CUTANEOUS | Status: DC
  Administered 2021-12-16 – 2021-12-20 (×5): 1 via TRANSDERMAL
  Filled 2021-12-16 (×6): qty 1

## 2021-12-16 MED ORDER — NALOXONE HCL 2 MG/2ML IJ SOSY
0.4000 mg | PREFILLED_SYRINGE | INTRAMUSCULAR | Status: DC | PRN
  Filled 2021-12-16: qty 2

## 2021-12-16 MED ORDER — SODIUM CHLORIDE 0.9 % IV SOLN
500.0000 mg | Freq: Once | INTRAVENOUS | Status: AC
  Administered 2021-12-16: 500 mg via INTRAVENOUS
  Filled 2021-12-16: qty 5

## 2021-12-16 MED ORDER — ONDANSETRON HCL 4 MG PO TABS: 4.0000 mg | ORAL_TABLET | Freq: Four times a day (QID) | ORAL | Status: DC | PRN

## 2021-12-16 MED ORDER — SODIUM CHLORIDE 0.9 % IV SOLN
1.0000 g | Freq: Once | INTRAVENOUS | Status: AC
  Administered 2021-12-16: 1 g via INTRAVENOUS
  Filled 2021-12-16: qty 10

## 2021-12-16 MED ORDER — METHYLPREDNISOLONE SODIUM SUCC 40 MG IJ SOLR
40.0000 mg | Freq: Two times a day (BID) | INTRAMUSCULAR | Status: AC
  Administered 2021-12-17 (×2): 40 mg via INTRAVENOUS
  Filled 2021-12-16 (×2): qty 1

## 2021-12-16 MED ORDER — KETOROLAC TROMETHAMINE 15 MG/ML IJ SOLN
7.5000 mg | Freq: Four times a day (QID) | INTRAMUSCULAR | Status: AC | PRN
  Administered 2021-12-16 – 2021-12-17 (×3): 7.5 mg via INTRAVENOUS
  Filled 2021-12-16 (×3): qty 1

## 2021-12-16 MED ORDER — METHYLPREDNISOLONE SODIUM SUCC 125 MG IJ SOLR
125.0000 mg | Freq: Once | INTRAMUSCULAR | Status: AC
  Administered 2021-12-16: 125 mg via INTRAVENOUS
  Filled 2021-12-16: qty 2

## 2021-12-16 MED ORDER — ONDANSETRON HCL 4 MG/2ML IJ SOLN: 4.0000 mg | Freq: Four times a day (QID) | INTRAMUSCULAR | Status: DC | PRN

## 2021-12-16 MED ORDER — ACETAMINOPHEN 650 MG RE SUPP: 650.0000 mg | Freq: Four times a day (QID) | RECTAL | Status: AC | PRN

## 2021-12-16 MED ORDER — ACETAMINOPHEN 325 MG PO TABS: 650.0000 mg | ORAL_TABLET | Freq: Four times a day (QID) | ORAL | Status: AC | PRN

## 2021-12-16 MED ORDER — NICOTINE 14 MG/24HR TD PT24: 14.0000 mg | MEDICATED_PATCH | Freq: Every day | TRANSDERMAL | Status: DC | PRN

## 2021-12-16 MED ORDER — SODIUM CHLORIDE 0.9 % IV SOLN: INTRAVENOUS | Status: DC | PRN

## 2021-12-16 MED ORDER — OXYCODONE-ACETAMINOPHEN 5-325 MG PO TABS
1.0000 | ORAL_TABLET | Freq: Three times a day (TID) | ORAL | Status: AC | PRN
  Administered 2021-12-16 – 2021-12-17 (×2): 1 via ORAL
  Filled 2021-12-16 (×2): qty 1

## 2021-12-16 MED ORDER — ENOXAPARIN SODIUM 60 MG/0.6ML IJ SOSY
0.5000 mg/kg | PREFILLED_SYRINGE | Freq: Every day | INTRAMUSCULAR | Status: DC
  Administered 2021-12-17 – 2021-12-21 (×5): 52.5 mg via SUBCUTANEOUS
  Filled 2021-12-16 (×5): qty 0.6

## 2021-12-16 MED ORDER — SODIUM CHLORIDE 0.9 % IV SOLN: INTRAVENOUS | Status: AC

## 2021-12-16 MED ORDER — LACTATED RINGERS IV BOLUS
1000.0000 mL | Freq: Once | INTRAVENOUS | Status: AC
  Administered 2021-12-16: 1000 mL via INTRAVENOUS

## 2021-12-16 MED ORDER — IPRATROPIUM-ALBUTEROL 0.5-2.5 (3) MG/3ML IN SOLN
3.0000 mL | Freq: Once | RESPIRATORY_TRACT | Status: AC
  Administered 2021-12-16: 3 mL via RESPIRATORY_TRACT
  Filled 2021-12-16: qty 6

## 2021-12-16 NOTE — Assessment & Plan Note (Addendum)
Initially with O2 sat of 81%, required 15 L/min O2 via nonrebreather mask in the ED.  Respiratory status is improved, weaned to room air today (4/20). ?-- Treat pneumonia as outlined ?-- Supplemental oxygen as needed to maintain sats above 90% ?

## 2021-12-16 NOTE — Progress Notes (Signed)
PHARMACIST - PHYSICIAN COMMUNICATION ? ?CONCERNING:  Enoxaparin (Lovenox) for DVT Prophylaxis  ? ? ?RECOMMENDATION: ?Patient was prescribed enoxaprin 40mg  q24 hours for VTE prophylaxis.  ? ?Filed Weights  ? 12/16/21 2245  ?Weight: 104.3 kg (230 lb)  ? ? ?Body mass index is 32.08 kg/m?. ? ?Estimated Creatinine Clearance: 68.2 mL/min (A) (by C-G formula based on SCr of 1.7 mg/dL (H)). ? ? ?Based on [facility] policy patient is candidate for enoxaparin 0.5mg /kg TBW SQ every 24 hours based on BMI being >30. ? ?DESCRIPTION: ?Pharmacy has adjusted enoxaparin dose per [facility] policy. ? ?Patient is now receiving enoxaparin 0.5 mg/kg every 24 hours  ? ?[facility], PharmD, MBA ?12/16/2021 ?11:22 PM ? ? ?

## 2021-12-16 NOTE — Assessment & Plan Note (Addendum)
Creatinine on admission 1.70, up from baseline 0.77-1.1.  Likely prerenal azotemia, renal function improved with fluids. ?--Stop IV fluids ?--Monitor BMP ? ?Cr: 1.70 >>1.25 >> 0.87 ?

## 2021-12-16 NOTE — Assessment & Plan Note (Addendum)
Right anterior rib pain, appears MSK etiology vs related to PNA and couging.  Seems in part chronic.   ?- Lidocaine patch ordered ?- Percocet PRN ?

## 2021-12-16 NOTE — ED Notes (Signed)
RT at bedside obtaining ABG

## 2021-12-16 NOTE — ED Notes (Signed)
Pt transported to CT via stretcher.  

## 2021-12-16 NOTE — H&P (Addendum)
4 days ago ?History and Physical  ? ?David Leach WUJ:[id number] DOB: 05/02/1976 DOA: 12/16/2021 ? ?[facility]  ?Patient coming from: Worksite via EMS ? ?I have personally briefly reviewed patient's old medical records [facility]. ? ?Chief Concern: Altered mental status/syncope ? ?HPI: [name] is a 46 year old male with history of polysubstance abuse, odynophagia, history of DVT, history of drug overdose, history of PE, who presents emergency department for chief concerns of syncope. ? ?Per ED provider, patient received Narcan via EMS and woke up.  Patient has just been released from jail 4 days ago. ? ?Per ED triage notes: Patient received 0.5 mg Narcan on route which helped him wake up.  Initial SPO2 with 81% on 15 L nonrebreather. ? ?Initial vitals in the emergency department showed temperature 97.9, respiration rate of 21 and improved to 13, initial heart rate of 134 now 107, blood pressure 129/90, satting at 95% on 10 L nonrebreather. ? ?Imaging: CT the head without contrast: No acute intracranial process.  Mild to moderate severity bilateral ethmoid sinus and bilateral maxillary sinus disease. ?CT cervical spine without contrast: No acute fracture or subluxation in the cervical spine.  Mild degenerative changes at the levels of C4-C5, C5-C6 and C6-C7.  Straightening of normal cervical spine lordosis, which may represent muscle spasm. ? ?Chest x-ray: No evidence of pneumothorax.  Improved aeration of the lungs, with decrease central vascular congestion. ? ?CTA PE: Was negative for PE.  Distended fluid-filled stomach with fluid in thoracic esophagus consistent with reflux or emesis.  Bilateral dependent groundglass airspace disease highly concerning for aspiration. ? ?ED treatment: DuoNebs x2, Solu-Medrol 125 mg IV, azithromycin 500 mg IV, ceftriaxone 1 g IV, LR 1 L bolus. ? ?At bedside, he is able to tell me his name, age, and he knows he is in the hospital.  ? ?He states that  he has been released about 4 days ago. 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 He states he was not wearing a mask. He states he has been having persistent chest pain. He denies new shortness of breath. He endorses right sided tenderness and denies trauma.  He states the pain is worse with movement and persistent.  He states the right sided tenderness has been ongoing for three weeks. He states that in prison, they have been giving him steroids and breathing treatments.  ? ?He states he has been coughing. He states the cough is non-productive.  ? ?Social history: He is a former tobacco user. He quit 1 year and 4 months ago. He smoked 2 ppd formerly. He endorses etoh use, last drink was Saturday, 40 oz of beer. He denies recreational drug use use.  ? ?Vaccination history: he is vaccinated for covid and influenza ? ?ROS: ?Constitutional: no weight change, no fever ?ENT/Mouth: no sore throat, no rhinorrhea ?Eyes: no eye pain, no vision changes ?Cardiovascular: no chest pain, no dyspnea,  no edema, no palpitations ?Respiratory: no cough, no sputum, no wheezing ?Gastrointestinal: no nausea, no vomiting, no diarrhea, no constipation ?Genitourinary: no urinary incontinence, no dysuria, no hematuria ?Musculoskeletal: no arthralgias, no myalgias ?Skin: no skin lesions, no pruritus, ?Neuro: + weakness, no loss of consciousness, no syncope ?Psych: no anxiety, no depression, + decrease appetite ?Heme/Lymph: no bruising, no bleeding ? ?ED Course: Discussed with emergency medicine provider, patient requiring hospitalization for chief concerns of acute respiratory failure with hypoxia. ? ?Assessment/Plan ? ?Principal Problem: ?  Altered mental status ?  Active Problems: ?  Tobacco abuse ?  Amphetamine abuse (HCC) ?  Acute respiratory failure with hypoxia (HCC) ?  AKI (acute kidney injury) (HCC) ?  Chronic right-sided thoracic back pain ?  Syncope ?  ?Assessment and Plan: ? ?* Altered mental  status ?With syncope ?- Etiology work-up in progress, suspect overdose ?- Aspiration and fall precautions ?- Maintain n.p.o. except for sips with meds and ice chips ?- Check procalcitonin, UDS, ethanol ? ?Chronic right-sided thoracic back pain ?- Right anterior rib pain, query musculoskeletal ?- Lidocaine patch ordered ?- Toradol 7.5 mg IV every 6 hours as needed for moderate pain, 1 day ordered; oxycodone-acetaminophen, 1 tablet, every 8 hours as needed for severe pain, 2 doses ordered ?- Judicious and conservative pain management on admission given that patient presented for acute hypoxic respiratory failure ?- AM team to reassess pain ? ?AKI (acute kidney injury) (HCC) ?- Based on labs from 1 year ago and no prior history of CKD ?- Baseline serum creatinine has been 0.77-1.11/GFR greater than 60 ?- Serum creatinine on presentation is 1.70, GFR of 50 ?- Sodium chloride 125 mL/h, 1 day ordered ?- BMP in a.m. ? ?Acute respiratory failure with hypoxia (HCC) ?- Continue nonrebreather to maintain SPO2 greater than 92% ?- Admit to progressive, observation ? ?Amphetamine abuse (HCC) ?- UDS positive for amphetamine on 10/10/2020 ? ?Tobacco abuse ?- Nicotine patch as needed for nicotine craving ordered ? ?Chart reviewed.  ? ?DVT prophylaxis: Enoxaparin ?Code Status: Full code ?Diet: Heart healthy ?Family Communication: Mother over the phone via patient's video call ?Disposition Plan: Pending clinical course ?Consults called: None at this time ?Admission status: Progressive cardiac, observation ? ?Past Medical History:  ?Diagnosis Date  ? Asthma   ? Chronic dental pain   ? Chronic shoulder pain   ? Drug abuse (HCC)   ? DVT (deep venous thrombosis) (HCC)   ? Headache   ? Pulmonary embolism (HCC)   ? Right-sided back pain   ? ?Past Surgical History:  ?Procedure Laterality Date  ? TONSILLECTOMY    ? ?Social History:  reports that he has quit smoking. His smoking use included cigarettes. He has a 20.00 pack-year smoking history.  He has quit using smokeless tobacco.  His smokeless tobacco use included chew. He reports current alcohol use. He reports current drug use. Drugs: Methamphetamines and Cocaine. ? ?Allergies  ?Allergen Reactions  ? Hydrocodone Rash and Itching  ? ?Family History  ?Problem Relation Age of Onset  ? Cancer Mother   ? Diabetes Father   ? Cancer Other   ? Stroke Other   ? Diabetes Other   ? ?Family history: Family history reviewed and not pertinent ? ?Prior to Admission medications   ?Not on File  ? ?Physical Exam: ?Vitals:  ? 12/16/21 2100 12/16/21 2130 12/16/21 2200 12/16/21 2245  ?BP: (!) 145/85 (!) 150/74 (!) 136/99   ?Pulse: (!) 114 99 (!) 109   ?Resp: 13 19 19    ?Temp:      ?TempSrc:      ?SpO2: 97% 99% 96%   ?Weight:    104.3 kg  ?Height:    5\' 11"  (1.803 m)  ? ?Constitutional: appears older than chronological age, NAD, calm, comfortable ?Eyes: PERRL, lids and conjunctivae normal ?ENMT: Mucous membranes are moist. Posterior pharynx clear of any exudate or lesions. Age-appropriate dentition. Hearing appropriate ?Neck: normal, supple, no masses, no thyromegaly ?Respiratory: clear to auscultation bilaterally.diffuse wheezing. no crackles. Normal respiratory effort. No accessory muscle use.  ?Cardiovascular: Regular rate  and rhythm, no murmurs / rubs / gallops. No extremity edema. 2+ pedal pulses. No carotid bruits.  ?Abdomen: Obese abdomen, no tenderness, no masses palpated, no hepatosplenomegaly. Bowel sounds positive.  ?Musculoskeletal: no clubbing / cyanosis. No joint deformity upper and lower extremities. Good ROM, no contractures, no atrophy. Normal muscle tone.  ?Skin: no rashes, lesions, ulcers. No induration ?Neurologic: Sensation intact. Strength 5/5 in all 4.  ?Psychiatric: Normal judgment and insight. Alert and oriented x 3. Normal mood.  ? ?EKG: independently reviewed, showing sinus tachycardia with rate of 109, QTc 449 ? ?Chest x-ray on Admission: I personally reviewed and I agree with radiologist reading  as below. ? ?DG Chest 2 View ? ?Result Date: 12/16/2021 ?CLINICAL DATA:  Possible pneumothorax on previous exam, history of wheezing EXAM: CHEST - 2 VIEW COMPARISON:  None. FINDINGS: Frontal and lateral

## 2021-12-16 NOTE — ED Notes (Signed)
Patient has been in xray and/or CT since 1920. ?

## 2021-12-16 NOTE — Assessment & Plan Note (Addendum)
Per chart review, UDS positive for amphetamine on 10/10/2020. ?UDS obtained on admission positive for cocaine. ?

## 2021-12-16 NOTE — ED Triage Notes (Signed)
Pt BIB [facility] from work. No report received from EMS. Per patient, he has a hx of asthma. 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 Pt making repetitive statements during triage. MD at bedside during triage. SaO2 81% 15L NRB.  ?

## 2021-12-16 NOTE — ED Provider Notes (Signed)
? ?[facility] ?Provider Note ? ? ? Event Date/Time  ? First MD Initiated Contact with Patient 12/16/21 1759   ?  (approximate) ? ? ?History  ? ?No chief complaint on file. ? ? ?HPI ? ?David Leach is a 46 y.o. male  with history of asthma, polysubstance abuse and as listed in EMR presents to the emergency department for treatment and evaluation of respiratory distress. Limited report from EMS. Found unresponsive and given Narcan with immediate response. Now awake and oriented to place and year.  Repetitive speech. ? ?  ? ? ?Physical Exam  ? ?Triage Vital Signs: ?ED Triage Vitals  ?Enc Vitals Group  ?   BP 146/113  ?   Pulse 105  ?   Resp 22  ?   Temp 97.9  ?   Temp src   ?   SpO2 100  ?   Weight   ?   Height   ?   Head Circumference   ?   Peak Flow   ?   Pain Score   ?   Pain Loc   ?   Pain Edu?   ?   Excl. in [place]?   ? ? ?Most recent vital signs: ?Vitals:  ? 12/16/21 2130 12/16/21 2200  ?BP: (!) 150/74 (!) 136/99  ?Pulse: 99 (!) 109  ?Resp: 19 19  ?Temp:    ?SpO2: 99% 96%  ? ? ?General: Awake, respiratory distress, repetitive speech.  ?CV:  Good peripheral perfusion. ?Resp:  Increased respiratory effort. Poor air movement. Diffuse wheezing. ?Abd:  No distention.  ?Other:  Right side lateral rib/chest pain. ?  Pupils unequal.  ?                       Abrasion to forehead ? ? ?ED Results / Procedures / Treatments  ? ?Labs ?(all labs ordered are listed, but only abnormal results are displayed) ?Labs Reviewed  ?COMPREHENSIVE METABOLIC PANEL - Abnormal; Notable for the following components:  ?    Result Value  ? Glucose, Bld 196 (*)   ? BUN 24 (*)   ? Creatinine, Ser 1.70 (*)   ? Calcium 8.5 (*)   ? AST 74 (*)   ? GFR, Estimated 50 (*)   ? All other components within normal limits  ?CBC WITH DIFFERENTIAL/PLATELET - Abnormal; Notable for the following components:  ? RBC 4.12 (*)   ? Hemoglobin 11.3 (*)   ? HCT 36.7 (*)   ? All other components within normal limits  ?BLOOD GAS, ARTERIAL - Abnormal;  Notable for the following components:  ? pH, Arterial 7.32 (*)   ? pO2, Arterial 65 (*)   ? Acid-base deficit 3.9 (*)   ? All other components within normal limits  ?TROPONIN I (HIGH SENSITIVITY) - Abnormal; Notable for the following components:  ? Troponin I (High Sensitivity) 25 (*)   ? All other components within normal limits  ?TROPONIN I (HIGH SENSITIVITY) - Abnormal; Notable for the following components:  ? Troponin I (High Sensitivity) 28 (*)   ? All other components within normal limits  ?RESP PANEL BY RT-PCR (FLU A&B, COVID) ARPGX2  ?PROCALCITONIN  ?URINE DRUG SCREEN, QUALITATIVE ([facility] ONLY)  ?BASIC METABOLIC PANEL  ?CBC  ?HIV ANTIBODY (ROUTINE TESTING W REFLEX)  ?PROCALCITONIN  ?ETHANOL  ?URINALYSIS, ROUTINE W REFLEX MICROSCOPIC  ? ? ? ?EKG ? ?ED ECG REPORT ?I, Sherrie George, FNP-BC personally viewed and interpreted this ECG. ? ? Date:  12/16/2021 ? EKG Time: 1803 ? Rate: 109 ? Rhythm: sinus tachycardia ? Axis: normal ? Intervals:none ? ST&T Change: no ST elevation ? ? ? ?RADIOLOGY ? ?Image and radiology report reviewed by me. ? ?CT head and cervical spine without acute concerns. ? ?Bilateral dependent groundglass airspace disease concerning for aspiration. ? ?PROCEDURES: ? ?Critical Care performed: No ? ?Procedures ? ? ?MEDICATIONS ORDERED IN ED: ?Medications  ?ipratropium-albuterol (DUONEB) 0.5-2.5 (3) MG/3ML nebulizer solution 3 mL (3 mLs Nebulization Not Given 12/16/21 2122)  ?0.9 %  sodium chloride infusion (0 mLs Intravenous Stopped 12/16/21 2340)  ?acetaminophen (TYLENOL) tablet 650 mg (has no administration in time range)  ?  Or  ?acetaminophen (TYLENOL) suppository 650 mg (has no administration in time range)  ?ondansetron (ZOFRAN) tablet 4 mg (has no administration in time range)  ?  Or  ?ondansetron (ZOFRAN) injection 4 mg (has no administration in time range)  ?enoxaparin (LOVENOX) injection 52.5 mg (has no administration in time range)  ?naloxone [facility]) injection 0.4 mg (has no administration  in time range)  ?nicotine (NICODERM CQ - dosed in mg/24 hours) patch 14 mg (has no administration in time range)  ?0.9 %  sodium chloride infusion ( Intravenous New Bag/Given 12/16/21 2343)  ?ketorolac (TORADOL) 15 MG/ML injection 7.5 mg (7.5 mg Intravenous Given 12/16/21 2344)  ?oxyCODONE-acetaminophen (PERCOCET/ROXICET) 5-325 MG per tablet 1 tablet (1 tablet Oral Given 12/16/21 2340)  ?lidocaine (LIDODERM) 5 % 1 patch (1 patch Transdermal Patch Applied 12/16/21 2338)  ?methylPREDNISolone sodium succinate (SOLU-MEDROL) 40 mg/mL injection 40 mg (has no administration in time range)  ?methylPREDNISolone sodium succinate (SOLU-MEDROL) 125 mg/2 mL injection 125 mg (125 mg Intravenous Given 12/16/21 1835)  ?ipratropium-albuterol (DUONEB) 0.5-2.5 (3) MG/3ML nebulizer solution 3 mL (3 mLs Nebulization Given 12/16/21 1835)  ?ipratropium-albuterol (DUONEB) 0.5-2.5 (3) MG/3ML nebulizer solution 3 mL (3 mLs Nebulization Given 12/16/21 1835)  ?iohexol (OMNIPAQUE) 350 MG/ML injection 100 mL (100 mLs Intravenous Contrast Given 12/16/21 1950)  ?lactated ringers bolus 1,000 mL (0 mLs Intravenous Stopped 12/16/21 2139)  ?cefTRIAXone (ROCEPHIN) 1 g in sodium chloride 0.9 % 100 mL IVPB (0 g Intravenous Stopped 12/16/21 2138)  ?azithromycin (ZITHROMAX) 500 mg in sodium chloride 0.9 % 250 mL IVPB (0 mg Intravenous Stopped 12/16/21 2329)  ? ? ? ?IMPRESSION / MDM / ASSESSMENT AND PLAN / ED COURSE  ? ?I have reviewed the triage note. ? ?Differential diagnosis includes, but is not limited to: Head injury, COPD exacerbation, inhalation effect, polysubstance abuse ? ?The patient is on the cardiac monitor to evaluate for evidence of arrhythmia and/or significant heart rate changes. ? ?46 year old male presenting to the emergency department with EMS after being found unresponsive.  Per EMS Narcan was given with immediate response.  No other information regarding prehospital event available. ? ?Patient is awake, alert and is able to state his name, where  he is, and the year.  He repetitively states that he has been sick for weeks and he has pain in his right rib.  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  He is unable to provide any other information surrounding this event.  Oxygen saturation in the [id number] prior to application of the nonrebreather.  After nonrebreather was applied, oxygen saturations mid 90s. ? ?Chest x-ray is negative for acute cardiopulmonary abnormality.  CT of the head and cervical spine are also negative for acute findings.  Patient continues to be repetitive and complaining of right lateral chest  wall pain.  He is noted to be tachycardic and remains short of breath after Solu-Medrol, 2 DuoNebs, and oxygen.  Plan will be to get a CTA chest to rule out PE.  Lab studies are pending. ? ?CTA for PE shows groundglass airspace disease concerning for aspiration.  He also has distended fluid-filled stomach with fluid in thoracic esophagus consistent for reflux or emesis.  No evidence of PE. ? ?Patient evaluated by ED attending, Dr. Vladimir Crofts.  ? ?Patient remains altered. Oxygen saturation upper 80s, which is likely his baseling. Patient to be admitted via hospitalist services. ? ?CRITICAL CARE ?Performed by: Sherrie George ? ? ?Total critical care time: 40 minutes ? ?Critical care time was exclusive of separately billable procedures and treating other patients. ? ?Critical care was necessary to treat or prevent imminent or life-threatening deterioration. ? ?Critical care was time spent personally by me on the following activities: development of treatment plan with patient and/or surrogate as well as nursing, discussions with consultants, evaluation of patient's response to treatment, examination of patient, obtaining history from patient or surrogate, ordering and performing treatments and interventions, ordering and review of laboratory studies, ordering and review of radiographic studies,  pulse oximetry and re-evaluation of patient's condition. c ?  ? ? ?FINAL CLINICAL IMPRESSION(S) / ED DIAGNOSES  ? ?Final diagnoses:  ?Aspiration pneumonia of both lower lobes, unspecified aspiration pn

## 2021-12-16 NOTE — Hospital Course (Addendum)
Mr. Merit Herpel is a 46 year old male with history of polysubstance abuse, odynophagia, history of DVT, history of drug overdose, history of PE, who presented to the ED on 12/16/2021 for evaluation of of syncope. ? ?Per ED provider, patient received Narcan via EMS and woke up.  Patient has just been released from jail 4 days ago.  Initially hypoxic with O2 sat of 81%, required supplemental oxygen 15 L/min via nonrebreather mask.  Otherwise patient was afebrile, tachypneic and tachycardic with stable BP. ? ?CT head, CT cervical spine negative for acute findings. ?CTA chest negative for PE, showed distended fluid-filled stomach with fluid in thoracic esophagus consistent with reflux or emesis.  Bilateral dependent groundglass airspace disease highly concerning for aspiration. ? ?Patient was treated with IV steroids, IV antibiotics and DuoNebs in the ED.  Admitted to hospitalist service for further evaluation management. ? ? ?4/21: Patient reports worsening symptoms including cough, shortness of breath and rib cage pain. ?

## 2021-12-16 NOTE — Assessment & Plan Note (Signed)
-   Nicotine patch as needed for nicotine craving ordered ?

## 2021-12-16 NOTE — Assessment & Plan Note (Addendum)
With syncope.  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  No neurologic deficits.  CT head negative. ?Mental status back to baseline ?--Treat infection as outlined ?- Aspiration and fall precautions ?

## 2021-12-17 ENCOUNTER — Other Ambulatory Visit: Payer: Self-pay

## 2021-12-17 DIAGNOSIS — J690 Pneumonitis due to inhalation of food and vomit: Secondary | ICD-10-CM | POA: Diagnosis present

## 2021-12-17 LAB — URINALYSIS, ROUTINE W REFLEX MICROSCOPIC
Bilirubin Urine: NEGATIVE
Glucose, UA: NEGATIVE mg/dL
Hgb urine dipstick: NEGATIVE
Ketones, ur: NEGATIVE mg/dL
Leukocytes,Ua: NEGATIVE
Nitrite: NEGATIVE
Protein, ur: NEGATIVE mg/dL
Specific Gravity, Urine: 1.018 (ref 1.005–1.030)
pH: 5 (ref 5.0–8.0)

## 2021-12-17 LAB — CBC
HCT: 33.6 % — ABNORMAL LOW (ref 39.0–52.0)
Hemoglobin: 10.7 g/dL — ABNORMAL LOW (ref 13.0–17.0)
MCH: 27.6 pg (ref 26.0–34.0)
MCHC: 31.8 g/dL (ref 30.0–36.0)
MCV: 86.8 fL (ref 80.0–100.0)
Platelets: 259 10*3/uL (ref 150–400)
RBC: 3.87 MIL/uL — ABNORMAL LOW (ref 4.22–5.81)
RDW: 15.5 % (ref 11.5–15.5)
WBC: 21.1 10*3/uL — ABNORMAL HIGH (ref 4.0–10.5)
nRBC: 0 % (ref 0.0–0.2)

## 2021-12-17 LAB — BASIC METABOLIC PANEL
Anion gap: 5 (ref 5–15)
BUN: 26 mg/dL — ABNORMAL HIGH (ref 6–20)
CO2: 22 mmol/L (ref 22–32)
Calcium: 7.9 mg/dL — ABNORMAL LOW (ref 8.9–10.3)
Chloride: 105 mmol/L (ref 98–111)
Creatinine, Ser: 1.25 mg/dL — ABNORMAL HIGH (ref 0.61–1.24)
GFR, Estimated: >60 mL/min (ref >60)
Glucose, Bld: 204 mg/dL — ABNORMAL HIGH (ref 70–99)
Potassium: 4.8 mmol/L (ref 3.5–5.1)
Sodium: 132 mmol/L — ABNORMAL LOW (ref 135–145)

## 2021-12-17 LAB — HIV ANTIBODY (ROUTINE TESTING W REFLEX): HIV Screen 4th Generation wRfx: NONREACTIVE

## 2021-12-17 LAB — URINE DRUG SCREEN, QUALITATIVE (ARMC ONLY)
Amphetamines, Ur Screen: NOT DETECTED
Barbiturates, Ur Screen: NOT DETECTED
Benzodiazepine, Ur Scrn: NOT DETECTED
Cannabinoid 50 Ng, Ur ~~LOC~~: NOT DETECTED
Cocaine Metabolite,Ur ~~LOC~~: POSITIVE — AB
MDMA (Ecstasy)Ur Screen: NOT DETECTED
Methadone Scn, Ur: NOT DETECTED
Opiate, Ur Screen: NOT DETECTED
Phencyclidine (PCP) Ur S: NOT DETECTED
Tricyclic, Ur Screen: NOT DETECTED

## 2021-12-17 LAB — PROCALCITONIN: Procalcitonin: 0.45 ng/mL

## 2021-12-17 LAB — ETHANOL: Alcohol, Ethyl (B): <10 mg/dL (ref <10)

## 2021-12-17 MED ORDER — SODIUM CHLORIDE 0.9 % IV SOLN
3.0000 g | Freq: Four times a day (QID) | INTRAVENOUS | Status: DC
  Administered 2021-12-17 – 2021-12-20 (×13): 3 g via INTRAVENOUS
  Filled 2021-12-17 (×2): qty 8
  Filled 2021-12-17: qty 3
  Filled 2021-12-17: qty 8
  Filled 2021-12-17: qty 3
  Filled 2021-12-17 (×3): qty 8
  Filled 2021-12-17 (×4): qty 3
  Filled 2021-12-17 (×3): qty 8

## 2021-12-17 MED ORDER — TRAMADOL HCL 50 MG PO TABS
100.0000 mg | ORAL_TABLET | Freq: Four times a day (QID) | ORAL | Status: DC | PRN
  Administered 2021-12-17 – 2021-12-18 (×2): 100 mg via ORAL
  Filled 2021-12-17 (×2): qty 2

## 2021-12-17 MED ORDER — IPRATROPIUM-ALBUTEROL 0.5-2.5 (3) MG/3ML IN SOLN
3.0000 mL | Freq: Four times a day (QID) | RESPIRATORY_TRACT | Status: DC | PRN
  Administered 2021-12-17 – 2021-12-19 (×2): 3 mL via RESPIRATORY_TRACT
  Filled 2021-12-17 (×2): qty 3

## 2021-12-17 NOTE — Assessment & Plan Note (Addendum)
Treated with IV Unasyn since admission. ?Transition to PO Augmentin to complete 7 day course. ?Supportive care with bronchodilators, mucolytic's, antipyretics as needed. ?Pulmonary hygiene with I-S and flutter. ?Add guaifenesin/codeine cough syrup given significant costochondritis with cough. ?

## 2021-12-17 NOTE — Assessment & Plan Note (Addendum)
Suspect due to hypoxia with respiratory distress, unable to rule out overdose.  Did reportedly respond to Narcan given by EMS.  No opiates on UDS however. ?-- Monitor orthostatic vitals ?-- Fall precautions ? ?Echo 12/18/21: ??1. Left ventricular ejection fraction, by estimation, is >75%. The left ventricle has hyperdynamic function. The left ventricle has no regional wall motion abnormalities. Left ventricular diastolic parameters were  ?normal.  ??2. Right ventricular systolic function is normal. The right ventricular size is normal.  ??3. The mitral valve is normal in structure. No evidence of mitral valve regurgitation.  ??4. The aortic valve is normal in structure. Aortic valve regurgitation is not visualized. ?

## 2021-12-17 NOTE — Progress Notes (Signed)
Admission profile completed ?

## 2021-12-17 NOTE — Progress Notes (Addendum)
?Progress Note ? ? ?Patient: David Leach DOB: Feb 02, 1976 DOA: 12/16/2021     0 ?DOS: the patient was seen and examined on 12/17/2021 ?  ?Brief hospital course: ?Mr. David Leach is a 46 year old male with history of polysubstance abuse, odynophagia, history of DVT, history of drug overdose, history of PE, who presented to the ED on 12/16/2021 for evaluation of of syncope. ? ?Per ED provider, patient received Narcan via EMS and woke up.  Patient has just been released from jail 4 days ago.  Initially hypoxic with O2 sat of 81%, required supplemental oxygen 15 L/min via nonrebreather mask.  Otherwise patient was afebrile, tachypneic and tachycardic with stable BP. ? ?CT head, CT cervical spine negative for acute findings. ?CTA chest negative for PE, showed distended fluid-filled stomach with fluid in thoracic esophagus consistent with reflux or emesis.  Bilateral dependent groundglass airspace disease highly concerning for aspiration. ? ?Patient was treated with IV steroids, IV antibiotics and DuoNebs in the ED.  Admitted to hospitalist service for further evaluation management. ? ?Assessment and Plan: ?* Altered mental status ?With syncope.  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  No neurologic deficits.  CT head negative. ?Mental status back to baseline ?--Treat infection as outlined ?- Aspiration and fall precautions ? ?Aspiration pneumonia (HCC) ?Change antibiotic to Unasyn. ?Supportive care with bronchodilators, mucolytic's, antipyretics as needed. ?Pulmonary hygiene with I-S and flutter. ? ?Syncope ?Suspect due to hypoxia with respiratory distress, unable to rule out overdose.  Did reportedly respond to Narcan given by EMS.  No opiates on UDS however. ?-- Monitor orthostatic vitals ?-- Fall precautions ?--  David pending ? ?Chronic right-sided thoracic back pain ?Right anterior rib pain, appears MSK etiology vs related to PNA.  Seems chronic. ?- Lidocaine patch ordered ?- Toradol 7.5 mg IV q6h PRN ?- Tramadol PRN ?- Judicious and conservative pain management on admission given that patient presented for acute hypoxic respiratory failure ? ? ?AKI (acute kidney injury) (HCC) ?Creatinine on admission 1.70, up from baseline 0.77-1.1.  Likely prerenal azotemia, renal function improving with fluids. ?-- Continue IV fluids ?--Monitor BMP ?--Avoid nephrotoxins and hypotension ?-- Renally dose meds ? ?Acute respiratory failure with hypoxia (HCC) ?Initially with O2 sat of 81%, required 15 L/min O2 via nonrebreather mask in the ED.  Respiratory status is improved, weaned to room air today (4/20). ?-- Treat pneumonia as outlined ?-- Supplemental oxygen as needed to maintain sats above 90% ? ?Amphetamine abuse (HCC) ?Per chart review, UDS positive for amphetamine on 10/10/2020. ?UDS obtained on admission positive for cocaine. ? ?Tobacco abuse ?- Nicotine patch as needed for nicotine craving ordered ? ? ? ? ?  ? ?Subjective: Patient awake resting in bed and holding in the ED when seen on rounds.  Reported feeling a little bit better today.  Shortness of breath is improved.  He reports prior to passing out at work, he got increasingly short of breath and dizzy after spraying paint with Primer and inhaling some fumes from that.  He reports feeling progressively ill while incarcerated prior to being released 4 days before admission, thinks this illness was brewing for quite some time.  He reports shortness of breath if he tries to lay flat to sleep.  Denies prior diagnosis of heart failure but does say that his legs swell at times.  Currently no  other acute complaints. ? ?Physical Exam: ?Vitals:  ? 12/17/21 1119 12/17/21 1302 12/17/21 1351 12/17/21 1638  ?BP: (!) 156/78 100/75 (!) 142/99 112/76  ?Pulse: 91 60 96 84  ?Resp: 18 19 18     ?Temp:   98.1 ?F (36.7 ?C) 98 ?F (36.7 ?C)  ?TempSrc:    Oral  ?SpO2: 95% 93% 94% 98%  ?Weight:      ?Height:      ? ?General exam: awake, alert, no acute distress ?HEENT: moist mucus membranes, hearing grossly normal  ?Respiratory system: Coarse bibasilar crackles, normal respiratory effort at rest, on room air. ?Cardiovascular system: normal S1/S2, RRR, trace to 1+ bilateral lower extremity edema.   ?Gastrointestinal system: soft, NT, ND, no HSM felt, +bowel sounds. ?Central nervous system: A&O x3. no gross focal neurologic deficits, normal speech ?Extremities: moves all, no cyanosis, normal tone ?Skin: dry, intact, normal temperature ?Psychiatry: normal mood, congruent affect, judgement and insight appear normal ? ? ?Data Reviewed: ? ?Notable labs: 132, glucose 204, BUN 26, creatinine improved 1.25, calcium 7.9, procalcitonin now elevated previously -0.45, WBC 21.1 from 7.9, hemoglobin 10.7 ? ?Family Communication: Younger family member at bedside on rounds ? ?Disposition: ?Status is: Inpatient ?Remains inpatient appropriate because: Severity of illness on IV therapies as outlined above with ongoing evaluation not appropriate for the outpatient setting ? ? Planned Discharge Destination: Home ? ? ? ?Time spent: 45 minutes ? ?Author: ? , DO ?12/17/2021 4:56 PM ? ?For on call review [email or web address].  ?

## 2021-12-17 NOTE — ED Notes (Signed)
Tolerated transition to room air. ?

## 2021-12-17 NOTE — Consult Note (Signed)
Pharmacy Antibiotic Note ? ?David Leach is a 46 y.o. male admitted on 12/16/2021 with  altered mental status and syncope .  Pharmacy has been consulted for Unasyn dosing. ? ?Plan: ?Unasyn 3g Q 6 hours ? ?Height: 5\' 11"  (180.3 cm) ?Weight: 104.3 kg (230 lb) ?IBW/kg (Calculated) : 75.3 ? ?Temp (24hrs), Avg:97.9 ?F (36.6 ?C), Min:97.9 ?F (36.6 ?C), Max:97.9 ?F (36.6 ?C) ? ?Recent Labs  ?Lab 12/16/21 ?1834 12/17/21 ?0441  ?WBC 7.9 21.1*  ?CREATININE 1.70* 1.25*  ?  ?Estimated Creatinine Clearance: 92.7 mL/min (A) (by C-G formula based on SCr of 1.25 mg/dL (H)).   ? ?Allergies  ?Allergen Reactions  ? Hydrocodone Rash and Itching  ? ? ?Antimicrobials this admission: ?Azithro/Rocephin 4/20 x1 ?Unasyn 4/20 >>  ? ?Dose adjustments this admission: ?N/a ? ? ? ?Thank you for allowing pharmacy to be a part of this patient?s care. ? ?5/20 ?12/17/2021 9:12 AM ? ?

## 2021-12-18 ENCOUNTER — Inpatient Hospital Stay
Admit: 2021-12-18 | Discharge: 2021-12-18 | Disposition: A | Discharge status: Home / Self Care | Payer: Self-pay | Attending: Internal Medicine | Admitting: Internal Medicine

## 2021-12-18 LAB — BASIC METABOLIC PANEL
Anion gap: 5 (ref 5–15)
BUN: 25 mg/dL — ABNORMAL HIGH (ref 6–20)
CO2: 24 mmol/L (ref 22–32)
Calcium: 7.9 mg/dL — ABNORMAL LOW (ref 8.9–10.3)
Chloride: 109 mmol/L (ref 98–111)
Creatinine, Ser: 0.87 mg/dL (ref 0.61–1.24)
GFR, Estimated: >60 mL/min (ref >60)
Glucose, Bld: 152 mg/dL — ABNORMAL HIGH (ref 70–99)
Potassium: 4.8 mmol/L (ref 3.5–5.1)
Sodium: 138 mmol/L (ref 135–145)

## 2021-12-18 LAB — CBC
HCT: 31.7 % — ABNORMAL LOW (ref 39.0–52.0)
Hemoglobin: 10 g/dL — ABNORMAL LOW (ref 13.0–17.0)
MCH: 27.4 pg (ref 26.0–34.0)
MCHC: 31.5 g/dL (ref 30.0–36.0)
MCV: 86.8 fL (ref 80.0–100.0)
Platelets: 248 10*3/uL (ref 150–400)
RBC: 3.65 MIL/uL — ABNORMAL LOW (ref 4.22–5.81)
RDW: 16 % — ABNORMAL HIGH (ref 11.5–15.5)
WBC: 14.3 10*3/uL — ABNORMAL HIGH (ref 4.0–10.5)
nRBC: 0 % (ref 0.0–0.2)

## 2021-12-18 LAB — ECHOCARDIOGRAM COMPLETE
AR max vel: 2.67 cm2
AV Area VTI: 2.47 cm2
AV Area mean vel: 2.55 cm2
AV Mean grad: 2 mmHg
AV Peak grad: 4.4 mmHg
Ao pk vel: 1.05 m/s
Area-P 1/2: 6.6 cm2
Height: 71 in
MV VTI: 1.67 cm2
S' Lateral: 2.4 cm
Weight: 3680 oz

## 2021-12-18 LAB — MAGNESIUM: Magnesium: 2.1 mg/dL (ref 1.7–2.4)

## 2021-12-18 LAB — PROCALCITONIN: Procalcitonin: 0.45 ng/mL

## 2021-12-18 MED ORDER — ALBUTEROL SULFATE (2.5 MG/3ML) 0.083% IN NEBU
3.0000 mL | INHALATION_SOLUTION | RESPIRATORY_TRACT | Status: DC | PRN
  Administered 2021-12-18 – 2021-12-20 (×5): 3 mL via RESPIRATORY_TRACT
  Filled 2021-12-18 (×4): qty 3

## 2021-12-18 MED ORDER — DM-GUAIFENESIN ER 30-600 MG PO TB12
1.0000 | ORAL_TABLET | Freq: Two times a day (BID) | ORAL | Status: DC
  Administered 2021-12-18 – 2021-12-21 (×8): 1 via ORAL
  Filled 2021-12-18 (×8): qty 1

## 2021-12-18 MED ORDER — OXYCODONE-ACETAMINOPHEN 5-325 MG PO TABS
1.0000 | ORAL_TABLET | Freq: Four times a day (QID) | ORAL | Status: DC | PRN
  Administered 2021-12-18 – 2021-12-20 (×7): 2 via ORAL
  Administered 2021-12-20: 1 via ORAL
  Administered 2021-12-20 – 2021-12-21 (×2): 2 via ORAL
  Filled 2021-12-18 (×4): qty 2
  Filled 2021-12-18: qty 1
  Filled 2021-12-18 (×5): qty 2
  Filled 2021-12-18: qty 1

## 2021-12-18 MED ORDER — ASCORBIC ACID 500 MG PO TABS
1000.0000 mg | ORAL_TABLET | Freq: Every day | ORAL | Status: DC
  Administered 2021-12-18 – 2021-12-21 (×4): 1000 mg via ORAL
  Filled 2021-12-18 (×4): qty 2

## 2021-12-18 MED ORDER — VITAMIN B-12 1000 MCG PO TABS
500.0000 ug | ORAL_TABLET | Freq: Every day | ORAL | Status: DC
  Administered 2021-12-18 – 2021-12-21 (×4): 500 ug via ORAL
  Filled 2021-12-18 (×4): qty 1

## 2021-12-18 NOTE — Progress Notes (Signed)
CSW notes patient cocaine positive upon admission. Met with patient at bedside to offer substance abuse resources, patient declines at this time. Reports he recently got out of jail and just did a line of cocaine the other night but does not have a problem.  ? ?Patient reports no further needs at this time.  ? Pricilla Riffle, [place] ?[phone] ? ?

## 2021-12-18 NOTE — Progress Notes (Signed)
*  PRELIMINARY RESULTS* ?Echocardiogram ?2D Echocardiogram has been performed. ? ?David Leach, Dorene Sorrow ?12/18/2021, 8:38 AM ?

## 2021-12-18 NOTE — Progress Notes (Signed)
?Progress Note ? ? ?Patient: David Leach WFU:[id number] DOB: [birth year] DOA: 12/16/2021     1 ?DOS: the patient was seen and examined on 12/18/2021 ?  ?Brief hospital course: ?David Leach is a 46 year old male with history of polysubstance abuse, odynophagia, history of DVT, history of drug overdose, history of PE, who presented to the ED on 12/16/2021 for evaluation of of syncope. ? ?Per ED provider, patient received Narcan via EMS and woke up.  Patient has just been released from jail 4 days ago.  Initially hypoxic with O2 sat of 81%, required supplemental oxygen 15 L/min via nonrebreather mask.  Otherwise patient was afebrile, tachypneic and tachycardic with stable BP. ? ?CT head, CT cervical spine negative for acute findings. ?CTA chest negative for PE, showed distended fluid-filled stomach with fluid in thoracic esophagus consistent with reflux or emesis.  Bilateral dependent groundglass airspace disease highly concerning for aspiration. ? ?Patient was treated with IV steroids, IV antibiotics and DuoNebs in the ED.  Admitted to hospitalist service for further evaluation management. ? ? ?4/21: Patient reports worsening symptoms including cough, shortness of breath and rib cage pain. ? ?Assessment and Plan: ?* Altered mental status ?With syncope.  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  No neurologic deficits.  CT head negative. ?Mental status back to baseline ?--Treat infection as outlined ?- Aspiration and fall precautions ? ?Aspiration pneumonia (HCC) ?Change antibiotic to Unasyn. ?Supportive care with bronchodilators, mucolytic's, antipyretics as needed. ?Pulmonary hygiene with I-S and flutter. ? ?Syncope ?Suspect due to hypoxia with respiratory distress, unable to rule out overdose.  Did reportedly respond to  Narcan given by EMS.  No opiates on UDS however. ?-- Monitor orthostatic vitals ?-- Fall precautions ? ?Echo 12/18/21: ? 1. Left ventricular ejection fraction, by estimation, is >75%. The left ventricle has hyperdynamic function. The left ventricle has no regional wall motion abnormalities. Left ventricular diastolic parameters were  ?normal.  ? 2. Right ventricular systolic function is normal. The right ventricular size is normal.  ? 3. The mitral valve is normal in structure. No evidence of mitral valve regurgitation.  ? 4. The aortic valve is normal in structure. Aortic valve regurgitation is not visualized. ? ?Chronic right-sided thoracic back pain ?Right anterior rib pain, appears MSK etiology vs related to PNA and couging.  Seems in part chronic. ?- Lidocaine patch ordered ?- Toradol 7.5 mg IV q6h PRN ?- Tramadol PRN ? ?AKI (acute kidney injury) (HCC) ?Creatinine on admission 1.70, up from baseline 0.77-1.1.  Likely prerenal azotemia, renal function improved with fluids. ?--Stop IV fluids ?--Monitor BMP ? ?Cr: 1.70 >>1.25 >> 0.87 ? ?Acute respiratory failure with hypoxia (HCC) ?Initially with O2 sat of 81%, required 15 L/min O2 via nonrebreather mask in the ED.  Respiratory status is improved, weaned to room air today (4/20). ?-- Treat pneumonia as outlined ?-- Supplemental oxygen as needed to maintain sats above 90% ? ?Amphetamine abuse (HCC) ?Per chart review, UDS positive for amphetamine on 10/10/2020. ?UDS obtained on admission positive for cocaine. ? ?Tobacco abuse ?- Nicotine patch as needed for nicotine craving ordered ? ? ? ? ?  ? ?Subjective: Patient awake resting in bed when seen on rounds today.  He reports feeling worse today, feels like no pneumonia is "settling in".  He continues to have nonproductive cough, feels like unable to make phlegm,  up.  Also reporting right worse than left rib cage pain more severe today. ? ?Physical Exam: ?Vitals:  ? 12/17/21 2044 12/18/21 0333 12/18/21 0758 12/18/21  1118  ?BP: 120/69 126/81 (!) 132/91 133/83  ?Pulse: 94 83 75 76  ?Resp: 18  16 20   ?Temp: 98.3 ?F (36.8 ?C) 98.3 ?F (36.8 ?C) 97.7 ?F (36.5 ?C) 97.8 ?F (36.6 ?C)  ?TempSrc:      ?SpO2: 95% 92% 95% 96%  ?Weight:      ?Height:      ? ?General exam: awake, alert, no acute distress ?HEENT: moist mucus membranes, hearing grossly normal  ?Respiratory system: CTAB, no wheezes, rales or rhonchi, normal respiratory effort. ?Cardiovascular system: normal S1/S2, RRR, trace lower extremity edema.   ?Central nervous system: A&O x3. no gross focal neurologic deficits, normal speech ?Extremities: moves all, no cyanosis, normal tone ?Musculoskeletal: Lateral rib cage tender on palpation bilaterally right more than left ?Skin: dry, intact, normal temperature ?Psychiatry: normal mood, congruent affect, judgement and insight appear normal ? ? ? ?Data Reviewed: ? ?Notable labs: Creatinine improved to 0.87, glucose 152, BUN 25, calcium 7.9, procalcitonin unchanged 0.45, WBC improved to 14.3, hemoglobin 10.0 stable ? ? ?Echo 12/18/21: ? 1. Left ventricular ejection fraction, by estimation, is >75%. The left ventricle has hyperdynamic function. The left ventricle has no regional wall motion abnormalities. Left ventricular diastolic parameters were  ?normal.  ? 2. Right ventricular systolic function is normal. The right ventricular size is normal.  ? 3. The mitral valve is normal in structure. No evidence of mitral valve regurgitation.  ? 4. The aortic valve is normal in structure. Aortic valve regurgitation is  ? ?Family Communication: None at bedside on rounds, will attempt to call as time allows ? ?Disposition: ?Status is: Inpatient ?Remains inpatient appropriate because: Severity of illness remaining on IV antibiotics until further clinical improvement ? ? Planned Discharge Destination: Home ? ? ? ?Time spent: 40 minutes ? ?Author: ?12/20/21, DO ?12/18/2021 3:49 PM ? ?For on call review [email or web address].  ?

## 2021-12-18 NOTE — Progress Notes (Signed)
Mobility Specialist - Progress Note ? ? ? 12/18/21 1500  ?Mobility  ?Activity Ambulated independently in hallway;Stood at bedside;Dangled on edge of bed  ?Level of Assistance Independent  ?Assistive Device None  ?Distance Ambulated (ft) 120 ft  ?Activity Response Tolerated well  ?$Mobility charge 1 Mobility  ? ? ?Pre-mobility: 107 HR, 96% SpO2 ?During mobility: 105 HR, 90-94% pO2 ?Post-mobility: 96% SPO2 ? ?Pt supine upon arrival using RA. Pt completes STS and bed mobility indep. Completes ambulation with SUPERVISION  --- voices complaints of mild chest tightedness and requests to return to room with needs in reach and family at bedside. ? ?Clarisa Schools ?Mobility Specialist ?12/18/21, 3:25 PM ? ?

## 2021-12-19 LAB — COMPREHENSIVE METABOLIC PANEL
ALT: 34 U/L (ref 0–44)
AST: 28 U/L (ref 15–41)
Albumin: 3 g/dL — ABNORMAL LOW (ref 3.5–5.0)
Alkaline Phosphatase: 71 U/L (ref 38–126)
Anion gap: 5 (ref 5–15)
BUN: 28 mg/dL — ABNORMAL HIGH (ref 6–20)
CO2: 25 mmol/L (ref 22–32)
Calcium: 8.2 mg/dL — ABNORMAL LOW (ref 8.9–10.3)
Chloride: 107 mmol/L (ref 98–111)
Creatinine, Ser: 0.88 mg/dL (ref 0.61–1.24)
GFR, Estimated: >60 mL/min (ref >60)
Glucose, Bld: 117 mg/dL — ABNORMAL HIGH (ref 70–99)
Potassium: 4.7 mmol/L (ref 3.5–5.1)
Sodium: 137 mmol/L (ref 135–145)
Total Bilirubin: 0.7 mg/dL (ref 0.3–1.2)
Total Protein: 6.2 g/dL — ABNORMAL LOW (ref 6.5–8.1)

## 2021-12-19 LAB — CBC
HCT: 32.8 % — ABNORMAL LOW (ref 39.0–52.0)
Hemoglobin: 10.1 g/dL — ABNORMAL LOW (ref 13.0–17.0)
MCH: 27.4 pg (ref 26.0–34.0)
MCHC: 30.8 g/dL (ref 30.0–36.0)
MCV: 88.9 fL (ref 80.0–100.0)
Platelets: 245 10*3/uL (ref 150–400)
RBC: 3.69 MIL/uL — ABNORMAL LOW (ref 4.22–5.81)
RDW: 16.3 % — ABNORMAL HIGH (ref 11.5–15.5)
WBC: 13.1 10*3/uL — ABNORMAL HIGH (ref 4.0–10.5)
nRBC: 0 % (ref 0.0–0.2)

## 2021-12-19 LAB — HEMOGLOBIN A1C
Hgb A1c MFr Bld: 6.2 % — ABNORMAL HIGH (ref 4.8–5.6)
Mean Plasma Glucose: 131.24 mg/dL

## 2021-12-19 MED ORDER — GUAIFENESIN-CODEINE 100-10 MG/5ML PO SOLN
10.0000 mL | Freq: Four times a day (QID) | ORAL | Status: DC | PRN
  Administered 2021-12-19 – 2021-12-20 (×4): 10 mL via ORAL
  Filled 2021-12-19 (×5): qty 10

## 2021-12-19 MED ORDER — IPRATROPIUM-ALBUTEROL 0.5-2.5 (3) MG/3ML IN SOLN
3.0000 mL | Freq: Four times a day (QID) | RESPIRATORY_TRACT | Status: DC
  Administered 2021-12-19 – 2021-12-20 (×2): 3 mL via RESPIRATORY_TRACT
  Filled 2021-12-19 (×2): qty 3

## 2021-12-19 MED ORDER — METHYLPREDNISOLONE SODIUM SUCC 40 MG IJ SOLR
40.0000 mg | INTRAMUSCULAR | Status: DC
  Administered 2021-12-19: 40 mg via INTRAVENOUS
  Filled 2021-12-19: qty 1

## 2021-12-19 NOTE — Progress Notes (Signed)
Mobility Specialist - Progress Note ? ? 12/19/21 1700  ?Mobility  ?Activity Ambulated independently in hallway;Stood at bedside;Dangled on edge of bed  ?Level of Assistance Independent  ?Assistive Device None  ?Distance Ambulated (ft) 160 ft  ?Activity Response Tolerated well  ?$Mobility charge 1 Mobility  ? ? ? ?Pre-mobility: 90 HR,  97% SpO2 ?During mobility: 101 HR, 90-95% SpO2 ?Post-mobility: 95% SPO2 ? ?Pt semi supine in bed upon arrival using RA. Completes STS and ambulation indep -- voices trouble breathing but O2 stats remain in mid 90s throughout. Pt tolerates well and returns to bed with needs in reach. RN notified. ? ?Clarisa Schools ?Mobility Specialist ?12/19/21, 5:05 PM ? ? ?

## 2021-12-19 NOTE — Assessment & Plan Note (Addendum)
Now exacerbated due to PNA. ?4/22: Added IV steroids due to significant wheezing ?4/23: remains with significant wheezing   ?--Increased steroids to BID - IMPROVED ?--Albuterol nebs TID and PRN ?--Continue as needed bronchodilators. ?--Otherwise management as outlined ?

## 2021-12-19 NOTE — Progress Notes (Signed)
?Progress Note ? ? ?Patient: David Leach [id number] DOB: 05-20-1976 DOA: 12/16/2021     2 ?DOS: the patient was seen and examined on 12/19/2021 ?  ?Brief hospital course: ?David Leach is a 46 year old male with history of polysubstance abuse, odynophagia, history of DVT, history of drug overdose, history of PE, who presented to the ED on 12/16/2021 for evaluation of of syncope. ? ?Per ED provider, patient received Narcan via EMS and woke up.  Patient has just been released from jail 4 days ago.  Initially hypoxic with O2 sat of 81%, required supplemental oxygen 15 L/min via nonrebreather mask.  Otherwise patient was afebrile, tachypneic and tachycardic with stable BP. ? ?CT head, CT cervical spine negative for acute findings. ?CTA chest negative for PE, showed distended fluid-filled stomach with fluid in thoracic esophagus consistent with reflux or emesis.  Bilateral dependent groundglass airspace disease highly concerning for aspiration. ? ?Patient was treated with IV steroids, IV antibiotics and DuoNebs in the ED.  Admitted to hospitalist service for further evaluation management. ? ? ?4/21: Patient reports worsening symptoms including cough, shortness of breath and rib cage pain. ? ?Assessment and Plan: ?* Altered mental status ?With syncope.  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  No neurologic deficits.  CT head negative. ?Mental status back to baseline ?--Treat infection as outlined ?- Aspiration and fall precautions ? ?Asthma, chronic ?4/22: Adding IV steroids due to significant wheezing, asthma seems mildly exacerbated in the setting of pneumonia. ?Continue as needed bronchodilators. ?Otherwise management as outlined ? ?Aspiration pneumonia ([place]) ?Continue Unasyn. ?Supportive care with bronchodilators,  mucolytic's, antipyretics as needed. ?Pulmonary hygiene with I-S and flutter. ?Add guaifenesin/codeine cough syrup given significant costochondritis with cough. ? ?Syncope ?Suspect due to hypoxia with respiratory distress, unable to rule out overdose.  Did reportedly respond to Narcan given by EMS.  No opiates on UDS however. ?-- Monitor orthostatic vitals ?-- Fall precautions ? ?Echo 12/18/21: ? 1. Left ventricular ejection fraction, by estimation, is >75%. The left ventricle has hyperdynamic function. The left ventricle has no regional wall motion abnormalities. Left ventricular diastolic parameters were  ?normal.  ? 2. Right ventricular systolic function is normal. The right ventricular size is normal.  ? 3. The mitral valve is normal in structure. No evidence of mitral valve regurgitation.  ? 4. The aortic valve is normal in structure. Aortic valve regurgitation is not visualized. ? ?Chronic right-sided thoracic back pain ?Right anterior rib pain, appears MSK etiology vs related to PNA and couging.  Seems in part chronic.   ?- Lidocaine patch ordered ?- Percocet PRN ? ?AKI (acute kidney injury) ([place]) ?Creatinine on admission 1.70, up from baseline 0.77-1.1.  Likely prerenal azotemia, renal function improved with fluids. ?--Stop IV fluids ?--Monitor BMP ? ?Cr: 1.70 >>1.25 >> 0.87 ? ?Acute respiratory failure with hypoxia ([place]) ?Initially with O2 sat of 81%, required 15 L/min O2 via nonrebreather mask in the ED.  Respiratory status is improved, weaned to room air today (4/20). ?-- Treat pneumonia as outlined ?-- Supplemental oxygen as needed to maintain sats above 90% ? ?Amphetamine abuse ([place]) ?Per chart review, UDS positive for amphetamine on 10/10/2020. ?UDS obtained on admission positive for cocaine. ? ?Tobacco abuse ?- Nicotine patch as needed for nicotine craving ordered ? ? ? ? ?  ? ?Subjective: Patient awake resting in bed when  seen today.  He reports still feeling very poorly, frequent nonproductive cough,  unable to get phlegm to come up.  Denies fevers or chills.  Continues to have significant bouts of right-sided chest/rib cage pain worsened with coughing.  Says he has tolerated codeine cough syrups in the past and agreeable.  Request something to help him sleep too. ? ?Physical Exam: ?Vitals:  ? 12/19/21 0051 12/19/21 0502 12/19/21 0804 12/19/21 1127  ?BP: 131/75 124/76 127/79 125/75  ?Pulse: 77 63 65 72  ?Resp: 18 18 16 16   ?Temp: 98.1 ?F (36.7 ?C) 97.8 ?F (36.6 ?C) 98 ?F (36.7 ?C) 98 ?F (36.7 ?C)  ?TempSrc:    Oral  ?SpO2: 99% 97% 98% 100%  ?Weight:  106.1 kg    ?Height:      ? ?General exam: awake, alert, no acute distress ?HEENT: moist mucus membranes, hearing grossly normal  ?Respiratory system: CTAB with diminished bases, diffuse expiratory wheezes, normal respiratory effort at rest, on room air. ?Cardiovascular system: normal S1/S2, RRR, no pedal edema.   ?Central nervous system: A&O x3. no gross focal neurologic deficits, normal speech ?Extremities: moves all, no cyanosis, normal tone ?Skin: dry, intact, normal temperature, normal color ?Psychiatry: normal mood, congruent affect, judgement and insight appear normal ? ? ?Data Reviewed: ? ?Notable labs: Glucose 117, BUN 28, calcium 8.2 with albumin 3.2, total protein 6.2, WBC 13.1, hemoglobin stable 10.1.  Hemoglobin A1c 6.2 ? ?Family Communication: None at bedside on rounds, will attempt to call as time allows ? ?Disposition: ?Status is: Inpatient ?Remains inpatient appropriate because: Severity of illness remaining on IV antibiotics and added IV steroids today for asthma exacerbation in the setting of pneumonia.  Anticipate DC in 24 to 48 hours pending further clinical improvement. ? ? ? Planned Discharge Destination: Home ? ? ? ?Time spent: 35 minutes ? ?Author: ?Ezekiel Slocumb, DO ?12/19/2021 2:44 PM ? ?For on call review [email or web address]  ?

## 2021-12-19 NOTE — Plan of Care (Signed)

## 2021-12-20 DIAGNOSIS — J45909 Unspecified asthma, uncomplicated: Secondary | ICD-10-CM

## 2021-12-20 LAB — CBC
HCT: 33.7 % — ABNORMAL LOW (ref 39.0–52.0)
Hemoglobin: 10.6 g/dL — ABNORMAL LOW (ref 13.0–17.0)
MCH: 27.7 pg (ref 26.0–34.0)
MCHC: 31.5 g/dL (ref 30.0–36.0)
MCV: 88 fL (ref 80.0–100.0)
Platelets: 244 10*3/uL (ref 150–400)
RBC: 3.83 MIL/uL — ABNORMAL LOW (ref 4.22–5.81)
RDW: 15.9 % — ABNORMAL HIGH (ref 11.5–15.5)
WBC: 13 10*3/uL — ABNORMAL HIGH (ref 4.0–10.5)
nRBC: 0 % (ref 0.0–0.2)

## 2021-12-20 MED ORDER — AMOXICILLIN-POT CLAVULANATE 875-125 MG PO TABS
1.0000 | ORAL_TABLET | Freq: Two times a day (BID) | ORAL | Status: DC
  Administered 2021-12-20 – 2021-12-21 (×3): 1 via ORAL
  Filled 2021-12-20 (×3): qty 1

## 2021-12-20 MED ORDER — METHYLPREDNISOLONE SODIUM SUCC 40 MG IJ SOLR
40.0000 mg | Freq: Two times a day (BID) | INTRAMUSCULAR | Status: DC
  Administered 2021-12-20 (×2): 40 mg via INTRAVENOUS
  Filled 2021-12-20 (×2): qty 1

## 2021-12-20 MED ORDER — ALBUTEROL SULFATE (2.5 MG/3ML) 0.083% IN NEBU
2.5000 mg | INHALATION_SOLUTION | Freq: Three times a day (TID) | RESPIRATORY_TRACT | Status: DC
  Administered 2021-12-20 – 2021-12-21 (×3): 2.5 mg via RESPIRATORY_TRACT
  Filled 2021-12-20 (×4): qty 3

## 2021-12-20 NOTE — Progress Notes (Signed)
Mobility Specialist - Progress Note ? ? ? 12/20/21 1135  ?Mobility  ?Activity Ambulated independently in hallway;Stood at bedside;Dangled on edge of bed  ?Level of Assistance Independent  ?Assistive Device None  ?Distance Ambulated (ft) 320 ft  ?Activity Response Tolerated well  ?$Mobility charge 1 Mobility  ? ? ?Pre-mobility on RA: 96% SpO2 ?During mobility on RA: 92-98% SpO2 ?Post-mobility on RA: 97% SPO2 ? ? ?Pt supine upon arrival using RA. Completes bed mobility, STS and ambulates 2 laps indep voicing SOB. O2 stats remain between 92-98% and pt tolerates well. Returns to bed with needs in reach. ? ?Clarisa Schools ?Mobility Specialist ?12/20/21, 11:38 AM ? ?

## 2021-12-20 NOTE — Plan of Care (Signed)

## 2021-12-20 NOTE — Progress Notes (Signed)
Persistent cough which is more productive but still difficulty producing phlegm. ?Progress Note ? ? ?Patient: David Leach OAC:[id number] DOB: [birth year] DOA: 12/16/2021     3 ?DOS: the patient was seen and examined on 12/20/2021 ?  ?Brief hospital course: ?Mr. Dollie Mayse is a 46 year old male with history of polysubstance abuse, odynophagia, history of DVT, history of drug overdose, history of PE, who presented to the ED on 12/16/2021 for evaluation of of syncope. ? ?Per ED provider, patient received Narcan via EMS and woke up.  Patient has just been released from jail 4 days ago.  Initially hypoxic with O2 sat of 81%, required supplemental oxygen 15 L/min via nonrebreather mask.  Otherwise patient was afebrile, tachypneic and tachycardic with stable BP. ? ?CT head, CT cervical spine negative for acute findings. ?CTA chest negative for PE, showed distended fluid-filled stomach with fluid in thoracic esophagus consistent with reflux or emesis.  Bilateral dependent groundglass airspace disease highly concerning for aspiration. ? ?Patient was treated with IV steroids, IV antibiotics and DuoNebs in the ED.  Admitted to hospitalist service for further evaluation management. ? ? ?4/21: Patient reports worsening symptoms including cough, shortness of breath and rib cage pain. ? ?Assessment and Plan: ?* Altered mental status ?With syncope.  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  No neurologic deficits.  CT head negative. ?Mental status back to baseline ?--Treat infection as outlined ?- Aspiration and fall precautions ? ?Asthma, chronic ?Now exacerbated due to PNA. ?4/22: Added IV steroids due to significant wheezing ?4/23: remains with significant wheezing  --Increase steroids to BID ?--Albuterol nebs TID and PRN ?--Continue as  needed bronchodilators. ?--Otherwise management as outlined ? ?Aspiration pneumonia (HCC) ?Treated with IV Unasyn since admission. ?Transition to PO Augmentin to complete 7 day course. ?Supportive care with bronchodilators, mucolytic's, antipyretics as needed. ?Pulmonary hygiene with I-S and flutter. ?Add guaifenesin/codeine cough syrup given significant costochondritis with cough. ? ?Syncope ?Suspect due to hypoxia with respiratory distress, unable to rule out overdose.  Did reportedly respond to Narcan given by EMS.  No opiates on UDS however. ?-- Monitor orthostatic vitals ?-- Fall precautions ? ?Echo 12/18/21: ? 1. Left ventricular ejection fraction, by estimation, is >75%. The left ventricle has hyperdynamic function. The left ventricle has no regional wall motion abnormalities. Left ventricular diastolic parameters were  ?normal.  ? 2. Right ventricular systolic function is normal. The right ventricular size is normal.  ? 3. The mitral valve is normal in structure. No evidence of mitral valve regurgitation.  ? 4. The aortic valve is normal in structure. Aortic valve regurgitation is not visualized. ? ?Chronic right-sided thoracic back pain ?Right anterior rib pain, appears MSK etiology vs related to PNA and couging.  Seems in part chronic.   ?- Lidocaine patch ordered ?- Percocet PRN ? ?AKI (acute kidney injury) (HCC) ?Creatinine on admission 1.70, up from baseline 0.77-1.1.  Likely prerenal azotemia, renal function improved with fluids. ?--Stop IV fluids ?--Monitor BMP ? ?Cr: 1.70 >>1.25 >> 0.87 ? ?Acute respiratory failure with hypoxia (HCC) ?Initially with O2 sat of 81%, required 15 L/min O2 via nonrebreather mask in the ED.  Respiratory status is improved, weaned to room air today (4/20). ?-- Treat pneumonia as outlined ?-- Supplemental oxygen as needed to maintain sats above 90% ? ?Amphetamine abuse (HCC) ?Per chart review, UDS positive for amphetamine  on 10/10/2020. ?UDS obtained on admission positive for  cocaine. ? ?Tobacco abuse ?- Nicotine patch as needed for nicotine craving ordered ? ? ? ? ?  ? ?Subjective: Patient awake resting in bed when seen today.  He reports still being pretty short of breath, reports nebulizer treatments help break up phlegm and it comes up more easily afterwards.  Denies fevers chills.  Continues to have significant right-sided rib cage pain worse with coughing.  Otherwise no acute complaints.  He says that given his history of asthma, when he gets a respiratory infection it takes him longer to recover and has significant dyspnea. ? ?Physical Exam: ?Vitals:  ? 12/19/21 1956 12/19/21 2328 12/20/21 0414 12/20/21 0741  ?BP:  134/64 132/84 139/86  ?Pulse:  85 65 74  ?Resp:  17 16 16   ?Temp:  98.2 ?F (36.8 ?C) 98.1 ?F (36.7 ?C) 98.4 ?F (36.9 ?C)  ?TempSrc:  Oral    ?SpO2: 94% 95% 95% 97%  ?Weight:      ?Height:      ? ?General exam: awake, alert, no acute distress, mildly ill-appearing ?HEENT: moist mucus membranes, hearing grossly normal  ?Respiratory system: Remains with diffuse expiratory wheezes, slightly improved air movement today, no rales or rhonchi, normal respiratory effort at rest, on room air. ?Cardiovascular system: normal S1/S2, RRR, no pedal edema.   ?Central nervous system: A&O x3. no gross focal neurologic deficits, normal speech ?Extremities: moves all, no cyanosis, normal tone ?Skin: dry, intact, normal temperature ?Psychiatry: normal mood, congruent affect, judgement and insight appear normal ? ? ?Data Reviewed: ? ?Notable labs: Glucose 117, BUN 28, calcium 8.2 with albumin 3.0.  WBC stable 13.0, hemoglobin stable 10.6 ? ?Family Communication: None at bedside.  Patient is able to update ? ?Disposition: ?Status is: Inpatient ?Remains inpatient appropriate because: Remains on IV steroids for asthma exacerbation in the setting of aspiration pneumonia.  Anticipate discharge in 24 to 48 hours pending further clinical improvement. ? ? Planned Discharge Destination:  Home ? ? ? ?Time spent: 40 minutes ? ?Author: ? , DO ?12/20/2021 1:34 PM ? ?For on call review [email or web address].  ?

## 2021-12-21 ENCOUNTER — Other Ambulatory Visit: Payer: Self-pay

## 2021-12-21 MED ORDER — BENZONATATE 100 MG PO CAPS
100.0000 mg | ORAL_CAPSULE | Freq: Four times a day (QID) | ORAL | 0 refills | Status: AC | PRN
  Filled 2021-12-21: qty 30, 8d supply, fill #0

## 2021-12-21 MED ORDER — ALBUTEROL SULFATE HFA 108 (90 BASE) MCG/ACT IN AERS
2.0000 | INHALATION_SPRAY | RESPIRATORY_TRACT | 0 refills | Status: AC | PRN
  Filled 2021-12-21: qty 6.7, 17d supply, fill #0

## 2021-12-21 MED ORDER — DM-GUAIFENESIN ER 30-600 MG PO TB12: 1.0000 | ORAL_TABLET | Freq: Two times a day (BID) | ORAL | 0 refills | Status: DC

## 2021-12-21 MED ORDER — DM-GUAIFENESIN ER 30-600 MG PO TB12
1.0000 | ORAL_TABLET | Freq: Two times a day (BID) | ORAL | 0 refills | Status: AC
  Filled 2021-12-21: qty 10, 5d supply, fill #0

## 2021-12-21 MED ORDER — ALBUTEROL SULFATE (2.5 MG/3ML) 0.083% IN NEBU
3.0000 mL | INHALATION_SOLUTION | RESPIRATORY_TRACT | 1 refills | Status: DC | PRN
Start: 2021-12-21 — End: 2021-12-21

## 2021-12-21 MED ORDER — OXYCODONE-ACETAMINOPHEN 5-325 MG PO TABS: 1.0000 | ORAL_TABLET | Freq: Four times a day (QID) | ORAL | 0 refills | Status: AC | PRN

## 2021-12-21 MED ORDER — PREDNISONE 20 MG PO TABS
40.0000 mg | ORAL_TABLET | Freq: Every day | ORAL | Status: DC
  Administered 2021-12-21: 40 mg via ORAL
  Filled 2021-12-21: qty 2

## 2021-12-21 MED ORDER — PREDNISONE 10 MG PO TABS
ORAL_TABLET | ORAL | 0 refills | Status: AC
  Filled 2021-12-21: qty 14, 6d supply, fill #0

## 2021-12-21 MED ORDER — PREDNISONE 10 MG PO TABS: ORAL_TABLET | ORAL | 0 refills | Status: DC

## 2021-12-21 MED ORDER — AMOXICILLIN-POT CLAVULANATE 875-125 MG PO TABS
1.0000 | ORAL_TABLET | Freq: Two times a day (BID) | ORAL | 0 refills | Status: AC
  Filled 2021-12-21: qty 5, 3d supply, fill #0

## 2021-12-21 MED ORDER — BENZONATATE 100 MG PO CAPS: 100.0000 mg | ORAL_CAPSULE | Freq: Four times a day (QID) | ORAL | 0 refills | Status: DC | PRN

## 2021-12-21 MED ORDER — AMOXICILLIN-POT CLAVULANATE 875-125 MG PO TABS: 1.0000 | ORAL_TABLET | Freq: Two times a day (BID) | ORAL | 0 refills | Status: DC

## 2021-12-21 MED ORDER — ALBUTEROL SULFATE HFA 108 (90 BASE) MCG/ACT IN AERS
2.0000 | INHALATION_SPRAY | RESPIRATORY_TRACT | 0 refills | Status: DC | PRN
Start: 2021-12-21 — End: 2021-12-21

## 2021-12-21 NOTE — Discharge Summary (Signed)
?Physician Discharge Summary ?  ?Patient: David Leach MRN: [id number] DOB: [birth year]  ?Admit date:     12/16/2021  ?Discharge date: 01/03/22  ?Discharge Physician: Pennie Banter  ? ?PCP: [facility]  ? ?Recommendations at discharge:  ? ?Follow-up with primary care in 1 week ?Repeat CBC, BMP, Mg in 1 week ?Follow-up on patient's recovery from asthma exacerbation and pneumonia ? ?Discharge Diagnoses: ?Active Problems: ?  Asthma, chronic ?  Tobacco abuse ?  Amphetamine abuse (HCC) ?  Acute respiratory failure with hypoxia (HCC) ?  Chronic right-sided thoracic back pain ?  Syncope ?  Aspiration pneumonia (HCC) ? ?Principal Problem (Resolved): ?  Altered mental status ?Resolved Problems: ?  AKI (acute kidney injury) (HCC) ? ?Hospital Course: ?Mr. Lucciano Vitali is a 46 year old male with history of polysubstance abuse, odynophagia, history of DVT, history of drug overdose, history of PE, who presented to the ED on 12/16/2021 for evaluation of of syncope. ? ?Per ED provider, patient received Narcan via EMS and woke up.  Patient has just been released from jail 4 days ago.  Initially hypoxic with O2 sat of 81%, required supplemental oxygen 15 L/min via nonrebreather mask.  Otherwise patient was afebrile, tachypneic and tachycardic with stable BP. ? ?CT head, CT cervical spine negative for acute findings. ?CTA chest negative for PE, showed distended fluid-filled stomach with fluid in thoracic esophagus consistent with reflux or emesis.  Bilateral dependent groundglass airspace disease highly concerning for aspiration. ? ?Patient was treated with IV steroids, IV antibiotics and DuoNebs in the ED.  Admitted to hospitalist service for further evaluation management. ? ? ?4/21: Patient reports worsening symptoms including cough, shortness of breath and rib cage pain. ? ? ?Patient clinically improved and medically stable for discharge home with primary care follow-up in about 1 week.  Respiratory status is stable on  room air, no oxygen desaturations with ambulation. ? ? ?Assessment and Plan: ?* Altered mental status-resolved as of 01/03/2022 ?With syncope.  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  No neurologic deficits.  CT head negative. ?Mental status back to baseline ?--Treat infection as outlined ?- Aspiration and fall precautions ? ?Asthma, chronic ?Now exacerbated due to PNA. ?4/22: Added IV steroids due to significant wheezing ?4/23: remains with significant wheezing   ?--Increased steroids to BID - IMPROVED ?--Albuterol nebs TID and PRN ?--Continue as needed bronchodilators. ?--Otherwise management as outlined ? ?Aspiration pneumonia (HCC) ?Treated with IV Unasyn since admission. ?Transition to PO Augmentin to complete 7 day course. ?Supportive care with bronchodilators, mucolytic's, antipyretics as needed. ?Pulmonary hygiene with I-S and flutter. ?Add guaifenesin/codeine cough syrup given significant costochondritis with cough. ? ?Syncope ?Suspect due to hypoxia with respiratory distress, unable to rule out overdose.  Did reportedly respond to Narcan given by EMS.  No opiates on UDS however. ?-- Monitor orthostatic vitals ?-- Fall precautions ? ?Echo 12/18/21: ? 1. Left ventricular ejection fraction, by estimation, is >75%. The left ventricle has hyperdynamic function. The left ventricle has no regional wall motion abnormalities. Left ventricular diastolic parameters were  ?normal.  ? 2. Right ventricular systolic function is normal. The right ventricular size is normal.  ? 3. The mitral valve is normal in structure. No evidence of mitral valve regurgitation.  ? 4. The aortic valve is normal in structure. Aortic valve regurgitation is not visualized. ? ?Chronic  right-sided thoracic back pain ?Right anterior rib pain, appears MSK  etiology vs related to PNA and couging.  Seems in part chronic.   ?- Lidocaine patch ordered ?- Percocet PRN ? ?Acute respiratory failure with hypoxia (HCC) ?Initially with O2 sat of 81%, required 15 L/min O2 via nonrebreather mask in the ED.  Respiratory status is improved, weaned to room air today (4/20). ?-- Treat pneumonia as outlined ?-- Supplemental oxygen as needed to maintain sats above 90% ? ?Amphetamine abuse (HCC) ?Per chart review, UDS positive for amphetamine on 10/10/2020. ?UDS obtained on admission positive for cocaine. ? ?Tobacco abuse ?- Nicotine patch as needed for nicotine craving ordered ? ?AKI (acute kidney injury) (HCC)-resolved as of 01/03/2022 ?Creatinine on admission 1.70, up from baseline 0.77-1.1.  Likely prerenal azotemia, renal function improved with fluids. ?--Stop IV fluids ?--Monitor BMP ? ?Cr: 1.70 >>1.25 >> 0.87 ? ? ? ? ?  ? ? ?Consultants: None ?Procedures performed: None ?Disposition: Home ?Diet recommendation:  ?Regular diet ?DISCHARGE MEDICATION: ?Allergies as of 12/21/2021   ? ?   Reactions  ? Hydrocodone Rash, Itching  ? ?  ? ?  ?Medication List  ?  ? ?STOP taking these medications   ? ?rivaroxaban 10 MG Tabs tablet ?Commonly known as: XARELTO ?  ? ?  ? ?TAKE these medications   ? ?acetaminophen 325 MG tablet ?Commonly known as: TYLENOL ?Take 650 mg by mouth every 6 (six) hours as needed. ?  ?albuterol 108 (90 Base) MCG/ACT inhaler ?Commonly known as: ProAir HFA ?Inhale 2 puffs into the lungs every 4 (four) hours as needed for wheezing or shortness of breath. ?  ?benzonatate 100 MG capsule ?Commonly known as: LawyerTessalon Perles ?Take 1 capsule (100 mg total) by mouth every 6 (six) hours as needed for cough. ?  ?ibuprofen 200 MG tablet ?Commonly known as: ADVIL ?Take 400 mg by mouth every 6 (six) hours as needed. ?  ?vitamin B-12 500 MCG tablet ?Commonly known as: CYANOCOBALAMIN ?Take 500 mcg by mouth daily. ?  ?vitamin C 1000 MG tablet ?Take 1,000 mg by mouth daily. ?  ? ?  ? ?ASK  your doctor about these medications   ? ?amoxicillin-clavulanate 875-125 MG tablet ?Commonly known as: AUGMENTIN ?Take 1 tablet by mouth every 12 (twelve) hours for 5 doses. Starting this evening (4/24) ?Ask about: Should I take this medication? ?  ?dextromethorphan-guaiFENesin 30-600 MG 12hr tablet ?Commonly known as: MUCINEX DM ?Take 1 tablet by mouth 2 (two) times daily for 5 days. ?Ask about: Should I take this medication? ?  ?oxyCODONE-acetaminophen 5-325 MG tablet ?Commonly known as: PERCOCET/ROXICET ?Take 1-2 tablets by mouth every 6 (six) hours as needed for up to 3 days for severe pain. ?Ask about: Should I take this medication? ?  ?predniSONE 10 MG tablet ?Commonly known as: DELTASONE ?Take 4 tablets (40 mg total) by mouth daily with breakfast for 2 days, THEN 2 tablets (20 mg total) daily with breakfast for 2 days, THEN 1 tablet (10 mg total) daily with breakfast for 2 days. ?Start taking on: December 21, 2021 ?Ask about: Should I take this medication? ?  ? ?  ? ? Follow-up Information   ? ? [facility]   ?Contact information: ?[address] ?[place] [address] ?[phone] ? ? ?  ?  ? ?  ?  ? ?  ? ?Discharge Exam: ?Filed Weights  ? 12/16/21 2245 12/19/21 0502  ?Weight: 104.3 kg 106.1 kg  ? ?General exam: awake, alert, no acute  distress ?HEENT: atraumatic, clear conjunctiva, anicteric sclera, moist mucus membranes, hearing grossly normal  ?Respiratory system: CTAB, no wheezes, rales or rhonchi, normal respiratory effort. ?Cardiovascular system: normal S1/S2, RRR, no JVD, murmurs, rubs, gallops, no pedal edema.   ?Gastrointestinal system: soft, NT, ND, no HSM felt, +bowel sounds. ?Central nervous system: A&O x4. no gross focal neurologic deficits, normal speech ?Extremities: moves all, no edema, normal tone ?Psychiatry: normal mood, congruent affect, judgement and insight appear normal ? ? ?Condition at discharge: stable ? ?The results of significant diagnostics from this hospitalization  (including imaging, microbiology, ancillary and laboratory) are listed below for reference.  ? ?Imaging Studies: ?DG Chest 2 View ? ?Result Date: 12/16/2021 ?CLINICAL DATA:  Possible pneumothorax on previous e

## 2021-12-21 NOTE — Plan of Care (Signed)
?  Problem: Education: ?Goal: Knowledge of General Education information will improve ?Description: Including pain rating scale, medication(s)/side effects and non-pharmacologic comfort measures ?Outcome: Progressing ?  ?Problem: Clinical Measurements: ?Goal: Ability to maintain clinical measurements within normal limits will improve ?Outcome: Progressing ?Goal: Respiratory complications will improve ?Outcome: Progressing ?Goal: Cardiovascular complication will be avoided ?Outcome: Progressing ?  ?Problem: Activity: ?Goal: Risk for activity intolerance will decrease ?Outcome: Progressing ?  ?Problem: Nutrition: ?Goal: Adequate nutrition will be maintained ?Outcome: Progressing ?  ?Problem: Coping: ?Goal: Level of anxiety will decrease ?Outcome: Progressing ?  ?Problem: Elimination: ?Goal: Will not experience complications related to bowel motility ?Outcome: Progressing ?Goal: Will not experience complications related to urinary retention ?Outcome: Progressing ?  ?

## 2021-12-22 ENCOUNTER — Other Ambulatory Visit: Payer: Self-pay

## 2022-01-03 ENCOUNTER — Encounter: Payer: Self-pay | Admitting: Internal Medicine

## 2022-01-07 ENCOUNTER — Other Ambulatory Visit: Payer: Self-pay

## 2022-01-07 ENCOUNTER — Telehealth: Payer: Self-pay | Admitting: Pharmacy Technician

## 2022-01-07 NOTE — Telephone Encounter (Signed)
Patient only signed DOH Attestation.  Would need to provide current year's poi if PAP medications were needed. ? ?Opaline Reyburn J. Yong Wahlquist ?Patient Advocate Specialist ?[facility]  ?

## 2022-11-17 ENCOUNTER — Encounter: Payer: Self-pay | Admitting: Emergency Medicine

## 2022-11-17 DIAGNOSIS — Z23 Encounter for immunization: Secondary | ICD-10-CM | POA: Insufficient documentation

## 2022-11-17 DIAGNOSIS — W268XXA Contact with other sharp object(s), not elsewhere classified, initial encounter: Secondary | ICD-10-CM | POA: Insufficient documentation

## 2022-11-17 DIAGNOSIS — S81812A Laceration without foreign body, left lower leg, initial encounter: Secondary | ICD-10-CM | POA: Insufficient documentation

## 2022-11-17 NOTE — ED Triage Notes (Signed)
Pt presents ambulatory to triage via POV with complaints of leg laceration ~2-3 inches from a rusty nail from his chair. Pt states a nail from his recliner stuck out and scratched the L outer calf causing pain; bleeding controlled with guaze and coban. Not UTD on tetanus. A&Ox4 at this time. Denies CP or SOB.

## 2022-11-18 ENCOUNTER — Emergency Department
Admission: EM | Admit: 2022-11-18 | Discharge: 2022-11-18 | Disposition: A | Discharge status: Home / Self Care | Payer: Self-pay | Attending: Emergency Medicine | Admitting: Emergency Medicine

## 2022-11-18 DIAGNOSIS — S81812A Laceration without foreign body, left lower leg, initial encounter: Secondary | ICD-10-CM

## 2022-11-18 MED ORDER — TETANUS-DIPHTH-ACELL PERTUSSIS 5-2.5-18.5 LF-MCG/0.5 IM SUSY
0.5000 mL | PREFILLED_SYRINGE | Freq: Once | INTRAMUSCULAR | Status: AC
Start: 2022-11-18 — End: 2022-11-18
  Administered 2022-11-18: 0.5 mL via INTRAMUSCULAR
  Filled 2022-11-18: qty 0.5

## 2022-11-18 MED ORDER — LIDOCAINE HCL (PF) 1 % IJ SOLN
5.0000 mL | Freq: Once | INTRAMUSCULAR | Status: DC
  Filled 2022-11-18: qty 5

## 2022-11-18 NOTE — Discharge Instructions (Signed)
Please take Tylenol and ibuprofen/Advil for your pain.  It is safe to take them together, or to alternate them every few hours.  Take up to 1000mg  of Tylenol at a time, up to 4 times per day.  Do not take more than 4000 mg of Tylenol in 24 hours.  For ibuprofen, take 400-600 mg, 3 - 4 times per day.  Gently wash the wound with soap and water.  It is okay to shower, but do not submerge in a bath or go swimming as it is healing.  Do not vigorously scrub.   Gently pat dry.   Once dry, then apply Neosporin or bacitracin or even Vaseline ointment to the area to act as a barrier to help prevent infection.  Take the stitches out after about 7-10 days.  It is 1 long stitch with 7 insertion sites

## 2022-11-18 NOTE — ED Provider Notes (Signed)
[facility] Provider Note    Event Date/Time   First MD Initiated Contact with Patient 11/18/22 0144     (approximate)   History   Extremity Laceration   HPI  David Leach is a 47 y.o. male who presents to the ED for evaluation of Extremity Laceration   Patient presents to the ED for evaluation of an accidental left leg laceration.  Reports sitting down in a chair when an old nail from the recliner was sticking out and cut his leg.  This just happened this evening   Physical Exam   Triage Vital Signs: ED Triage Vitals  Enc Vitals Group     BP 11/17/22 2235 (!) 141/82     Pulse Rate 11/17/22 2235 85     Resp 11/17/22 2235 18     Temp 11/17/22 2235 99 F (37.2 C)     Temp Source 11/17/22 2235 Oral     SpO2 11/17/22 2235 98 %     Weight 11/17/22 2233 195 lb (88.5 kg)     Height 11/17/22 2233 5\' 11"  (1.803 m)     Head Circumference --      Peak Flow --      Pain Score 11/17/22 2235 8     Pain Loc --      Pain Edu? --      Excl. in [place]? --     Most recent vital signs: Vitals:   11/17/22 2235  BP: (!) 141/82  Pulse: 85  Resp: 18  Temp: 99 F (37.2 C)  SpO2: 98%    General: Awake, no distress.  CV:  Good peripheral perfusion.  Resp:  Normal effort.  Abd:  No distention.  MSK:  Longitudinal laceration just into the subcutaneous tissue of the left anterior/lateral shin/calf on the left side.  About 6 cm in length Neuro:  No focal deficits appreciated. Other:     ED Results / Procedures / Treatments   Labs (all labs ordered are listed, but only abnormal results are displayed) Labs Reviewed - No data to display  EKG   RADIOLOGY   Official radiology report(s): No results found.  PROCEDURES and INTERVENTIONS:  .Marland KitchenLaceration Repair  Date/Time: 11/18/2022 2:44 AM  Performed by: Vladimir Crofts, MD Authorized by: Vladimir Crofts, MD   Consent:    Consent obtained:  Verbal   Consent given by:  Patient   Risks, benefits, and  alternatives were discussed: yes   Laceration details:    Location:  Leg   Leg location:  L lower leg   Length (cm):  6 Exploration:    Hemostasis achieved with:  Direct pressure   Contaminated: no   Treatment:    Area cleansed with:  Povidone-iodine   Amount of cleaning:  Standard   Irrigation solution:  Sterile saline Skin repair:    Repair method:  Sutures   Suture size:  3-0   Wound skin closure material used: Ethilon.   Suture technique:  Running locked   Number of sutures:  7 Approximation:    Approximation:  Close Repair type:    Repair type:  Simple Post-procedure details:    Procedure completion:  Tolerated well, no immediate complications   Medications  lidocaine (PF) (XYLOCAINE) 1 % injection 5 mL (has no administration in time range)  Tdap (BOOSTRIX) injection 0.5 mL (0.5 mLs Intramuscular Given 11/18/22 0209)     IMPRESSION / MDM / ASSESSMENT AND PLAN / ED COURSE  I reviewed the triage vital  signs and the nursing notes.  Differential diagnosis includes, but is not limited to, laceration, foreign body, fracture  {Patient presents with symptoms of an acute illness or injury that is potentially life-threatening.  47 year old presents with accident till laceration to his leg requiring a few stitches and suitable for outpatient management.  No signs of trauma beyond his left leg laceration.  I repaired, as above, which is well-tolerated.  He was discharged with wound care instructions and we discussed suture removal.  Tdap was updated while he is here.  Clinical Course as of 11/18/22 0245  Thu Nov 18, 2022  0237 X7 3-0 ethilon.  Running locked [DS]    Clinical Course User Index [DS] Vladimir Crofts, MD     FINAL CLINICAL IMPRESSION(S) / ED DIAGNOSES   Final diagnoses:  Leg laceration, left, initial encounter     Rx / DC Orders   ED Discharge Orders     None        Note:  This document was prepared using Dragon voice recognition software and may  include unintentional dictation errors.   Vladimir Crofts, MD 11/18/22 [phone]

## 2022-11-24 ENCOUNTER — Ambulatory Visit: Payer: Self-pay

## 2022-12-23 ENCOUNTER — Ambulatory Visit: Payer: Self-pay | Admitting: Family Medicine

## 2022-12-23 DIAGNOSIS — B182 Chronic viral hepatitis C: Secondary | ICD-10-CM | POA: Insufficient documentation

## 2022-12-23 DIAGNOSIS — Z113 Encounter for screening for infections with a predominantly sexual mode of transmission: Secondary | ICD-10-CM

## 2022-12-23 LAB — HEPATITIS B SURFACE ANTIGEN

## 2022-12-23 LAB — HM HIV SCREENING LAB: HM HIV Screening: NEGATIVE

## 2022-12-23 LAB — HM HEPATITIS C SCREENING LAB: HM Hepatitis Screen: POSITIVE

## 2022-12-23 NOTE — Progress Notes (Signed)
Pt here for STI screening.  Denies symptoms.  Pt states 1 partner in last 60 days.  Condoms declined.  Not seen by a Christean Leaf, RN

## 2022-12-26 LAB — CHLAMYDIA/GC NAA, CONFIRMATION
Chlamydia trachomatis, NAA: NEGATIVE
Neisseria gonorrhoeae, NAA: NEGATIVE

## 2023-01-21 ENCOUNTER — Telehealth: Payer: Self-pay

## 2023-01-21 NOTE — Telephone Encounter (Signed)
Pt called on 01/20/23 regarding blood test results that were drawn on 12/23/22. Positive for hep C-considered actively infected. Pt is being referred to Hepatitis C-treating physician .Advised pt to to contact [facility] or [facility]. Discussed Hep C bridge counselor would be in contact to help connect with care. Provided education on Hep C. 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

## 2023-02-28 ENCOUNTER — Encounter: Payer: Self-pay | Admitting: Emergency Medicine

## 2023-02-28 ENCOUNTER — Other Ambulatory Visit: Payer: Self-pay

## 2023-02-28 ENCOUNTER — Emergency Department: Payer: Self-pay

## 2023-02-28 ENCOUNTER — Emergency Department
Admission: EM | Admit: 2023-02-28 | Discharge: 2023-02-28 | Disposition: A | Discharge status: Home / Self Care | Payer: Self-pay | Attending: Emergency Medicine | Admitting: Emergency Medicine

## 2023-02-28 DIAGNOSIS — F1990 Other psychoactive substance use, unspecified, uncomplicated: Secondary | ICD-10-CM

## 2023-02-28 DIAGNOSIS — R519 Headache, unspecified: Secondary | ICD-10-CM | POA: Insufficient documentation

## 2023-02-28 DIAGNOSIS — F1910 Other psychoactive substance abuse, uncomplicated: Secondary | ICD-10-CM | POA: Insufficient documentation

## 2023-02-28 DIAGNOSIS — J45909 Unspecified asthma, uncomplicated: Secondary | ICD-10-CM | POA: Insufficient documentation

## 2023-02-28 MED ORDER — KETOROLAC TROMETHAMINE 30 MG/ML IJ SOLN
30.0000 mg | Freq: Once | INTRAMUSCULAR | Status: AC
  Administered 2023-02-28: 30 mg via INTRAMUSCULAR
  Filled 2023-02-28: qty 1

## 2023-02-28 MED ORDER — ONDANSETRON 4 MG PO TBDP
4.0000 mg | ORAL_TABLET | Freq: Once | ORAL | Status: AC
  Administered 2023-02-28: 4 mg via ORAL
  Filled 2023-02-28: qty 1

## 2023-02-28 NOTE — Discharge Instructions (Signed)
Your exam and head CT are normal at this time.  No signs of a serious head injury or bleed.  Take OTC Tylenol Motrin as needed for headache pain relief.  Follow-up with 1 of the local community clinics for ongoing primary care.

## 2023-02-28 NOTE — ED Provider Notes (Signed)
[facility] Emergency Department Provider Note     Event Date/Time   First MD Initiated Contact with Patient 02/28/23 1911     (approximate)   History   Headache   HPI  David Leach is a 47 y.o. male with a history of asthma, DVT, AKI, and illicit drug use, presents to the ED for evaluation of a headache.  Reports onset of a headache last night.  He did admit to doing cocaine last night prior to onset.  He wonders if that use is the underlying cause of his headache pain at this time.  He denies any vision change, weakness, paralysis, facial droop, or slurred speech.     Physical Exam   Triage Vital Signs: ED Triage Vitals [02/28/23 1848]  Enc Vitals Group     BP (!) 128/95     Pulse Rate 92     Resp 18     Temp 98 F (36.7 C)     Temp Source Oral     SpO2 99 %     Weight      Height      Head Circumference      Peak Flow      Pain Score 8     Pain Loc      Pain Edu?      Excl. in GC?     Most recent vital signs: Vitals:   02/28/23 1848 02/28/23 2054  BP: (!) 128/95 126/74  Pulse: 92 88  Resp: 18 17  Temp: 98 F (36.7 C) 98.4 F (36.9 C)  SpO2: 99% 98%    General Awake, no distress. NAD HEENT NCAT. PERRL. EOMI. No rhinorrhea. Mucous membranes are moist.  CV:  Good peripheral perfusion. RRR RESP:  Normal effort. CTA ABD:  No distention. Soft, nontender MSK:  Normal spinal alignment without midline tenderness, spasm, vomiting, or step-off.  Full active range of motion of all extremities. NEURO: Cranial nerves II to XII grossly intact.   ED Results / Procedures / Treatments   Labs (all labs ordered are listed, but only abnormal results are displayed) Labs Reviewed - No data to display   EKG   RADIOLOGY  I personally viewed and evaluated these images as part of my medical decision making, as well as reviewing the written report by the radiologist.  ED Provider Interpretation: No acute findings  CT Head Wo  Contrast  Result Date: 02/28/2023 CLINICAL DATA:  Headache, post traumatic, cocaine use. EXAM: CT HEAD WITHOUT CONTRAST TECHNIQUE: Contiguous axial images were obtained from the base of the skull through the vertex without intravenous contrast. RADIATION DOSE REDUCTION: This exam was performed according to the departmental dose-optimization program which includes automated exposure control, adjustment of the mA and/or kV according to patient size and/or use of iterative reconstruction technique. COMPARISON:  CT examination dated December 16, 2021 FINDINGS: Brain: No evidence of acute infarction, hemorrhage, hydrocephalus, extra-axial collection or mass lesion/mass effect. Vascular: No hyperdense vessel or unexpected calcification. Skull: Normal. Negative for fracture or focal lesion. Sinuses/Orbits: No acute finding. Other: None. IMPRESSION: No acute intracranial pathology. Electronically Signed   By: Larose Hires D.O.   On: 02/28/2023 20:09     PROCEDURES:  Critical Care performed: No  Procedures   MEDICATIONS ORDERED IN ED: Medications  ketorolac (TORADOL) 30 MG/ML injection 30 mg (30 mg Intramuscular Given 02/28/23 2044)  ondansetron (ZOFRAN-ODT) disintegrating tablet 4 mg (4 mg Oral Given 02/28/23 2044)     IMPRESSION /  MDM / ASSESSMENT AND PLAN / ED COURSE  I reviewed the triage vital signs and the nursing notes.                              Differential diagnosis includes, but is not limited to, intracranial hemorrhage, meningitis/encephalitis, previous head trauma, cavernous venous thrombosis, tension headache, temporal arteritis, migraine or migraine equivalent, idiopathic intracranial hypertension, and non-specific headache.   Patient's presentation is most consistent with acute complicated illness / injury requiring diagnostic workup.  Patient's diagnosis is consistent with general headache, without evidence of acute intracranial process.  Patient with reassuring exam and CT scan  interpreted by me.  Patient stable throughout his course in the ED.  He was treated with an IM dose of Toradol and oral dose of Zofran prior to discharge.  Patient will be discharged home with instructions to take OTC Tylenol as needed. Patient is to follow up with his primary provider or local community clinic as discussed as needed or otherwise directed. Patient is given ED precautions to return to the ED for any worsening or new symptoms.   FINAL CLINICAL IMPRESSION(S) / ED DIAGNOSES   Final diagnoses:  Acute nonintractable headache, unspecified headache type  Illicit drug use     Rx / DC Orders   ED Discharge Orders     None        Note:  This document was prepared using Dragon voice recognition software and may include unintentional dictation errors.    Lissa Hoard, PA-C 03/01/23 Lorrine Kin, MD 03/01/23 1047

## 2023-02-28 NOTE — ED Triage Notes (Signed)
Patient to ED via POV for headache that started last PM. States he did "2 lines of cocaine 2 days and isn't sure if that has something to do with it."

## 2023-05-22 ENCOUNTER — Emergency Department
Admission: EM | Admit: 2023-05-22 | Discharge: 2023-05-22 | Disposition: A | Discharge status: Home / Self Care | Payer: Self-pay | Attending: Emergency Medicine | Admitting: Emergency Medicine

## 2023-05-22 ENCOUNTER — Other Ambulatory Visit: Payer: Self-pay

## 2023-05-22 ENCOUNTER — Emergency Department: Payer: Self-pay

## 2023-05-22 DIAGNOSIS — F17290 Nicotine dependence, other tobacco product, uncomplicated: Secondary | ICD-10-CM | POA: Insufficient documentation

## 2023-05-22 DIAGNOSIS — J45909 Unspecified asthma, uncomplicated: Secondary | ICD-10-CM | POA: Insufficient documentation

## 2023-05-22 DIAGNOSIS — R109 Unspecified abdominal pain: Secondary | ICD-10-CM

## 2023-05-22 DIAGNOSIS — R1011 Right upper quadrant pain: Secondary | ICD-10-CM | POA: Insufficient documentation

## 2023-05-22 LAB — CBC
HCT: 36.7 % — ABNORMAL LOW (ref 39.0–52.0)
Hemoglobin: 12 g/dL — ABNORMAL LOW (ref 13.0–17.0)
MCH: 29.8 pg (ref 26.0–34.0)
MCHC: 32.7 g/dL (ref 30.0–36.0)
MCV: 91.1 fL (ref 80.0–100.0)
Platelets: 271 10*3/uL (ref 150–400)
RBC: 4.03 MIL/uL — ABNORMAL LOW (ref 4.22–5.81)
RDW: 14.5 % (ref 11.5–15.5)
WBC: 7.6 10*3/uL (ref 4.0–10.5)
nRBC: 0 % (ref 0.0–0.2)

## 2023-05-22 LAB — COMPREHENSIVE METABOLIC PANEL
ALT: 65 U/L — ABNORMAL HIGH (ref 0–44)
AST: 58 U/L — ABNORMAL HIGH (ref 15–41)
Albumin: 4 g/dL (ref 3.5–5.0)
Alkaline Phosphatase: 122 U/L (ref 38–126)
Anion gap: 9 (ref 5–15)
BUN: 17 mg/dL (ref 6–20)
CO2: 25 mmol/L (ref 22–32)
Calcium: 9.1 mg/dL (ref 8.9–10.3)
Chloride: 104 mmol/L (ref 98–111)
Creatinine, Ser: 0.97 mg/dL (ref 0.61–1.24)
GFR, Estimated: >60 mL/min (ref >60)
Glucose, Bld: 106 mg/dL — ABNORMAL HIGH (ref 70–99)
Potassium: 3.6 mmol/L (ref 3.5–5.1)
Sodium: 138 mmol/L (ref 135–145)
Total Bilirubin: 0.7 mg/dL (ref 0.3–1.2)
Total Protein: 7.9 g/dL (ref 6.5–8.1)

## 2023-05-22 LAB — URINALYSIS, ROUTINE W REFLEX MICROSCOPIC
Bilirubin Urine: NEGATIVE
Glucose, UA: NEGATIVE mg/dL
Hgb urine dipstick: NEGATIVE
Ketones, ur: NEGATIVE mg/dL
Leukocytes,Ua: NEGATIVE
Nitrite: NEGATIVE
Protein, ur: NEGATIVE mg/dL
Specific Gravity, Urine: 1.012 (ref 1.005–1.030)
pH: 7 (ref 5.0–8.0)

## 2023-05-22 LAB — LIPASE, BLOOD: Lipase: 24 U/L (ref 11–51)

## 2023-05-22 MED ORDER — NAPROXEN 500 MG PO TABS: 500.0000 mg | ORAL_TABLET | Freq: Two times a day (BID) | ORAL | 0 refills | Status: AC

## 2023-05-22 MED ORDER — ONDANSETRON HCL 4 MG/2ML IJ SOLN
4.0000 mg | Freq: Once | INTRAMUSCULAR | Status: AC
  Administered 2023-05-22: 4 mg via INTRAVENOUS
  Filled 2023-05-22: qty 2

## 2023-05-22 MED ORDER — SODIUM CHLORIDE 0.9 % IV BOLUS
1000.0000 mL | Freq: Once | INTRAVENOUS | Status: AC
  Administered 2023-05-22: 1000 mL via INTRAVENOUS

## 2023-05-22 MED ORDER — KETOROLAC TROMETHAMINE 30 MG/ML IJ SOLN
15.0000 mg | Freq: Once | INTRAMUSCULAR | Status: AC
  Administered 2023-05-22: 15 mg via INTRAVENOUS
  Filled 2023-05-22: qty 1

## 2023-05-22 MED ORDER — ONDANSETRON 4 MG PO TBDP: 4.0000 mg | ORAL_TABLET | Freq: Three times a day (TID) | ORAL | 0 refills | Status: AC | PRN

## 2023-05-22 NOTE — Discharge Instructions (Signed)
You may take pain and nausea medicines as needed.  Drink plenty of fluids daily.  Return to the ER for worsening symptoms, persistent vomiting, fever or other concerns.

## 2023-05-22 NOTE — ED Triage Notes (Signed)
Pt reports right sided abdominal pain that radiates to flank on and off for the last several weeks.

## 2023-05-22 NOTE — ED Provider Notes (Signed)
[facility] Provider Note    Event Date/Time   First MD Initiated Contact with Patient 05/22/23 [id number]     (approximate)   History   Abdominal Pain and Flank Pain   HPI  David Leach is a 47 y.o. male who presents to the ED from home with a chief complaint of right flank/abdominal pain for "a while", worsening tonight.  Symptoms associated with nausea without vomiting.  Denies associated fever/chills, chest pain, shortness of breath, dysuria, hematuria, testicular pain or swelling.     Past Medical History   Past Medical History:  Diagnosis Date   AKI (acute kidney injury) (HCC) 12/16/2021   Altered mental status 12/16/2021   Asthma    Chronic dental pain    Chronic shoulder pain    Drug abuse (HCC)    DVT (deep venous thrombosis) (HCC)    Headache    Pulmonary embolism (HCC)    Right-sided back pain      Active Problem List   Patient Active Problem List   Diagnosis Date Noted   Aspiration pneumonia (HCC) 12/17/2021   Acute respiratory failure with hypoxia (HCC) 12/16/2021   Chronic right-sided thoracic back pain 12/16/2021   Syncope 12/16/2021   Normocytic anemia 09/19/2020   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 Authorizing Provider  acetaminophen (TYLENOL) 325 MG tablet Take 650 mg by mouth every 6 (six) hours as needed. Patient not taking: Reported on 12/23/2022    [provider]  albuterol (PROAIR HFA) 108 (90 Base) MCG/ACT inhaler Inhale 2 puffs into the lungs every  4 (four) hours as needed for wheezing or shortness of breath. 12/21/21   Pennie Banter, DO  Ascorbic Acid (VITAMIN C) 1000 MG tablet Take 1,000 mg by mouth daily. Patient not taking: Reported on 12/23/2022    [provider]  ibuprofen (ADVIL) 200 MG tablet Take 400 mg by mouth every 6 (six) hours as needed. Patient not taking: Reported on 12/23/2022    [provider]  vitamin B-12 (CYANOCOBALAMIN) 500 MCG tablet Take 500 mcg by mouth daily. Patient not taking: Reported on 12/23/2022    [provider]     Allergies  Hydrocodone   Family History   Family History  Problem Relation Age of Onset   Cancer Mother    Diabetes Father    Cancer Other    Stroke Other    Diabetes Other      Physical Exam  Triage Vital Signs: ED Triage Vitals  Encounter Vitals Group     BP 05/22/23 0529 (!) 132/91     Systolic BP Percentile --      Diastolic BP Percentile --      Pulse Rate 05/22/23 0529 94     Resp 05/22/23 0529 18     Temp 05/22/23 0528 97.6 F (36.4 C)     Temp Source 05/22/23 0528 Oral     SpO2 05/22/23 0529 98 %     Weight --  Height 05/22/23 0527 5\' 11"  (1.803 m)     Head Circumference --      Peak Flow --      Pain Score 05/22/23 0527 8     Pain Loc --      Pain Education --      Exclude from Growth Chart --     Updated Vital Signs: BP (!) 123/99 (BP Location: Left Arm)   Pulse 91   Temp 97.6 F (36.4 C) (Oral)   Resp 20   Ht 5\' 11"  (1.803 m)   SpO2 100%   BMI 27.20 kg/m    General: Awake, mild distress.  CV:  RRR.  Good peripheral perfusion.  Resp:  Normal effort.  CTAB. Abd:  Nontender, minimal right CVAT.  No distention.  Other:  No truncal vesicles.   ED Results / Procedures / Treatments  Labs (all labs ordered are listed, but only abnormal results are displayed) Labs Reviewed  COMPREHENSIVE METABOLIC PANEL - Abnormal; Notable for the following components:      Result Value   Glucose, Bld 106 (*)    AST 58 (*)     ALT 65 (*)    All other components within normal limits  CBC - Abnormal; Notable for the following components:   RBC 4.03 (*)    Hemoglobin 12.0 (*)    HCT 36.7 (*)    All other components within normal limits  URINALYSIS, ROUTINE W REFLEX MICROSCOPIC - Abnormal; Notable for the following components:   Color, Urine YELLOW (*)    APPearance CLEAR (*)    All other components within normal limits  LIPASE, BLOOD     EKG  None   RADIOLOGY I have independently visualized and interpreted patient's imaging study as well as noted the radiology interpretation:  CT renal colic: No acute abnormalities  Official radiology report(s): CT Renal Stone Study  Result Date: 05/22/2023 CLINICAL DATA:  47 year old male with history of abdominal and flank pain. Suspected stone. EXAM: CT ABDOMEN AND PELVIS WITHOUT CONTRAST TECHNIQUE: Multidetector CT imaging of the abdomen and pelvis was performed following the standard protocol without IV contrast. RADIATION DOSE REDUCTION: This exam was performed according to the departmental dose-optimization program which includes automated exposure control, adjustment of the mA and/or kV according to patient size and/or use of iterative reconstruction technique. COMPARISON:  CT of the abdomen and pelvis 12/16/2021. FINDINGS: Comment: Portions of today's examination are limited by considerable patient respiratory motion. Lower chest: Unremarkable. Hepatobiliary: No suspicious cystic or solid hepatic lesions are confidently identified on today's noncontrast CT examination. Gallbladder is nearly decompressed, and otherwise unremarkable in appearance. Pancreas: No definite pancreatic mass or peripancreatic fluid collections or inflammatory changes are noted on today's noncontrast CT examination. Spleen: Unremarkable. Adrenals/Urinary Tract: There are no abnormal calcifications within the collecting system of either kidney, along the course of either ureter, or within the  lumen of the urinary bladder. No hydroureteronephrosis or perinephric stranding to suggest urinary tract obstruction at this time. The unenhanced appearance of the kidneys is unremarkable bilaterally. Urinary bladder is nearly decompressed, but otherwise unremarkable in appearance. Bilateral adrenal glands are normal in appearance. Stomach/Bowel: Unenhanced appearance of the stomach is unremarkable. No pathologic dilatation of small bowel or colon. Normal appendix. Vascular/Lymphatic: Atherosclerotic calcifications in the abdominal aorta and pelvic vasculature. No lymphadenopathy noted in the abdomen or pelvis. Reproductive: Prostate gland and seminal vesicles are unremarkable in appearance. Other: No significant volume of ascites.  No pneumoperitoneum. Musculoskeletal: There are no aggressive appearing lytic  or blastic lesions noted in the visualized portions of the skeleton. IMPRESSION: 1. No acute findings are noted in the abdomen or pelvis to account for the patient's symptoms. Specifically, no urinary tract calculi no findings of urinary tract obstruction. 2. Aortic atherosclerosis. Electronically Signed   By: Trudie Reed M.D.   On: 05/22/2023 06:39     PROCEDURES:  Critical Care performed: No  Procedures   MEDICATIONS ORDERED IN ED: Medications  sodium chloride 0.9 % bolus 1,000 mL (1,000 mLs Intravenous New Bag/Given 05/22/23 0626)  ketorolac (TORADOL) 30 MG/ML injection 15 mg (15 mg Intravenous Given 05/22/23 0627)  ondansetron (ZOFRAN) injection 4 mg (4 mg Intravenous Given 05/22/23 1610)     IMPRESSION / MDM / ASSESSMENT AND PLAN / ED COURSE  I reviewed the triage vital signs and the nursing notes.                             47 year old male who presents with right flank/abdominal pain for some time, worsening tonight. Differential diagnosis includes, but is not limited to, acute appendicitis, renal colic, testicular torsion, urinary tract infection/pyelonephritis, prostatitis,   epididymitis, diverticulitis, small bowel obstruction or ileus, colitis, abdominal aortic aneurysm, gastroenteritis, hernia, etc. I personally reviewed patient's records and note an ID office visit on 12/23/2022 for STD screening.  Patient's presentation is most consistent with acute presentation with potential threat to life or bodily function.  Laboratory results demonstrate normal WBC 7.6, normal kidney function, minimally elevated transaminases with normal lipase and bilirubin, negative UA.  Pending results of CT scan.  Administer IV fluid hydration, IV ketorolac for pain, IV Zofran for nausea.  Will reassess. Clinical Course as of 05/22/23 [id number]  Wynelle Link May 22, 2023  [id number] Updated patient and spouse on all laboratory and imaging results.  He is feeling better.  Will discharge home with as needed pain and nausea medicine and he will follow-up with his PCP.  Strict return precautions given.  Patient verbalizes understanding and agrees with plan of care. [JS]    Clinical Course User Index [JS] Irean Hong, MD     FINAL CLINICAL IMPRESSION(S) / ED DIAGNOSES   Final diagnoses:  Right flank pain  Right upper quadrant abdominal pain     Rx / DC Orders   ED Discharge Orders     None        Note:  This document was prepared using Dragon voice recognition software and may include unintentional dictation errors.   Irean Hong, MD 05/22/23 [phone]

## 2023-08-06 ENCOUNTER — Other Ambulatory Visit: Payer: Self-pay

## 2023-08-06 ENCOUNTER — Emergency Department
Admission: EM | Admit: 2023-08-06 | Discharge: 2023-08-06 | Disposition: A | Discharge status: Home / Self Care | Payer: Self-pay | Attending: Emergency Medicine | Admitting: Emergency Medicine

## 2023-08-06 DIAGNOSIS — J449 Chronic obstructive pulmonary disease, unspecified: Secondary | ICD-10-CM | POA: Insufficient documentation

## 2023-08-06 DIAGNOSIS — X58XXXD Exposure to other specified factors, subsequent encounter: Secondary | ICD-10-CM | POA: Insufficient documentation

## 2023-08-06 DIAGNOSIS — J45909 Unspecified asthma, uncomplicated: Secondary | ICD-10-CM | POA: Insufficient documentation

## 2023-08-06 DIAGNOSIS — Z7901 Long term (current) use of anticoagulants: Secondary | ICD-10-CM | POA: Insufficient documentation

## 2023-08-06 DIAGNOSIS — S0219XD Other fracture of base of skull, subsequent encounter for fracture with routine healing: Secondary | ICD-10-CM | POA: Insufficient documentation

## 2023-08-06 DIAGNOSIS — R52 Pain, unspecified: Secondary | ICD-10-CM | POA: Insufficient documentation

## 2023-08-06 MED ORDER — MORPHINE SULFATE (PF) 4 MG/ML IV SOLN
4.0000 mg | Freq: Once | INTRAVENOUS | Status: AC
  Administered 2023-08-06: 4 mg via INTRAMUSCULAR
  Filled 2023-08-06: qty 1

## 2023-08-06 MED ORDER — OXYCODONE HCL 5 MG PO TABS
5.0000 mg | ORAL_TABLET | Freq: Three times a day (TID) | ORAL | 0 refills | Status: AC | PRN
Start: 2023-08-06 — End: 2024-08-05
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
## 2023-08-06 NOTE — Discharge Instructions (Addendum)
Please use Tylenol first for pain symptoms.  Only for severe pain use oxycodone.  We cannot keep prescribing you additional doses of this and will have to be prescribed by primary care doctor.
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
## 2023-09-06 ENCOUNTER — Other Ambulatory Visit: Payer: Self-pay

## 2023-09-06 DIAGNOSIS — Z5321 Procedure and treatment not carried out due to patient leaving prior to being seen by health care provider: Secondary | ICD-10-CM | POA: Insufficient documentation

## 2023-09-06 DIAGNOSIS — Z4802 Encounter for removal of sutures: Secondary | ICD-10-CM | POA: Insufficient documentation

## 2023-09-06 NOTE — ED Triage Notes (Signed)
 Pt reports he is here to get the stitches taken out of his nose.

## 2023-09-07 ENCOUNTER — Emergency Department
Admission: EM | Admit: 2023-09-07 | Discharge: 2023-09-07 | Discharge status: Against Medical Advice | Payer: Self-pay | Attending: Emergency Medicine | Admitting: Emergency Medicine

## 2023-12-01 ENCOUNTER — Other Ambulatory Visit: Payer: Self-pay

## 2023-12-01 ENCOUNTER — Encounter: Payer: Self-pay | Admitting: Emergency Medicine

## 2023-12-01 ENCOUNTER — Emergency Department
Admission: EM | Admit: 2023-12-01 | Discharge: 2023-12-01 | Discharge status: Against Medical Advice | Attending: Emergency Medicine | Admitting: Emergency Medicine

## 2023-12-01 DIAGNOSIS — R1110 Vomiting, unspecified: Secondary | ICD-10-CM | POA: Diagnosis not present

## 2023-12-01 DIAGNOSIS — R109 Unspecified abdominal pain: Secondary | ICD-10-CM | POA: Diagnosis present

## 2023-12-01 DIAGNOSIS — Z5321 Procedure and treatment not carried out due to patient leaving prior to being seen by health care provider: Secondary | ICD-10-CM | POA: Insufficient documentation

## 2023-12-01 LAB — COMPREHENSIVE METABOLIC PANEL WITH GFR
ALT: 59 U/L — ABNORMAL HIGH (ref 0–44)
AST: 52 U/L — ABNORMAL HIGH (ref 15–41)
Albumin: 3.5 g/dL (ref 3.5–5.0)
Alkaline Phosphatase: 91 U/L (ref 38–126)
Anion gap: 9 (ref 5–15)
BUN: 16 mg/dL (ref 6–20)
CO2: 24 mmol/L (ref 22–32)
Calcium: 8.8 mg/dL — ABNORMAL LOW (ref 8.9–10.3)
Chloride: 105 mmol/L (ref 98–111)
Creatinine, Ser: 1 mg/dL (ref 0.61–1.24)
GFR, Estimated: >60 mL/min (ref >60)
Glucose, Bld: 138 mg/dL — ABNORMAL HIGH (ref 70–99)
Potassium: 4 mmol/L (ref 3.5–5.1)
Sodium: 138 mmol/L (ref 135–145)
Total Bilirubin: 0.6 mg/dL (ref 0.0–1.2)
Total Protein: 6.8 g/dL (ref 6.5–8.1)

## 2023-12-01 LAB — URINALYSIS, ROUTINE W REFLEX MICROSCOPIC
Bilirubin Urine: NEGATIVE
Glucose, UA: NEGATIVE mg/dL
Hgb urine dipstick: NEGATIVE
Ketones, ur: NEGATIVE mg/dL
Leukocytes,Ua: NEGATIVE
Nitrite: NEGATIVE
Protein, ur: NEGATIVE mg/dL
Specific Gravity, Urine: 1.011 (ref 1.005–1.030)
pH: 6 (ref 5.0–8.0)

## 2023-12-01 LAB — CBC
HCT: 42 % (ref 39.0–52.0)
Hemoglobin: 13.5 g/dL (ref 13.0–17.0)
MCH: 29.9 pg (ref 26.0–34.0)
MCHC: 32.1 g/dL (ref 30.0–36.0)
MCV: 93.1 fL (ref 80.0–100.0)
Platelets: 245 10*3/uL (ref 150–400)
RBC: 4.51 MIL/uL (ref 4.22–5.81)
RDW: 13.1 % (ref 11.5–15.5)
WBC: 6.2 10*3/uL (ref 4.0–10.5)
nRBC: 0 % (ref 0.0–0.2)

## 2023-12-01 LAB — LIPASE, BLOOD: Lipase: 23 U/L (ref 11–51)

## 2023-12-01 NOTE — ED Triage Notes (Signed)
 Pt reports abdominal pain and vomiting since Sunday. Reports unable to eat without abdominal pain or vomiting.   Denies sick contacts. Denies chest pain, SOB, diarrhea.

## 2023-12-01 NOTE — ED Notes (Signed)
 Pt states he is not waiting any longer

## 2024-01-09 ENCOUNTER — Ambulatory Visit: Payer: Self-pay | Admitting: Nurse Practitioner

## 2024-01-09 ENCOUNTER — Encounter: Payer: Self-pay | Admitting: Family Medicine

## 2024-01-09 DIAGNOSIS — K739 Chronic hepatitis, unspecified: Secondary | ICD-10-CM

## 2024-01-09 DIAGNOSIS — Z113 Encounter for screening for infections with a predominantly sexual mode of transmission: Secondary | ICD-10-CM
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
## 2024-01-11 LAB — HBV ANTIGEN/ANTIBODY STATE LAB
Hep B Core Total Ab: NONREACTIVE
Hep B S Ab: REACTIVE
Hepatitis B Surface Antigen: NONREACTIVE

## 2024-01-11 LAB — HIV/HCV ~~LOC~~ LAB
HIV Ag/Ab Combo: NONREACTIVE
Hepatitis C Ab: REACTIVE
Hepatitis C Quantitation: 3607288
Hepatitis C Quantitation: 6.56
Hepatitis C RNA-PCR: DETECTED

## 2024-01-11 LAB — CHLAMYDIA/GC NAA, CONFIRMATION
Chlamydia trachomatis, NAA: NEGATIVE
Neisseria gonorrhoeae, NAA: NEGATIVE

## 2024-01-14 LAB — GONOCOCCUS CULTURE

## 2024-01-17 ENCOUNTER — Telehealth: Payer: Self-pay

## 2024-01-17 DIAGNOSIS — B182 Chronic viral hepatitis C: Secondary | ICD-10-CM

## 2024-01-17 NOTE — Telephone Encounter (Signed)
 Contacted pt to inform of lab test results from 01/09/24. Noted to have positive test results that indicate active infection.Educated/counseled. Advised needs to be referred to provider for treatment. Pt was told in 2024 active infection and recommended seeking treatment. Pt states has not had treatment. Gave contact info for [facility] and also asked if could send referral to [facility]. Faxing referral info to [facility].

## 2024-05-06 ENCOUNTER — Other Ambulatory Visit: Payer: Self-pay

## 2024-05-06 ENCOUNTER — Emergency Department
Admission: EM | Admit: 2024-05-06 | Discharge: 2024-05-06 | Disposition: A | Discharge status: Home / Self Care | Payer: Self-pay | Attending: Emergency Medicine | Admitting: Emergency Medicine

## 2024-05-06 DIAGNOSIS — X58XXXA Exposure to other specified factors, initial encounter: Secondary | ICD-10-CM | POA: Insufficient documentation

## 2024-05-06 DIAGNOSIS — T426X1A Poisoning by other antiepileptic and sedative-hypnotic drugs, accidental (unintentional), initial encounter: Secondary | ICD-10-CM | POA: Insufficient documentation

## 2024-05-06 DIAGNOSIS — T50901A Poisoning by unspecified drugs, medicaments and biological substances, accidental (unintentional), initial encounter: Secondary | ICD-10-CM

## 2024-05-06 LAB — COMPREHENSIVE METABOLIC PANEL WITH GFR
ALT: 44 U/L (ref 0–44)
AST: 53 U/L — ABNORMAL HIGH (ref 15–41)
Albumin: 3.9 g/dL (ref 3.5–5.0)
Alkaline Phosphatase: 99 U/L (ref 38–126)
Anion gap: 15 (ref 5–15)
BUN: 27 mg/dL — ABNORMAL HIGH (ref 6–20)
CO2: 20 mmol/L — ABNORMAL LOW (ref 22–32)
Calcium: 8.6 mg/dL — ABNORMAL LOW (ref 8.9–10.3)
Chloride: 102 mmol/L (ref 98–111)
Creatinine, Ser: 1.51 mg/dL — ABNORMAL HIGH (ref 0.61–1.24)
GFR, Estimated: 57 mL/min — ABNORMAL LOW (ref >60)
Glucose, Bld: 173 mg/dL — ABNORMAL HIGH (ref 70–99)
Potassium: 3.6 mmol/L (ref 3.5–5.1)
Sodium: 137 mmol/L (ref 135–145)
Total Bilirubin: 0.6 mg/dL (ref 0.0–1.2)
Total Protein: 7.6 g/dL (ref 6.5–8.1)

## 2024-05-06 LAB — URINE DRUG SCREEN, QUALITATIVE (ARMC ONLY)
Amphetamines, Ur Screen: NOT DETECTED
Barbiturates, Ur Screen: NOT DETECTED
Benzodiazepine, Ur Scrn: NOT DETECTED
Cannabinoid 50 Ng, Ur ~~LOC~~: NOT DETECTED
Cocaine Metabolite,Ur ~~LOC~~: POSITIVE — AB
MDMA (Ecstasy)Ur Screen: NOT DETECTED
Methadone Scn, Ur: NOT DETECTED
Opiate, Ur Screen: POSITIVE — AB
Phencyclidine (PCP) Ur S: NOT DETECTED
Tricyclic, Ur Screen: NOT DETECTED

## 2024-05-06 LAB — CBC
HCT: 37.6 % — ABNORMAL LOW (ref 39.0–52.0)
Hemoglobin: 12.3 g/dL — ABNORMAL LOW (ref 13.0–17.0)
MCH: 31 pg (ref 26.0–34.0)
MCHC: 32.7 g/dL (ref 30.0–36.0)
MCV: 94.7 fL (ref 80.0–100.0)
Platelets: 244 K/uL (ref 150–400)
RBC: 3.97 MIL/uL — ABNORMAL LOW (ref 4.22–5.81)
RDW: 13.8 % (ref 11.5–15.5)
WBC: 11.7 K/uL — ABNORMAL HIGH (ref 4.0–10.5)
nRBC: 0 % (ref 0.0–0.2)

## 2024-05-06 LAB — ETHANOL: Alcohol, Ethyl (B): <15 mg/dL (ref <15)

## 2024-05-06 MED ORDER — ONDANSETRON HCL 4 MG/2ML IJ SOLN
4.0000 mg | Freq: Once | INTRAMUSCULAR | Status: AC
  Administered 2024-05-06: 4 mg via INTRAVENOUS
  Filled 2024-05-06: qty 2

## 2024-05-06 MED ORDER — SODIUM CHLORIDE 0.9 % IV BOLUS
1000.0000 mL | Freq: Once | INTRAVENOUS | Status: AC
  Administered 2024-05-06: 1000 mL via INTRAVENOUS

## 2024-05-06 MED ORDER — NALOXONE HCL 2 MG/2ML IJ SOSY
1.0000 mg | PREFILLED_SYRINGE | Freq: Once | INTRAMUSCULAR | Status: AC
  Administered 2024-05-06: 1 mg via INTRAVENOUS
  Filled 2024-05-06: qty 2

## 2024-05-06 NOTE — ED Notes (Signed)
 Pt provided with food, okay per [facility] MD

## 2024-05-06 NOTE — ED Notes (Signed)
Bed alarm activated at this time.

## 2024-05-06 NOTE — ED Triage Notes (Signed)
 Pt BIB EMS after an overdose on fentanyl  and gabapentin. Pt is alert and oriented x4. Pt's speech is slow and slurred at this this time. Per EMS ETOH was also involved.

## 2024-05-06 NOTE — ED Notes (Signed)
 Pt able to answer all questions appropriately. Pt very drowsy. Pt connected to monitoring devices. Pt has call bell in reach.

## 2024-05-06 NOTE — ED Provider Notes (Signed)
 [facility] Provider Note    Event Date/Time   First MD Initiated Contact with Patient 05/06/24 [phone]     (approximate)  History   Chief Complaint: Overdose  HPI  David Leach is a 48 y.o. male with a past medical history of substance abuse who presents to the emergency department after an accidental overdose.  According to the patient him and his significant other snorted a pill which he thought was gabapentin.  Shortly after snorting the pill the significant other went unresponsive and he called EMS.  EMS states upon their arrival the significant other was apneic and responded very well to Narcan .  They state the patient was still alert and able to speak although with slurred speech and appeared intoxicated.  Did not receive any medications prior to hospital arrival.  Here the patient is awake and alert he is able to answer simple questions however he is somnolent will fall asleep if not actively engaged, slurred and slowed speech.  Patient does admit to snorting a pill earlier as well as drinking some alcohol.  Physical Exam   Triage Vital Signs: ED Triage Vitals  Encounter Vitals Group     BP      Girls Systolic BP Percentile      Girls Diastolic BP Percentile      Boys Systolic BP Percentile      Boys Diastolic BP Percentile      Pulse      Resp      Temp      Temp src      SpO2      Weight      Height      Head Circumference      Peak Flow      Pain Score      Pain Loc      Pain Education      Exclude from Growth Chart     Most recent vital signs: There were no vitals filed for this visit.  General: Awake, no distress.  Unequal pupils with a right pupil defect, patient states this is chronic. CV:  Good peripheral perfusion.  Regular rate and rhythm  Resp:  Normal effort.  Equal breath sounds bilaterally.  Abd:  No distention.  Soft, nontender.    ED Results / Procedures / Treatments   MEDICATIONS ORDERED IN ED: Medications   naloxone  (NARCAN ) injection 1 mg (has no administration in time range)  sodium chloride  0.9 % bolus 1,000 mL (has no administration in time range)  ondansetron  (ZOFRAN ) injection 4 mg (has no administration in time range)     IMPRESSION / MDM / ASSESSMENT AND PLAN / ED COURSE  I reviewed the triage vital signs and the nursing notes.  Patient's presentation is most consistent with acute presentation with potential threat to life or bodily function.  Patient presents to the emergency department after an accidental overdose.  Patient admits to snorting a pill of some sort which he thought was gabapentin, also admits to alcohol.  Patient's significant other who was transported to the hospital at the same time as the patient was unresponsive but had a significant response to Narcan .  Here the patient is awake but falls asleep if not actively engaged, has slowed and slurred speech.  Will dose 1 mg of Narcan , IV fluids we will check labs and continue to closely monitor.  Patient's workup has resulted showing an overall reassuring CBC, no significant findings on chemistry alcohol level is negative.  Urine drug screen is positive for cocaine and opioids.  Patient is awake alert sitting in bed watching TV.  Vital signs reassuring.  Physical exam reassuring.  He has been in the emergency department over 3 hours at this time.  Will discharge from the emergency department.  Will provide outpatient resources for the patient  FINAL CLINICAL IMPRESSION(S) / ED DIAGNOSES   Accidental drug overdose  Note:  This document was prepared using Dragon voice recognition software and may include unintentional dictation errors.   Dorothyann Drivers, MD 05/06/24 [phone]
